# Patient Record
Sex: Male | Born: 1985
Health system: Southern US, Community
[De-identification: ages and names within clinical notes are randomized; demographics above are authoritative.]

## PROBLEM LIST (undated history)

## (undated) DIAGNOSIS — T7840XA Allergy, unspecified, initial encounter: Secondary | ICD-10-CM

## (undated) DIAGNOSIS — R61 Generalized hyperhidrosis: Secondary | ICD-10-CM

## (undated) DIAGNOSIS — F988 Other specified behavioral and emotional disorders with onset usually occurring in childhood and adolescence: Secondary | ICD-10-CM

## (undated) HISTORY — DX: Other specified behavioral and emotional disorders with onset usually occurring in childhood and adolescence: F98.8

## (undated) HISTORY — DX: Generalized hyperhidrosis: R61

## (undated) HISTORY — PX: ADENOIDECTOMY: SUR15

## (undated) HISTORY — DX: Allergy, unspecified, initial encounter: T78.40XA

---
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
## 2008-05-18 ENCOUNTER — Ambulatory Visit: Payer: Self-pay | Admitting: Internal Medicine

## 2008-10-15 ENCOUNTER — Ambulatory Visit: Payer: Self-pay | Admitting: Internal Medicine

## 2008-10-30 ENCOUNTER — Ambulatory Visit: Payer: Self-pay | Admitting: Internal Medicine

## 2008-12-17 ENCOUNTER — Ambulatory Visit: Payer: Self-pay | Admitting: Internal Medicine

## 2009-01-08 ENCOUNTER — Ambulatory Visit: Payer: Self-pay | Admitting: Internal Medicine

## 2009-04-23 ENCOUNTER — Ambulatory Visit: Payer: Self-pay | Admitting: Internal Medicine

## 2009-04-25 ENCOUNTER — Ambulatory Visit: Payer: Self-pay | Admitting: Internal Medicine

## 2009-06-18 ENCOUNTER — Ambulatory Visit: Payer: Self-pay | Admitting: Internal Medicine

## 2009-09-17 ENCOUNTER — Ambulatory Visit: Payer: Self-pay | Admitting: Internal Medicine

## 2009-10-11 ENCOUNTER — Ambulatory Visit: Payer: Self-pay | Admitting: Internal Medicine

## 2010-05-08 ENCOUNTER — Ambulatory Visit: Payer: Self-pay | Admitting: Internal Medicine

## 2010-07-21 ENCOUNTER — Ambulatory Visit: Payer: Self-pay | Admitting: Internal Medicine

## 2011-07-23 ENCOUNTER — Encounter: Payer: Self-pay | Admitting: Internal Medicine

## 2011-07-23 ENCOUNTER — Ambulatory Visit (INDEPENDENT_AMBULATORY_CARE_PROVIDER_SITE_OTHER): Payer: 59 | Admitting: Internal Medicine

## 2011-07-23 VITALS — BP 106/66 | Temp 98.3°F | Ht 73.0 in | Wt 148.0 lb

## 2011-07-23 DIAGNOSIS — H6690 Otitis media, unspecified, unspecified ear: Secondary | ICD-10-CM

## 2011-07-23 DIAGNOSIS — Z23 Encounter for immunization: Secondary | ICD-10-CM

## 2011-07-23 DIAGNOSIS — R61 Generalized hyperhidrosis: Secondary | ICD-10-CM

## 2011-07-23 DIAGNOSIS — L74519 Primary focal hyperhidrosis, unspecified: Secondary | ICD-10-CM

## 2011-07-23 DIAGNOSIS — H6691 Otitis media, unspecified, right ear: Secondary | ICD-10-CM

## 2011-07-23 DIAGNOSIS — J069 Acute upper respiratory infection, unspecified: Secondary | ICD-10-CM

## 2011-08-01 ENCOUNTER — Encounter: Payer: Self-pay | Admitting: Internal Medicine

## 2011-08-01 DIAGNOSIS — R61 Generalized hyperhidrosis: Secondary | ICD-10-CM | POA: Insufficient documentation

## 2011-08-01 NOTE — Progress Notes (Signed)
  Subjective:    Patient ID: Derek Jennings, male    DOB: Feb 03, 1986, 25 y.o.   MRN: [id number]  HPI 25 year old black male in today complaining of URI symptoms and right ear pain. Has been ill for 2 or 3 days. Slight sore throat. No fever or shaking chills. Has not had influenza immunization.    Review of Systems     Objective:   Physical Exam HEENT exam: Right TM is red and full left TM slightly full but not red; pharynx slightly injected; neck supple ; chest clear        Assessment & Plan:  Right otitis media  URI  Plan: Zithromax Z-Pak take 2 by mouth day one followed by 1 by mouth days 2 through 5. Patient also says he's had recent STD check and an urgent care and we will request those results. This was done at [facility] Addendum: Received these results from [facility]. Had negative HIV, Chlamydia and gonorrhea tests

## 2011-08-01 NOTE — Patient Instructions (Signed)
Take Zithromax Z-PAK  2 tablets by mouth day one followed by 1 tablet by mouth days 2 through 5. Call if not better next week or sooner if worse. Influenza immunization given today.

## 2011-08-10 ENCOUNTER — Encounter: Payer: Self-pay | Admitting: Internal Medicine

## 2011-08-13 ENCOUNTER — Ambulatory Visit (INDEPENDENT_AMBULATORY_CARE_PROVIDER_SITE_OTHER): Payer: 59 | Admitting: Internal Medicine

## 2011-08-13 ENCOUNTER — Encounter: Payer: Self-pay | Admitting: Internal Medicine

## 2011-08-13 VITALS — BP 110/74 | HR 76 | Temp 97.8°F | Ht 73.0 in | Wt 150.0 lb

## 2011-08-13 DIAGNOSIS — J069 Acute upper respiratory infection, unspecified: Secondary | ICD-10-CM

## 2011-08-13 DIAGNOSIS — H6690 Otitis media, unspecified, unspecified ear: Secondary | ICD-10-CM

## 2011-08-13 NOTE — Patient Instructions (Signed)
Take Levaquin daily with a meal for 10 days. Call if not better in one week.

## 2011-08-13 NOTE — Progress Notes (Signed)
  Subjective:    Patient ID: Derek Jennings, male    DOB: 1985-10-24, 26 y.o.   MRN: [id number]  HPI 26 year old Black male who was here July 23, 2011 with URI and ear infection. He  was treated with a Zithromax Z-Pak at that time. Says he is not well. Still has nasal congestion and right ear pain. No fever or chills. No sputum production.    Review of Systems noncontributory     Objective:   Physical Exam    HEENT exam: TMs are chronically scarred but not red.    Pharynx is slightly injected without exudate;   Neck is supple without adenopathy;  Chest is clear to auscultation.      Assessment & Plan:  URI-protracted   Bilateral otitis media  Plan: Levaquin 500 milligrams daily for 10 days. He is inquiring about his results from [facility] which were sent to this office. He had negative STD check and negative HIV test done at [facility].

## 2011-08-20 ENCOUNTER — Other Ambulatory Visit: Payer: Self-pay | Admitting: Internal Medicine

## 2011-08-21 ENCOUNTER — Encounter: Payer: Self-pay | Admitting: Internal Medicine

## 2011-09-28 ENCOUNTER — Encounter: Payer: Self-pay | Admitting: Internal Medicine

## 2011-09-28 ENCOUNTER — Ambulatory Visit (INDEPENDENT_AMBULATORY_CARE_PROVIDER_SITE_OTHER): Payer: 59 | Admitting: Internal Medicine

## 2011-09-28 VITALS — BP 116/64 | Temp 98.6°F | Wt 143.5 lb

## 2011-09-28 DIAGNOSIS — H6691 Otitis media, unspecified, right ear: Secondary | ICD-10-CM

## 2011-09-28 DIAGNOSIS — H6690 Otitis media, unspecified, unspecified ear: Secondary | ICD-10-CM

## 2011-09-28 NOTE — Patient Instructions (Addendum)
Take Levaquin 500 milligrams daily for 10 days. Call if not better in 7-10 days or sooner if worse. 

## 2011-09-28 NOTE — Progress Notes (Signed)
  Subjective:    Patient ID: Derek Jennings, male    DOB: Nov 16, 1985, 26 y.o.   MRN: [id number]  HPI  Patient was here in December with ear infection and was treated with Zithromax Z-Pak. He returned on January 10 with persistent symptoms. He was treated with Levaquin 500 mg daily for 10 days. He did get better. Apparently came down with recurrent symptoms on February 23. Has had pain in left ear. No sore throat. Has had some postnasal drip. History of allergic rhinitis and recurrent ear infections since childhood. No fever or shaking chills. Not much cough. Has been taking Mucinex over-the-counter.    Review of Systems     Objective:   Physical Exam Right TM is pink, full and dull with splayed light reflex. Left TM is full but not red. Pharynx is clear. Neck is supple without adenopathy. Chest clear to auscultation.        Assessment & Plan:  Right otitis media  URI  Plan: Levaquin 500 milligrams daily for 10 days.

## 2011-12-03 ENCOUNTER — Ambulatory Visit: Payer: 59 | Admitting: Internal Medicine

## 2011-12-10 ENCOUNTER — Ambulatory Visit: Payer: 59 | Admitting: Internal Medicine

## 2011-12-11 ENCOUNTER — Encounter: Payer: Self-pay | Admitting: Internal Medicine

## 2011-12-11 ENCOUNTER — Ambulatory Visit (INDEPENDENT_AMBULATORY_CARE_PROVIDER_SITE_OTHER): Payer: 59 | Admitting: Internal Medicine

## 2011-12-11 DIAGNOSIS — K645 Perianal venous thrombosis: Secondary | ICD-10-CM

## 2011-12-11 DIAGNOSIS — H1045 Other chronic allergic conjunctivitis: Secondary | ICD-10-CM

## 2011-12-11 DIAGNOSIS — J309 Allergic rhinitis, unspecified: Secondary | ICD-10-CM | POA: Insufficient documentation

## 2011-12-11 DIAGNOSIS — H1010 Acute atopic conjunctivitis, unspecified eye: Secondary | ICD-10-CM

## 2011-12-11 NOTE — Patient Instructions (Addendum)
Use ProctoCream and rectal area 4 times a day. Take sitz baths daily for 20 minutes. You have been prescribed a six-day Sterapred DS dosepak for allergy symptoms.

## 2011-12-11 NOTE — Progress Notes (Signed)
  Subjective:    Patient ID: Derek Jennings, male    DOB: [birth year], 24 y.o.   MRN: [id number]  HPI 26 year old black male complaining of allergic rhinitis symptoms. No ear pain. Just stuffy nose. Has itchy eyes as well. Also has another problem today. Has felt a lump in his rectal area. Thinks it may be an external hemorrhoid. Was a bit constipated and strained that the stool a few days ago and this developed. No bleeding from rectal area.    Review of Systems     Objective:   Physical Exam he has very boggy nasal mucosa bilaterally. TMs are clear. Neck is supple without adenopathy. Chest clear. Rectal exam: Has a prominent soft external hemorrhoid that is not painful to touch        Assessment & Plan:  Allergic rhinitis  External hemorrhoid  Plan: ProctoCream-HC to use in the rectal area 4 times a day along with sitz baths daily for 20 minutes  Sterapred DS 10 mg 6 day dosepak for allergic rhinitis symptoms.

## 2012-03-22 ENCOUNTER — Encounter: Payer: Self-pay | Admitting: Internal Medicine

## 2012-03-22 ENCOUNTER — Ambulatory Visit (INDEPENDENT_AMBULATORY_CARE_PROVIDER_SITE_OTHER): Payer: 59 | Admitting: Internal Medicine

## 2012-03-22 ENCOUNTER — Encounter (INDEPENDENT_AMBULATORY_CARE_PROVIDER_SITE_OTHER): Payer: Self-pay | Admitting: Surgery

## 2012-03-22 ENCOUNTER — Ambulatory Visit (INDEPENDENT_AMBULATORY_CARE_PROVIDER_SITE_OTHER): Payer: 59 | Admitting: Surgery

## 2012-03-22 VITALS — BP 102/60 | HR 64 | Temp 97.6°F | Resp 16 | Ht 74.0 in | Wt 150.6 lb

## 2012-03-22 VITALS — BP 118/78 | Temp 98.0°F | Ht 73.0 in | Wt 152.0 lb

## 2012-03-22 DIAGNOSIS — K645 Perianal venous thrombosis: Secondary | ICD-10-CM

## 2012-03-22 MED ORDER — HYDROCODONE-ACETAMINOPHEN 5-325 MG PO TABS: 1.0000 | ORAL_TABLET | Freq: Four times a day (QID) | ORAL | Status: AC | PRN

## 2012-03-22 NOTE — Progress Notes (Signed)
  Subjective:    Patient ID: Derek Jennings, male    DOB: 07-09-1986, 26 y.o.   MRN: [id number]  HPI 26 year old black male who presented here in May with external hemorrhoid that was nonthrombosed. He was treated with ProctoCream-HC. 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 On Sunday he was on his feet for over 8 hours. When he returned to his car he noticed some tenderness in his rectal area. He has not been constipated. Yesterday he noticed a small protrusion in the rectal area and started applying ProctoCream-HC. Says it has improved just a bit. Has not done sitz baths yet.    Review of Systems     Objective:   Physical Exam he is a large thrombosed hemorrhoid in the rectal area that is tender.        Assessment & Plan:  Thrombosed hemorrhoid  Plan: Refer to surgeon for incision and drainage. Appointment with [facility] this afternoon

## 2012-03-22 NOTE — Progress Notes (Signed)
Patient ID: Derek Jennings, male   DOB: 05-10-1986, 26 y.o.   MRN: [id number]  Chief Complaint  Patient presents with  . Rectal Problems    thrombosed hems    HPI Derek Jennings is a 26 y.o. male.  Pt sent at the request of Dr Lenord Fellers for thrombosed external hemorrhoids.  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 HPI  Past Medical History  Diagnosis Date  . Allergy   . ADD (attention deficit disorder)   . Hyperhidrosis     Past Surgical History  Procedure Date  . Adenoidectomy     Family History  Problem Relation Age of Onset  . Heart disease Father     Social History History  Substance Use Topics  . Smoking status: Current Everyday Smoker -- 0.2 packs/day  . Smokeless tobacco: Former Neurosurgeon  . Alcohol Use: Yes     socially    Allergies  Allergen Reactions  . Amoxicillin Hives    Current Outpatient Prescriptions  Medication Sig Dispense Refill  . cetirizine (ZYRTEC) 10 MG tablet Take 10 mg by mouth daily. Prn only      . glycopyrrolate (ROBINUL) 2 MG tablet Take 2 mg by mouth daily.        Marland Kitchen guaiFENesin (MUCINEX) 600 MG 12 hr tablet Take 1,200 mg by mouth 2 (two) times daily.      Marland Kitchen loratadine (CLARITIN) 10 MG tablet Take 10 mg by mouth daily.      . pramoxine-hydrocortisone (PROCTOCREAM-HC) 1-1 % rectal cream Place rectally 2 (two) times daily.      Marland Kitchen HYDROcodone-acetaminophen (NORCO) 5-325 MG per tablet Take 1 tablet by mouth every 6 (six) hours as needed for pain.  30 tablet  0    Review of Systems Review of Systems  Constitutional: Negative.   HENT: Negative.   Gastrointestinal: Positive for vomiting and anal bleeding. Negative for constipation.  Musculoskeletal: Negative.   Skin: Negative.     Blood pressure 102/60, pulse 64, temperature 97.6 F (36.4 C), temperature source Temporal, resp. rate 16, height 6\' 2"  (1.88 m), weight 150 lb 9.6 oz (68.312 kg).  Physical Exam Physical Exam    Constitutional: He appears well-developed and well-nourished.  HENT:  Head: Normocephalic and atraumatic.  Neck: Normal range of motion. Neck supple.  Genitourinary:         Assessment    Thrombosed external hemorrhoids    Plan    Discussed medical vs surgical management.  Risks,  Pros and cons of each discussed.  He agrees t proceed with excision in the office.  Risks include bleeding,   infection,  More surgery,  Organ and neighboring structure injury.The patient agrees to proceed.  Using betadine as prep,  The anus was prepped in a sterile fashion.  1% LIDOCAINE WITH EPINEPHRINE WAS USED.  This was injected around the complex.  The complex was located in the right lateral position.  The entire complex was excised.  Hemostasis was achieved.  Dry dressing applied.  Pt tolerated procedure well.  Post procedure instructions given and script for norco # 30 given.  Questions answered.  Return as needed.       Abimael Zeiter A. 03/22/2012, 3:45 PM

## 2012-03-22 NOTE — Patient Instructions (Signed)
Hemorrhoidectomy Care After Hemorrhoidectomy is the removal of enlarged (dilated) veins around the rectum. Until the surgical areas are healed, control of pain and avoiding constipation are the greatest challenges for patients.  For as long as 24 hours after receiving an anesthetic (the medication that made you sleep), and while taking narcotic pain relievers, you may feel dizzy, weak and drowsy. For that reason, the following information applies to the first 24-hour period following surgery, and continues for as long as you are taking narcotic pain medications.  Do not drive a car, ride a bicycle, participate in activities in which you could be hurt. Do not take public transportation until you are off narcotic pain medications and until your caregiver says it is okay.   Do not drink alcohol, take tranquilizers, or medications not prescribed or allowed by your surgical caregiver.   Do not sign important papers or contracts for at least 24 hours or while taking narcotic medications.   Have a responsible person with you for 24 hours.  RISKS AND COMPLICATIONS Some problems that may occur following this procedure include:  Infection. A germ starts growing in the tissue surrounding the site operated on. This can usually be treated with antibiotics.   Damage to the rectal sphincter could occur. This is the muscle that opens in your anus to allow a bowel movement. This could cause incontinence. This is uncommon.   Bleeding following surgery can be a complication of almost any surgery. Your surgeon takes every precaution to keep this from happening.   Complications of anesthesia.  HOME CARE INSTRUCTIONS  Avoid straining when having bowel movements.   Avoid heavy lifting (more than 10 pounds (4.5 kilograms)).   Only take over-the-counter or prescription medicines for pain, discomfort, or fever as directed by your caregiver.   Take hot sitz baths for 20 to 30 minutes, 3 to 4 times per day.   To  keep swelling down, apply an ice pack for twenty minutes three to four times per day between sitz baths. Use a towel between your skin and the ice pack. Do not do this if it causes too much discomfort.   Keep anal area clean and dry. Following a bowel movement, you can gently wash the area with tucks (available for purchase at a drugstore) or cotton swabs. Gently pat the area dry. Do not rub the area.   Eat a well balanced diet and drink 6 to 8 glasses of water every day to avoid constipation. A bulk laxative may be also be helpful.  SEEK MEDICAL CARE IF:   You have increasing pain or tenderness near or in the surgical site.   You are unable to eat or drink.   You develop nausea or vomiting.   You develop uncontrolled bleeding such as soaking two to three pads in one hour.   You have constipation, not helped by changing your diet or increasing your fluid intake. Pain medications are a common cause of constipation.   You have pain and redness (inflammation) extending outside the area of your surgery.   You develop an unexplained oral temperature above 102 F (38.9 C), or any other signs of infection.   You have any other questions or concerns following surgery.  Document Released: 10/10/2003 Document Revised: 07/09/2011 Document Reviewed: 01/07/2009 ExitCare Patient Information 2012 [employer]. 

## 2012-03-22 NOTE — Patient Instructions (Addendum)
Go to [facility] this afternoon to have hemorrhoid incised.

## 2012-04-12 ENCOUNTER — Ambulatory Visit (INDEPENDENT_AMBULATORY_CARE_PROVIDER_SITE_OTHER): Payer: 59 | Admitting: Internal Medicine

## 2012-04-12 ENCOUNTER — Encounter: Payer: Self-pay | Admitting: Internal Medicine

## 2012-04-12 VITALS — BP 100/72 | Temp 98.9°F | Ht 74.0 in | Wt 152.0 lb

## 2012-04-12 DIAGNOSIS — H6690 Otitis media, unspecified, unspecified ear: Secondary | ICD-10-CM

## 2012-04-12 DIAGNOSIS — J069 Acute upper respiratory infection, unspecified: Secondary | ICD-10-CM

## 2012-04-12 DIAGNOSIS — H6691 Otitis media, unspecified, right ear: Secondary | ICD-10-CM

## 2012-04-29 NOTE — Progress Notes (Signed)
  Subjective:    Patient ID: [name], male    DOB: 1985/12/06, 26 y.o.   MRN: [id number]  HPI 26 year old black male with history of allergic rhinitis in today complaining of right ear pain and sinus congestion. No fever or chills. No sore throat. History of frequent ear infections since childhood.    Review of Systems     Objective:   Physical Exam HEENT exam reveals a right otitis media. Left TM is full but not red. Right TM full thickened and dull. Pharynx is clear. Neck is supple. Chest clear.           Assessment & Plan:  Right otitis media  URI  Plan: Levaquin 500 milligrams daily for 7 days.

## 2012-04-29 NOTE — Patient Instructions (Addendum)
Take Levaquin 500 milligrams daily for 7 days. Call if not better in 7 days 

## 2012-06-09 ENCOUNTER — Ambulatory Visit: Payer: 59 | Admitting: Internal Medicine

## 2012-06-16 ENCOUNTER — Ambulatory Visit (INDEPENDENT_AMBULATORY_CARE_PROVIDER_SITE_OTHER): Payer: 59 | Admitting: Internal Medicine

## 2012-06-16 ENCOUNTER — Encounter: Payer: Self-pay | Admitting: Internal Medicine

## 2012-06-16 VITALS — BP 98/62 | HR 80 | Temp 97.1°F | Wt 159.0 lb

## 2012-06-16 DIAGNOSIS — H6690 Otitis media, unspecified, unspecified ear: Secondary | ICD-10-CM

## 2012-06-16 DIAGNOSIS — H6691 Otitis media, unspecified, right ear: Secondary | ICD-10-CM

## 2012-06-16 DIAGNOSIS — J069 Acute upper respiratory infection, unspecified: Secondary | ICD-10-CM

## 2012-06-16 NOTE — Patient Instructions (Addendum)
Take Levaquin 500 milligrams daily for 7 days. Call if not better in one week or sooner if worse

## 2012-06-16 NOTE — Progress Notes (Signed)
  Subjective:    Patient ID: Derek Jennings, male    DOB: April 02, 1986, 26 y.o.   MRN: [id number]  HPI One-week history of URI symptoms with sore throat and ear pain. No fever or shaking chills. Says right ear hurts.    Review of Systems     Objective:   Physical Exam HEENT exam: Pharynx is injected without exudate. Right TM is full and pain. Left TM is clear. Neck is supple without adenopathy. Chest clear. Skin is warm and dry.        Assessment & Plan:  Acute URI  Otitis media (right)  Plan: Levaquin 500 milligrams daily for 7 days. He was treated with same medication 2 months ago for similar illness. History of recurrent ear infections.

## 2014-02-08 ENCOUNTER — Ambulatory Visit (INDEPENDENT_AMBULATORY_CARE_PROVIDER_SITE_OTHER): Payer: 59 | Admitting: Internal Medicine

## 2014-02-08 ENCOUNTER — Encounter: Payer: Self-pay | Admitting: Internal Medicine

## 2014-02-08 DIAGNOSIS — S8002XA Contusion of left knee, initial encounter: Secondary | ICD-10-CM

## 2014-02-08 DIAGNOSIS — R079 Chest pain, unspecified: Secondary | ICD-10-CM

## 2014-02-08 DIAGNOSIS — R0789 Other chest pain: Secondary | ICD-10-CM

## 2014-02-08 DIAGNOSIS — S8001XA Contusion of right knee, initial encounter: Secondary | ICD-10-CM

## 2014-02-08 DIAGNOSIS — S8000XA Contusion of unspecified knee, initial encounter: Secondary | ICD-10-CM

## 2014-02-08 DIAGNOSIS — M25569 Pain in unspecified knee: Secondary | ICD-10-CM

## 2014-02-08 DIAGNOSIS — M25562 Pain in left knee: Secondary | ICD-10-CM

## 2014-02-08 DIAGNOSIS — M25561 Pain in right knee: Secondary | ICD-10-CM

## 2014-02-08 MED ORDER — IBUPROFEN 600 MG PO TABS: 600.0000 mg | ORAL_TABLET | Freq: Three times a day (TID) | ORAL | Status: DC | PRN
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
## 2014-02-08 NOTE — Patient Instructions (Signed)
Have x-rays of left knee and sternum. Take ibuprofen 600 mg 3 times a day for 2-3 weeks. Apply ice to knees 20 minutes twice daily.

## 2014-02-09 ENCOUNTER — Ambulatory Visit
Admission: RE | Admit: 2014-02-09 | Discharge: 2014-02-09 | Disposition: A | Discharge status: Home / Self Care | Payer: 59 | Source: Ambulatory Visit | Attending: Internal Medicine | Admitting: Internal Medicine

## 2014-02-09 DIAGNOSIS — R0789 Other chest pain: Secondary | ICD-10-CM

## 2014-02-09 DIAGNOSIS — S8002XA Contusion of left knee, initial encounter: Secondary | ICD-10-CM

## 2014-05-30 ENCOUNTER — Encounter: Payer: Self-pay | Admitting: Internal Medicine

## 2014-05-30 ENCOUNTER — Ambulatory Visit (INDEPENDENT_AMBULATORY_CARE_PROVIDER_SITE_OTHER): Payer: 59 | Admitting: Internal Medicine

## 2014-05-30 VITALS — BP 98/60 | HR 100 | Temp 98.4°F | Wt 165.0 lb

## 2014-05-30 DIAGNOSIS — Z23 Encounter for immunization: Secondary | ICD-10-CM

## 2014-05-30 DIAGNOSIS — M546 Pain in thoracic spine: Secondary | ICD-10-CM

## 2014-05-30 DIAGNOSIS — L02232 Carbuncle of back [any part, except buttock]: Secondary | ICD-10-CM

## 2014-05-30 MED ORDER — DOXYCYCLINE HYCLATE 100 MG PO TABS: 100.0000 mg | ORAL_TABLET | Freq: Two times a day (BID) | ORAL | Status: DC

## 2014-05-30 MED ORDER — MELOXICAM 15 MG PO TABS: 15.0000 mg | ORAL_TABLET | Freq: Every day | ORAL | Status: DC

## 2014-05-30 NOTE — Progress Notes (Signed)
   Subjective:    Patient ID: Derek Jennings, male    DOB: 1985-11-07, 28 y.o.   MRN: [id number]  HPI  28 year old Black male  with left back pain lower rib cage. Also has carbuncle in same area.    Review of Systems     Objective:   Physical Exam  Tender below left scapula. Small carbuncle in same area      Assessment & Plan:  Parathoracic back strain-left  Carbuncle  Plan: Doxycycline 100 mg twice daily for 10 days for carbuncle. Mobitz 15 mg daily for 10 days 2 weeks for back pain. Flu vaccine given.  15 minutes spent with patient

## 2014-05-30 NOTE — Patient Instructions (Addendum)
Take Doxycycline 100 mg twice a day for 10 days. Take Mobic  Daily for 2 weeks. Flu vaccine given

## 2015-01-01 ENCOUNTER — Other Ambulatory Visit: Payer: Self-pay | Admitting: Internal Medicine

## 2015-01-03 ENCOUNTER — Encounter: Payer: Self-pay | Admitting: Internal Medicine

## 2015-01-04 ENCOUNTER — Other Ambulatory Visit: Payer: Self-pay | Admitting: Internal Medicine

## 2015-01-04 ENCOUNTER — Other Ambulatory Visit: Payer: 59 | Admitting: Internal Medicine

## 2015-01-04 DIAGNOSIS — Z130 Encounter for screening for diseases of the blood and blood-forming organs and certain disorders involving the immune mechanism: Secondary | ICD-10-CM

## 2015-01-04 DIAGNOSIS — Z0000 Encounter for general adult medical examination without abnormal findings: Secondary | ICD-10-CM

## 2015-01-04 DIAGNOSIS — Z13220 Encounter for screening for lipoid disorders: Secondary | ICD-10-CM

## 2015-01-04 LAB — COMPLETE METABOLIC PANEL WITH GFR
ALT: 8 U/L (ref 0–53)
AST: 11 U/L (ref 0–37)
Albumin: 4 g/dL (ref 3.5–5.2)
Alkaline Phosphatase: 43 U/L (ref 39–117)
BUN: 9 mg/dL (ref 6–23)
CO2: 26 mEq/L (ref 19–32)
Calcium: 8.9 mg/dL (ref 8.4–10.5)
Chloride: 108 mEq/L (ref 96–112)
Creat: 0.78 mg/dL (ref 0.50–1.35)
GFR, Est African American: >89 mL/min
GFR, Est Non African American: >89 mL/min
Glucose, Bld: 84 mg/dL (ref 70–99)
Potassium: 4.3 mEq/L (ref 3.5–5.3)
Sodium: 141 mEq/L (ref 135–145)
Total Bilirubin: 0.4 mg/dL (ref 0.2–1.2)
Total Protein: 6.3 g/dL (ref 6.0–8.3)

## 2015-01-04 LAB — LIPID PANEL
Cholesterol: 155 mg/dL (ref 0–200)
HDL: 53 mg/dL (ref >=40)
LDL Cholesterol: 93 mg/dL (ref 0–99)
Total CHOL/HDL Ratio: 2.9 Ratio
Triglycerides: 44 mg/dL (ref <150)
VLDL: 9 mg/dL (ref 0–40)

## 2015-01-04 LAB — CBC WITH DIFFERENTIAL/PLATELET
Basophils Absolute: 0 10*3/uL (ref 0.0–0.1)
Basophils Relative: 1 % (ref 0–1)
Eosinophils Absolute: 0 10*3/uL (ref 0.0–0.7)
Eosinophils Relative: 1 % (ref 0–5)
HCT: 41 % (ref 39.0–52.0)
Hemoglobin: 13.8 g/dL (ref 13.0–17.0)
Lymphocytes Relative: 31 % (ref 12–46)
Lymphs Abs: 1.3 10*3/uL (ref 0.7–4.0)
MCH: 30.9 pg (ref 26.0–34.0)
MCHC: 33.7 g/dL (ref 30.0–36.0)
MCV: 91.9 fL (ref 78.0–100.0)
MPV: 9 fL (ref 8.6–12.4)
Monocytes Absolute: 0.3 10*3/uL (ref 0.1–1.0)
Monocytes Relative: 8 % (ref 3–12)
Neutro Abs: 2.5 10*3/uL (ref 1.7–7.7)
Neutrophils Relative %: 59 % (ref 43–77)
Platelets: 254 10*3/uL (ref 150–400)
RBC: 4.46 MIL/uL (ref 4.22–5.81)
RDW: 13.8 % (ref 11.5–15.5)
WBC: 4.3 10*3/uL (ref 4.0–10.5)

## 2015-01-07 ENCOUNTER — Ambulatory Visit (INDEPENDENT_AMBULATORY_CARE_PROVIDER_SITE_OTHER): Payer: 59 | Admitting: Internal Medicine

## 2015-01-07 VITALS — BP 118/64 | HR 60 | Temp 98.1°F | Ht 74.0 in | Wt 158.5 lb

## 2015-01-07 DIAGNOSIS — J309 Allergic rhinitis, unspecified: Secondary | ICD-10-CM

## 2015-01-07 DIAGNOSIS — Z8669 Personal history of other diseases of the nervous system and sense organs: Secondary | ICD-10-CM

## 2015-01-07 DIAGNOSIS — Z0000 Encounter for general adult medical examination without abnormal findings: Secondary | ICD-10-CM

## 2015-01-07 DIAGNOSIS — Z113 Encounter for screening for infections with a predominantly sexual mode of transmission: Secondary | ICD-10-CM | POA: Diagnosis not present

## 2015-01-07 LAB — POCT URINALYSIS DIPSTICK
Bilirubin, UA: NEGATIVE
Blood, UA: NEGATIVE
Glucose, UA: NEGATIVE
Leukocytes, UA: NEGATIVE
Nitrite, UA: NEGATIVE
Protein, UA: NEGATIVE
Spec Grav, UA: >=1.03
Urobilinogen, UA: NEGATIVE
pH, UA: 6

## 2015-01-08 LAB — HEPATITIS C ANTIBODY: HCV Ab: NEGATIVE

## 2015-01-08 LAB — GC/CHLAMYDIA PROBE AMP, URINE
Chlamydia, Swab/Urine, PCR: NEGATIVE
GC Probe Amp, Urine: NEGATIVE

## 2015-01-08 LAB — HIV ANTIBODY (ROUTINE TESTING W REFLEX): HIV 1&2 Ab, 4th Generation: NONREACTIVE

## 2015-01-08 NOTE — Progress Notes (Signed)
   Subjective:    Patient ID: Derek Jennings, male    DOB: [birth year], 29 y.o.   MRN: [id number]  HPI 29 year old Black Male in today for health maintenance exam and evaluation of medical issues. History of allergic rhinitis. History of recurrent ear infections.  Tells me today that he had sexual intercourse with a male several months ago and he recently heard she had syphilis. He wants to be checked for all STDs.  Social history: He is currently enrolled at [employer] in business. 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 Hopes to get a Masters degree in management. He is also working at [employer] where his mother works. 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 Nonsmoker. Social alcohol consumption  Family history: Mother with history of migraine headaches. Father with history of stroke and atrial fibrillation.      Review of Systems  Constitutional: Negative.   Eyes: Negative.   Respiratory: Negative.   Cardiovascular: Negative.   Gastrointestinal: Negative.   Genitourinary:       No discharge from penis. No dysuria  Allergic/Immunologic: Positive for environmental allergies.  Neurological: Negative.   Hematological: Negative.   Psychiatric/Behavioral: Negative.        Objective:   Physical Exam  Constitutional: He is oriented to person, place, and time. He appears well-developed and well-nourished. No distress.  HENT:  Head: Normocephalic and atraumatic.  Right Ear: External ear normal.  Left Ear: External ear normal.  Mouth/Throat: Oropharynx is clear and moist. No oropharyngeal exudate.  Eyes: Conjunctivae and EOM are normal. Pupils are equal, round, and reactive to light. Right eye exhibits no discharge. Left eye exhibits no discharge. No scleral icterus.  Neck: Neck supple. No JVD present. No thyromegaly present.  Cardiovascular: Normal rate, regular rhythm and normal heart sounds.   No murmur heard. Pulmonary/Chest:  Effort normal and breath sounds normal. No respiratory distress. He has no wheezes. He has no rales. He exhibits no tenderness.  Abdominal: Soft. Bowel sounds are normal. He exhibits no distension and no mass. There is no tenderness. There is no rebound and no guarding.  Genitourinary: Penis normal.  No hernias  Musculoskeletal: He exhibits no edema.  Lymphadenopathy:    He has no cervical adenopathy.  Neurological: He is alert and oriented to person, place, and time. He has normal reflexes. No cranial nerve deficit.  Skin: Skin is warm and dry. No rash noted. He is not diaphoretic.  Psychiatric: He has a normal mood and affect. His behavior is normal. Judgment and thought content normal.  Vitals reviewed.         Assessment & Plan:  Normal health maintenance exam  Encounter to rule out STDs  History of allergic rhinitis  History of recurrent ear infections  Plan: Check for GC, syphilis, chlamydia, HIV. Fasting labs drawn and are pending. Counsel regarding condom use.  Addendum: STD check is negative. Patient informed.

## 2015-01-09 ENCOUNTER — Telehealth: Payer: Self-pay | Admitting: *Deleted

## 2015-01-09 DIAGNOSIS — L80 Vitiligo: Secondary | ICD-10-CM

## 2015-01-09 LAB — RPR

## 2015-01-09 NOTE — Telephone Encounter (Signed)
Scheduled patient to see Dr [facility]ean [facility] August 29th at 10:00am .

## 2015-01-09 NOTE — Telephone Encounter (Signed)
Reviewed lab results with patient.

## 2015-01-09 NOTE — Telephone Encounter (Signed)
Left message for patient to call back  

## 2015-01-10 ENCOUNTER — Telehealth: Payer: Self-pay | Admitting: *Deleted

## 2015-01-10 NOTE — Telephone Encounter (Signed)
Left message for patient to call back regarding lab results and Dermatology appt

## 2015-01-27 ENCOUNTER — Encounter: Payer: Self-pay | Admitting: Internal Medicine

## 2015-01-27 NOTE — Patient Instructions (Signed)
Return in 1-2 years or as needed. Use condoms with sexual intercourse.

## 2015-06-20 ENCOUNTER — Ambulatory Visit (INDEPENDENT_AMBULATORY_CARE_PROVIDER_SITE_OTHER): Payer: 59 | Admitting: Internal Medicine

## 2015-06-20 ENCOUNTER — Other Ambulatory Visit: Payer: Self-pay | Admitting: Internal Medicine

## 2015-06-20 ENCOUNTER — Encounter: Payer: Self-pay | Admitting: Internal Medicine

## 2015-06-20 VITALS — BP 108/80 | HR 120 | Temp 100.4°F | Resp 18 | Ht 74.0 in | Wt 145.0 lb

## 2015-06-20 DIAGNOSIS — R112 Nausea with vomiting, unspecified: Secondary | ICD-10-CM | POA: Diagnosis not present

## 2015-06-20 DIAGNOSIS — J029 Acute pharyngitis, unspecified: Secondary | ICD-10-CM

## 2015-06-20 DIAGNOSIS — K529 Noninfective gastroenteritis and colitis, unspecified: Secondary | ICD-10-CM

## 2015-06-20 DIAGNOSIS — R8290 Unspecified abnormal findings in urine: Secondary | ICD-10-CM

## 2015-06-20 DIAGNOSIS — M545 Low back pain: Secondary | ICD-10-CM

## 2015-06-20 LAB — CBC WITH DIFFERENTIAL/PLATELET
Basophils Absolute: 0 10*3/uL (ref 0.0–0.1)
Basophils Relative: 0 % (ref 0–1)
Eosinophils Absolute: 0 10*3/uL (ref 0.0–0.7)
Eosinophils Relative: 0 % (ref 0–5)
HCT: 39.5 % (ref 39.0–52.0)
Hemoglobin: 13.6 g/dL (ref 13.0–17.0)
Lymphocytes Relative: 12 % (ref 12–46)
Lymphs Abs: 0.9 10*3/uL (ref 0.7–4.0)
MCH: 31.8 pg (ref 26.0–34.0)
MCHC: 34.4 g/dL (ref 30.0–36.0)
MCV: 92.3 fL (ref 78.0–100.0)
MPV: 9.9 fL (ref 8.6–12.4)
Monocytes Absolute: 1.1 10*3/uL — ABNORMAL HIGH (ref 0.1–1.0)
Monocytes Relative: 15 % — ABNORMAL HIGH (ref 3–12)
Neutro Abs: 5.3 10*3/uL (ref 1.7–7.7)
Neutrophils Relative %: 73 % (ref 43–77)
Platelets: 246 10*3/uL (ref 150–400)
RBC: 4.28 MIL/uL (ref 4.22–5.81)
RDW: 12.4 % (ref 11.5–15.5)
WBC: 7.2 10*3/uL (ref 4.0–10.5)

## 2015-06-20 LAB — POCT URINALYSIS DIPSTICK
Glucose, UA: NEGATIVE
Leukocytes, UA: NEGATIVE
Nitrite, UA: NEGATIVE
Spec Grav, UA: >=1.03
Urobilinogen, UA: 4
pH, UA: 7

## 2015-06-20 MED ORDER — AZITHROMYCIN 250 MG PO TABS: ORAL_TABLET | ORAL | Status: DC

## 2015-06-20 MED ORDER — ONDANSETRON HCL 4 MG/2ML IJ SOLN
4.0000 mg | Freq: Once | INTRAMUSCULAR | Status: AC
  Administered 2015-06-20: 4 mg via INTRAMUSCULAR

## 2015-06-20 MED ORDER — PROMETHAZINE HCL 25 MG PO TABS
25.0000 mg | ORAL_TABLET | Freq: Four times a day (QID) | ORAL | Status: DC | PRN
Start: 2015-06-20 — End: 2018-12-15

## 2015-06-20 NOTE — Patient Instructions (Signed)
You need to force fluids over the next 24 hours. Stay away from milk water and arched use. You may drink soda tea or Gatorade. Lab work is pending. Take Zithromax Z-PAK as directed. Take Phenergan as needed for nausea. Note to be out of work Sunday and Monday.

## 2015-06-21 ENCOUNTER — Telehealth: Payer: Self-pay | Admitting: Internal Medicine

## 2015-06-21 LAB — COMPLETE METABOLIC PANEL WITH GFR
ALT: 9 U/L (ref 9–46)
AST: 13 U/L (ref 10–40)
Albumin: 3.8 g/dL (ref 3.6–5.1)
Alkaline Phosphatase: 54 U/L (ref 40–115)
BUN: 8 mg/dL (ref 7–25)
CO2: 25 mmol/L (ref 20–31)
Calcium: 9 mg/dL (ref 8.6–10.3)
Chloride: 100 mmol/L (ref 98–110)
Creat: 0.76 mg/dL (ref 0.60–1.35)
GFR, Est African American: >89 mL/min (ref >=60)
GFR, Est Non African American: >89 mL/min (ref >=60)
Glucose, Bld: 89 mg/dL (ref 65–99)
Potassium: 3.6 mmol/L (ref 3.5–5.3)
Sodium: 138 mmol/L (ref 135–146)
Total Bilirubin: 0.4 mg/dL (ref 0.2–1.2)
Total Protein: 6.4 g/dL (ref 6.1–8.1)

## 2015-06-21 LAB — HEPATITIS A ANTIBODY, IGM: Hep A IgM: NONREACTIVE

## 2015-06-21 LAB — MONONUCLEOSIS SCREEN: Heterophile, Mono Screen: NEGATIVE

## 2015-06-21 NOTE — Telephone Encounter (Signed)
Called patient to check up on his status from 11/17 visit.  States he's feeling about the same.  Still has "some vomiting".  Had a "slight fever" this a.m.  Advised patient that we are still awaiting some lab results.  Advised patient to remain on clear liquids and crackers.  No solid foods as long as he is vomiting.  If the vomiting subsides, patient advised he may advance diet slowly to solid foods, but ONLY if the vomiting stops.  Patient instructed to call the office on Monday to update us on his condition.    Patient does advise that his employer did NOT accept our work note to keep him out of work on Sunday and Monday.  They have a "no fault policy".  Once they reach 6 points, they are terminated.  They suggested that he seek FMLA.  I advised patient that if this is a "virus", he will not qualify for FMLA by definition.  Advised patient that he should speak to his HR department at work.  Also advised I would share the information with Dr. Lenord FellersBaxley.  However, the bigger issue is that he will not qualify for FMLA for this condition.   He may need to return to work.

## 2015-06-25 ENCOUNTER — Encounter: Payer: Self-pay | Admitting: Internal Medicine

## 2015-06-25 ENCOUNTER — Ambulatory Visit (INDEPENDENT_AMBULATORY_CARE_PROVIDER_SITE_OTHER): Payer: 59 | Admitting: Internal Medicine

## 2015-06-25 VITALS — BP 108/70 | HR 94 | Temp 98.0°F | Resp 20 | Wt 142.0 lb

## 2015-06-25 DIAGNOSIS — E869 Volume depletion, unspecified: Secondary | ICD-10-CM | POA: Diagnosis not present

## 2015-06-25 DIAGNOSIS — R8290 Unspecified abnormal findings in urine: Secondary | ICD-10-CM

## 2015-06-25 DIAGNOSIS — R112 Nausea with vomiting, unspecified: Secondary | ICD-10-CM

## 2015-06-25 DIAGNOSIS — J029 Acute pharyngitis, unspecified: Secondary | ICD-10-CM

## 2015-06-25 LAB — POCT URINALYSIS DIPSTICK
Bilirubin, UA: NEGATIVE
Glucose, UA: NEGATIVE
Ketones, UA: NEGATIVE
Leukocytes, UA: NEGATIVE
Nitrite, UA: NEGATIVE
Spec Grav, UA: 1.02
Urobilinogen, UA: 0.2
pH, UA: 7

## 2015-06-25 MED ORDER — CEFTRIAXONE SODIUM 1 G IJ SOLR
1.0000 g | INTRAMUSCULAR | Status: AC
  Administered 2015-06-25: 1 g via INTRAMUSCULAR

## 2015-06-25 MED ORDER — CEFTRIAXONE SODIUM 1 G IJ SOLR: 1.0000 g | INTRAMUSCULAR | Status: DC

## 2015-06-25 NOTE — Progress Notes (Signed)
   Subjective:    Patient ID: Derek Jennings, male    DOB: June 24, 1986, 29 y.o.   MRN: [id number]  HPI Is here today to follow-up on right ear infection and pharyngitis. He also had a dark urine that had an orange tint to it at last visit. I think at the time he was volume depleted. Urine dipstick was abnormal. Please see results. He's finished Z-Pak. He still has sore throat. Ears are improving. He had a right otitis media. We did lab work including hepatitis A antibody which proved to be negative. Monospot test was negative. He had eaten another person's plate of food when he became ill. No diarrhea just complained of vague abdominal pain and nausea. Main complaint today is sore throat. He's worried about his ears as well. He is now able to eat soup and crackers. Probably not drinking enough fluid yet. Specific gravity on urine dipstick is 1010    Review of Systems as above     Objective:   Physical Exam Right TM improving. Pharynx slightly injected. Neck is supple. Chest clear. He is looking less lethargic and more well hydrated.       Assessment & Plan:  Right otitis media  Pharyngitis  Gastroenteritis  Plan: Continue forcing fluids. Continue with clear liquids and advance diet slowly.

## 2015-06-26 LAB — URINALYSIS, MICROSCOPIC ONLY
Bacteria, UA: NONE SEEN [HPF]
Casts: NONE SEEN [LPF]
Crystals: NONE SEEN [HPF]
Yeast: NONE SEEN [HPF]

## 2015-06-26 LAB — URINALYSIS, ROUTINE W REFLEX MICROSCOPIC
Bilirubin Urine: NEGATIVE
Glucose, UA: NEGATIVE
Hgb urine dipstick: NEGATIVE
Nitrite: NEGATIVE
Specific Gravity, Urine: 1.039 — ABNORMAL HIGH (ref 1.001–1.035)
pH: 7.5 (ref 5.0–8.0)

## 2015-07-02 ENCOUNTER — Ambulatory Visit (INDEPENDENT_AMBULATORY_CARE_PROVIDER_SITE_OTHER): Payer: 59 | Admitting: Internal Medicine

## 2015-07-02 ENCOUNTER — Encounter: Payer: Self-pay | Admitting: Internal Medicine

## 2015-07-02 VITALS — BP 112/70 | HR 97 | Temp 98.0°F | Resp 20 | Wt 148.0 lb

## 2015-07-02 DIAGNOSIS — A084 Viral intestinal infection, unspecified: Secondary | ICD-10-CM

## 2015-07-02 DIAGNOSIS — H9201 Otalgia, right ear: Secondary | ICD-10-CM

## 2015-07-02 DIAGNOSIS — R8290 Unspecified abnormal findings in urine: Secondary | ICD-10-CM | POA: Diagnosis not present

## 2015-07-02 LAB — POCT URINALYSIS DIPSTICK
Glucose, UA: NEGATIVE
Ketones, UA: NEGATIVE
Leukocytes, UA: NEGATIVE
Nitrite, UA: NEGATIVE
Spec Grav, UA: >=1.03
Urobilinogen, UA: 0.2
pH, UA: 7

## 2015-07-03 MED ORDER — DOXYCYCLINE HYCLATE 100 MG PO TABS: 100.0000 mg | ORAL_TABLET | Freq: Two times a day (BID) | ORAL | Status: DC

## 2015-07-03 NOTE — Patient Instructions (Signed)
Continue to force fluids to stay well hydrated and advance diet slowly.

## 2015-07-03 NOTE — Progress Notes (Signed)
   Subjective:    Patient ID: Jodell Ciproevin Packard, male    DOB: [birth year], 29 y.o.   MRN: [id number]  HPI Follow-up on recent vomiting diarrhea, volume depletion. Symptoms have resolved and he's able to a regular food once again. Trying to stay well hydrated. Urine specific gravity today is 1.039 but he is taking medication for hyperhidrosis.still complaining of some right ear discomfort in right jaw discomfort.  He wonders if he needs to see a dentist. He was treated with Z-Pak for probable right otitis media with recent illness    Review of Systems     Objective:   Physical Exam  Right TM is dull but not red and chronically scarred. No TMJ popping or tenderness. Right mandibular teeth do not appear to be tender to palpation with tongue blade. No adenopathy.      Assessment & Plan:  Viral gastroenteritis-improved  Right otalgia-etiology unclear  Plan:doxycycline 100 mg twice daily for 10 days. If symptoms persist after course of antibiotics see dentist

## 2015-07-03 NOTE — Patient Instructions (Addendum)
Continue to stay well hydrated with forcing by mouth fluids. Return as needed.take doxycycline 100 mg twice daily for 10 days. [facility]. If symptoms persist, see dentist

## 2015-09-01 ENCOUNTER — Encounter: Payer: Self-pay | Admitting: Internal Medicine

## 2015-09-01 NOTE — Progress Notes (Signed)
   Subjective:    Patient ID: Derek Jennings, male    DOB: 08-13-1985, 30 y.o.   MRN: [id number]  HPI Patient says he recently ate some leftover food that a friend of his gave him and subsequently developed fairly suddenly nausea vomiting and diarrhea. Has tried staying on clear liquids but can't seem to improve. Has had low-grade fever. Has had myalgias. Complaining of some right ear discomfort. No shaking chills. The urinary tract infection symptoms.    Review of Systems see above     Objective:   Physical Exam  Skin warm and dry. Nodes none. TMs are slightly full bilaterally. Neck is supple without adenopathy. Chest clear to auscultation. Cardiac exam regular rate and rhythm. Abdomen no hepatosplenomegaly masses or significant tenderness. Pharynx injected.      Assessment & Plan:  Probable viral gastroenteritis  Volume depletion  Pharyngitis  Otalgia  Plan: Appears to be volume depleted as  his urine specific gravity is greater than 1.030. We will screen him for acute hepatitis A. Testing for mononucleosis. Recommended clear liquids until symptoms resolve. CBC and C-met are within normal limits. Will start on Zithromax Z-PAK and take Phenergan tablets as needed for nausea and vomiting with follow-up visit

## 2016-10-10 ENCOUNTER — Emergency Department (HOSPITAL_COMMUNITY)
Admission: EM | Admit: 2016-10-10 | Discharge: 2016-10-11 | Disposition: A | Discharge status: Home / Self Care | Payer: Self-pay | Attending: Emergency Medicine | Admitting: Emergency Medicine

## 2016-10-10 DIAGNOSIS — Z79899 Other long term (current) drug therapy: Secondary | ICD-10-CM | POA: Insufficient documentation

## 2016-10-10 DIAGNOSIS — F1910 Other psychoactive substance abuse, uncomplicated: Secondary | ICD-10-CM | POA: Insufficient documentation

## 2016-10-10 DIAGNOSIS — F17200 Nicotine dependence, unspecified, uncomplicated: Secondary | ICD-10-CM | POA: Insufficient documentation

## 2016-10-10 DIAGNOSIS — T50904A Poisoning by unspecified drugs, medicaments and biological substances, undetermined, initial encounter: Secondary | ICD-10-CM

## 2016-10-10 DIAGNOSIS — T50901A Poisoning by unspecified drugs, medicaments and biological substances, accidental (unintentional), initial encounter: Secondary | ICD-10-CM | POA: Insufficient documentation

## 2016-10-10 LAB — RAPID URINE DRUG SCREEN, HOSP PERFORMED
Amphetamines: POSITIVE — AB
Barbiturates: NOT DETECTED
Benzodiazepines: NOT DETECTED
Cocaine: NOT DETECTED
Opiates: NOT DETECTED
Tetrahydrocannabinol: POSITIVE — AB

## 2016-10-10 LAB — ETHANOL: Alcohol, Ethyl (B): <5 mg/dL (ref <5)

## 2016-10-10 LAB — CBC WITH DIFFERENTIAL/PLATELET
Basophils Absolute: 0 10*3/uL (ref 0.0–0.1)
Basophils Relative: 0 %
Eosinophils Absolute: 0 10*3/uL (ref 0.0–0.7)
Eosinophils Relative: 0 %
HCT: 41.9 % (ref 39.0–52.0)
Hemoglobin: 14.3 g/dL (ref 13.0–17.0)
Lymphocytes Relative: 15 %
Lymphs Abs: 1.3 10*3/uL (ref 0.7–4.0)
MCH: 30.4 pg (ref 26.0–34.0)
MCHC: 34.1 g/dL (ref 30.0–36.0)
MCV: 89 fL (ref 78.0–100.0)
Monocytes Absolute: 0.6 10*3/uL (ref 0.1–1.0)
Monocytes Relative: 6 %
Neutro Abs: 6.9 10*3/uL (ref 1.7–7.7)
Neutrophils Relative %: 79 %
Platelets: 299 10*3/uL (ref 150–400)
RBC: 4.71 MIL/uL (ref 4.22–5.81)
RDW: 12.6 % (ref 11.5–15.5)
WBC: 8.8 10*3/uL (ref 4.0–10.5)

## 2016-10-10 LAB — COMPREHENSIVE METABOLIC PANEL
ALT: 14 U/L — ABNORMAL LOW (ref 17–63)
AST: 18 U/L (ref 15–41)
Albumin: 4.6 g/dL (ref 3.5–5.0)
Alkaline Phosphatase: 61 U/L (ref 38–126)
Anion gap: 8 (ref 5–15)
BUN: 14 mg/dL (ref 6–20)
CO2: 25 mmol/L (ref 22–32)
Calcium: 9.5 mg/dL (ref 8.9–10.3)
Chloride: 108 mmol/L (ref 101–111)
Creatinine, Ser: 0.93 mg/dL (ref 0.61–1.24)
GFR calc Af Amer: >60 mL/min (ref >60)
GFR calc non Af Amer: >60 mL/min (ref >60)
Glucose, Bld: 104 mg/dL — ABNORMAL HIGH (ref 65–99)
Potassium: 3.7 mmol/L (ref 3.5–5.1)
Sodium: 141 mmol/L (ref 135–145)
Total Bilirubin: 0.6 mg/dL (ref 0.3–1.2)
Total Protein: 8 g/dL (ref 6.5–8.1)

## 2016-10-10 LAB — ACETAMINOPHEN LEVEL: Acetaminophen (Tylenol), Serum: <10 ug/mL — ABNORMAL LOW (ref 10–30)

## 2016-10-10 LAB — CBG MONITORING, ED: Glucose-Capillary: 90 mg/dL (ref 65–99)

## 2016-10-10 LAB — LACTIC ACID, PLASMA: Lactic Acid, Venous: 1.4 mmol/L (ref 0.5–1.9)

## 2016-10-10 LAB — SALICYLATE LEVEL: Salicylate Lvl: <7 mg/dL (ref 2.8–30.0)

## 2016-10-10 MED ORDER — NALOXONE HCL 2 MG/2ML IJ SOSY
2.0000 mg | PREFILLED_SYRINGE | Freq: Once | INTRAMUSCULAR | Status: AC
  Administered 2016-10-10: 2 mg via INTRAVENOUS
  Filled 2016-10-10: qty 2

## 2016-10-10 MED ORDER — SODIUM CHLORIDE 0.9 % IV BOLUS (SEPSIS)
1000.0000 mL | Freq: Once | INTRAVENOUS | Status: AC
  Administered 2016-10-10: 1000 mL via INTRAVENOUS

## 2016-10-10 NOTE — ED Triage Notes (Signed)
Pt from hotel parking lot via EMS after suspected drug OD. [employer] reports that there was a brown substance in a syringe found on floor as well as white powdery substance. EMS reports that pt was lethargic en route but arrives unresponsive. EMS gave 2 mg IN w/o response. Dr Juleen ChinaKohut at bedside.

## 2016-10-10 NOTE — ED Provider Notes (Signed)
[facility]-EMERGENCY DEPT Provider Note   CSN: [id number] Arrival date & time: 10/10/16  1900  By signing my name below, I, Linna Darner, attest that this documentation has been prepared under the direction and in the presence of physician practitioner, Raeford Razor, MD. Electronically Signed: Linna Darner, Scribe. 10/10/2016. 7:04 PM.  History   Chief Complaint Chief Complaint  Patient presents with  . Drug Overdose    The history is provided by the EMS personnel. No language interpreter was used.    HPI Comments: LEVEL 5 CAVEAT DUE TO UNRESPONSIVENESS Derek Jennings is a 31 y.o. male who presents to the Emergency Department via EMS for evaluation of a drug overdose. Per EMS, patient was found outside of a hotel and appeared to be under the influence of unspecified drugs. EMS reports patient had a white, powdery substance on his person and there was a syringe filled with a brown substance nearby upon their arrival. EMS notes that patient has occasionally been conscious since their arrival but has mostly been unconscious and unresponsive. He received 2 mg narcan IN en route. No reported trauma. No additional information noted at this time.  Past Medical History:  Diagnosis Date  . ADD (attention deficit disorder)   . Allergy   . Hyperhidrosis     Patient Active Problem List   Diagnosis Date Noted  . Allergic rhinitis 12/11/2011  . Hyperhidrosis 08/01/2011    Past Surgical History:  Procedure Laterality Date  . ADENOIDECTOMY         Home Medications    Prior to Admission medications   Medication Sig Start Date End Date Taking? Authorizing Provider  doxycycline (VIBRA-TABS) 100 MG tablet Take 1 tablet (100 mg total) by mouth 2 (two) times daily. 07/03/15   Margaree Mackintosh, MD  glycopyrrolate (ROBINUL) 2 MG tablet Take 2 mg by mouth daily.      Historical Provider, MD  loratadine (CLARITIN) 10 MG tablet Take 10 mg by mouth daily.    Historical Provider, MD  promethazine  (PHENERGAN) 25 MG tablet Take 1 tablet (25 mg total) by mouth every 6 (six) hours as needed for nausea or vomiting. 06/20/15   Margaree Mackintosh, MD    Family History Family History  Problem Relation Age of Onset  . Heart disease Father     Social History Social History  Substance Use Topics  . Smoking status: Current Every Day Smoker    Packs/day: 0.20  . Smokeless tobacco: Former Neurosurgeon  . Alcohol use Yes     Comment: socially     Allergies   Amoxicillin   Review of Systems Review of Systems  Unable to perform ROS: Patient unresponsive    Physical Exam Updated Vital Signs There were no vitals taken for this visit.  Physical Exam  Constitutional: He appears well-developed and well-nourished.  Patient verbally unresponsive.  HENT:  Head: Normocephalic.  Right Ear: External ear normal.  Left Ear: External ear normal.  Nose: Nose normal.  Eyes: Conjunctivae are normal. Right eye exhibits no discharge. Left eye exhibits no discharge.  Pupils constricted but equal.  Neck: Normal range of motion.  Cardiovascular: Regular rhythm and normal heart sounds.  Bradycardia present.   No murmur heard. Mild bradycardia.  Pulmonary/Chest: Effort normal and breath sounds normal. No respiratory distress. He has no wheezes. He has no rales.  Abdominal: Soft. There is no tenderness. There is no rebound and no guarding.  Musculoskeletal: Normal range of motion. He exhibits no edema or tenderness.  No  external signs of trauma. Track marks to right upper extremity.  Neurological:  Withdraws to pain but doesn't localize.  Skin: Skin is warm and dry. No rash noted. No erythema. No pallor.  Psychiatric: He has a normal mood and affect. His behavior is normal.  Nursing note and vitals reviewed.   ED Treatments / Results  Labs (all labs ordered are listed, but only abnormal results are displayed) Labs Reviewed  COMPREHENSIVE METABOLIC PANEL - Abnormal; Notable for the following:        Result Value   Glucose, Bld 104 (*)    ALT 14 (*)    All other components within normal limits  ACETAMINOPHEN LEVEL - Abnormal; Notable for the following:    Acetaminophen (Tylenol), Serum <10 (*)    All other components within normal limits  RAPID URINE DRUG SCREEN, HOSP PERFORMED - Abnormal; Notable for the following:    Amphetamines POSITIVE (*)    Tetrahydrocannabinol POSITIVE (*)    All other components within normal limits  CBC WITH DIFFERENTIAL/PLATELET  SALICYLATE LEVEL  LACTIC ACID, PLASMA  ETHANOL  CBG MONITORING, ED    EKG  EKG Interpretation  Date/Time:  Saturday October 10 2016 19:11:06 EST Ventricular Rate:  56 PR Interval:    QRS Duration: 103 QT Interval:  406 QTC Calculation: 392 R Axis:   77 Text Interpretation:  Sinus rhythm early repolarization Confirmed by Bell Carbo  MD, Thelma Viana ([id number]) on 10/10/2016 7:28:11 PM       Radiology No results found.  Procedures Procedures (including critical care time)  CRITICAL CARE Performed by: Raeford RazorKOHUT, Poetry Cerro Total critical care time: 35 minutes Critical care time was exclusive of separately billable procedures and treating other patients. Critical care was necessary to treat or prevent imminent or life-threatening deterioration. Critical care was time spent personally by me on the following activities: development of treatment plan with patient and/or surrogate as well as nursing, discussions with consultants, evaluation of patient's response to treatment, examination of patient, obtaining history from patient or surrogate, ordering and performing treatments and interventions, ordering and review of laboratory studies, ordering and review of radiographic studies, pulse oximetry and re-evaluation of patient's condition.   DIAGNOSTIC STUDIES: Oxygen Saturation is 98% on RA, normal by my interpretation.    Medications Ordered in ED Medications - No data to display   Initial Impression / Assessment and Plan / ED Course    I have reviewed the triage vital signs and the nursing notes.  Pertinent labs & imaging results that were available during my care of the patient were reviewed by me and considered in my medical decision making (see chart for details).     30yM with likely drug overdose. Arrived obtunded and debated intubating him. He was ultimately observed and had progressive improvement to being alert and carrying on a conversation. He denies drug use. He would like to go home though. It has been determined that no acute conditions requiring further emergency intervention are present at this time. The patient has been advised of the diagnosis and plan. I reviewed any labs and imaging including any potential incidental findings. We have discussed signs and symptoms that warrant return to the ED and they are listed in the discharge instructions.    Final Clinical Impressions(s) / ED Diagnoses   Final diagnoses:  Polysubstance abuse  Drug overdose, undetermined intent, initial encounter    New Prescriptions New Prescriptions   No medications on file   I personally preformed the services scribed in my presence. The  recorded information has been reviewed is accurate. Raeford Razor, MD.    Raeford Razor, MD 10/20/16 [phone]

## 2016-10-14 ENCOUNTER — Telehealth: Payer: Self-pay | Admitting: Internal Medicine

## 2016-10-14 NOTE — Telephone Encounter (Signed)
Called to follow-up on recent ED visit. No answer and unable to leave message. Apparently phone does not accept messages.

## 2018-11-22 ENCOUNTER — Other Ambulatory Visit: Payer: Self-pay

## 2018-11-22 ENCOUNTER — Encounter (HOSPITAL_COMMUNITY): Payer: Self-pay

## 2018-11-22 ENCOUNTER — Emergency Department (HOSPITAL_COMMUNITY)
Admission: EM | Admit: 2018-11-22 | Discharge: 2018-11-22 | Disposition: A | Discharge status: Home / Self Care | Payer: Self-pay | Attending: Emergency Medicine | Admitting: Emergency Medicine

## 2018-11-22 DIAGNOSIS — Z79899 Other long term (current) drug therapy: Secondary | ICD-10-CM | POA: Insufficient documentation

## 2018-11-22 DIAGNOSIS — F17200 Nicotine dependence, unspecified, uncomplicated: Secondary | ICD-10-CM | POA: Insufficient documentation

## 2018-11-22 DIAGNOSIS — F1910 Other psychoactive substance abuse, uncomplicated: Secondary | ICD-10-CM | POA: Insufficient documentation

## 2018-11-22 NOTE — ED Provider Notes (Signed)
[facility]-EMERGENCY DEPT Provider Note   CSN: [id number] Arrival date & time: 11/22/18  [id number]    History   Chief Complaint Chief Complaint  Patient presents with  . detox    HPI Derek Jennings is a 33 y.o. male.    Derek Jennings is a 33 y.o. male  with a PMH as listed below who presents to the Emergency Department requesting detox.  Patient states that he primarily does crystal meth and would like to quit.  He also does smoke weed and occasionally will use heroin.  Patient states that he is interested in going to an inpatient facility to help him quit.  Last used yesterday morning.  Occasionally drinks alcohol, but not daily.  Denies any chest pain, shortness of breath, fevers, abdominal pain, nausea or vomiting.  He does report trying to shoot up in his right hand, but missing the vein.  Does have some tenderness to the dorsum of the right hand which is been bothering him.  He thinks it is fine, but would like it to be checked out as well while he is here. Denies SI / HI / AVH.  The history is provided by the patient and medical records. No language interpreter was used.    Past Medical History:  Diagnosis Date  . ADD (attention deficit disorder)   . Allergy   . Hyperhidrosis     Patient Active Problem List   Diagnosis Date Noted  . Allergic rhinitis 12/11/2011  . Hyperhidrosis 08/01/2011    Past Surgical History:  Procedure Laterality Date  . ADENOIDECTOMY          Home Medications    Prior to Admission medications   Medication Sig Start Date End Date Taking? Authorizing Provider  doxycycline (VIBRA-TABS) 100 MG tablet Take 1 tablet (100 mg total) by mouth 2 (two) times daily. Patient not taking: Reported on 11/22/2018 07/03/15   Margaree MackintoshBaxley, Mary J, MD  glycopyrrolate (ROBINUL) 2 MG tablet Take 2 mg by mouth daily.      [provider]  loratadine (CLARITIN) 10 MG tablet Take 10 mg by mouth daily.    [provider]  promethazine  (PHENERGAN) 25 MG tablet Take 1 tablet (25 mg total) by mouth every 6 (six) hours as needed for nausea or vomiting. Patient not taking: Reported on 11/22/2018 06/20/15   Margaree MackintoshBaxley, Mary J, MD    Family History Family History  Problem Relation Age of Onset  . Heart disease Father     Social History Social History   Tobacco Use  . Smoking status: Current Every Day Smoker    Packs/day: 0.20  . Smokeless tobacco: Former Engineer, waterUser  Substance Use Topics  . Alcohol use: Yes    Comment: socially  . Drug use: No     Allergies   Amoxicillin   Review of Systems Review of Systems  Musculoskeletal: Positive for myalgias.  All other systems reviewed and are negative.    Physical Exam Updated Vital Signs BP 136/78   Pulse 78   Temp 98.5 F (36.9 C) (Oral)   Resp 16   Ht 6' (1.829 m)   Wt 90.7 kg   SpO2 100%   BMI 27.12 kg/m   Physical Exam Vitals signs and nursing note reviewed.  Constitutional:      General: He is not in acute distress.    Appearance: He is well-developed.  HENT:     Head: Normocephalic and atraumatic.  Neck:     Musculoskeletal: Neck  supple.  Cardiovascular:     Rate and Rhythm: Normal rate and regular rhythm.     Heart sounds: Normal heart sounds. No murmur.  Pulmonary:     Effort: Pulmonary effort is normal. No respiratory distress.     Breath sounds: Normal breath sounds.  Abdominal:     General: There is no distension.     Palpations: Abdomen is soft.     Tenderness: There is no abdominal tenderness.  Musculoskeletal:     Comments: 1 cm firm knot to the dorsum of the right hand.  Not tender to palpation.  No surrounding erythema.  No drainage.  Full range of pain including good strong grip strength of the right hand.  Good cap refill.  Sensation intact.  2+ radial pulse.  Skin:    General: Skin is warm and dry.  Neurological:     Mental Status: He is alert and oriented to person, place, and time.      ED Treatments / Results  Labs (all  labs ordered are listed, but only abnormal results are displayed) Labs Reviewed - No data to display  EKG None  Radiology No results found.  Procedures Procedures (including critical care time)  Medications Ordered in ED Medications - No data to display   Initial Impression / Assessment and Plan / ED Course  I have reviewed the triage vital signs and the nursing notes.  Pertinent labs & imaging results that were available during my care of the patient were reviewed by me and considered in my medical decision making (see chart for details).       Derek Jennings is a 33 y.o. male who presents to ED requesting assistance finding inpatient detox facility for polysubstance abuse.  On exam, he is afebrile, hemodynamically stable with normal cardiopulmonary exam benign abdominal exam.  He does have a firm very small nodule to the dorsum of his hand where he missed his vein when trying to shoot up the other day.  No erythema or tenderness.  Does not appear consistent with abscess at this time.  Likely phlebitis.  Recommended rice therapy and NSAIDs as needed and patient agrees with treatment plan.  Will obtain labs for medical clearance for facility.  I have provided him with a list of inpatient resources while here in the emergency department so that he can use our phone to work on getting treatment as he does not have one of his own.  Informed by nursing staff that patient had IV supplies in his pockets and was going through drawers looking for other supplies. He also threw the resource guide I printed for him in the sharp container. Clearly he has no interest in getting clean, therefore will d/c med clearance labs and discharge him. I did include another copy of resource guide for substance abuse. Hopefully he will use it. He knows he can return to ER for help at any time.   Patient discussed with Dr. Patria Mane who agrees with treatment plan.    Final Clinical Impressions(s) / ED Diagnoses    Final diagnoses:  Polysubstance abuse [facility])    ED Discharge Orders    None       Jene Huq, Chase Picket, PA-C 11/22/18 1107    Azalia Bilis, MD 11/22/18 [phone]

## 2018-11-22 NOTE — ED Notes (Signed)
Pt placed bedside table behind door.  Pt sitting in stool by computer in room.  Pt jumped when this RN opened the door to ask pt to sit in the bed.

## 2018-11-22 NOTE — Discharge Instructions (Signed)
I encourage you to quit doing drugs. See the resource guide for assistance.   Apply ice to your hand a few times a day (see instructions below). Taking ibuprofen (400-600mg ) 2-3 times a day can help with pain / swelling as well.   Return to ER as needed.

## 2018-11-22 NOTE — ED Triage Notes (Signed)
Pt brought in by [employer].  Pt stated he used cocaine, heroine, ice and other opiates. Pt's mother called [employer].  Pt was being verbally aggressive towards her.  When [employer] arrived pt stated he wanted to detox.

## 2018-11-22 NOTE — ED Notes (Signed)
Upon entering this patient room I looked in the drawer to find some tape and found an open needle and iv extender. I attempted to collect patients labs and patient first told me that I did not need to use a tourniquet and that he wanted to stick hisself because he knew exactly how to get in the vein. Patient attempted to use needle and could not get blood. I took the needle and discarded it in the sharps container and also found the resources that the PA printed off in the sharps container. RN and PA notified

## 2018-11-22 NOTE — ED Notes (Signed)
After pt D/C, this RN inspected room.  RN found an open saline flush, a used 10 cc syringe with pink transfer device from the sharps box, open IV start kit, and blood culture bottles.

## 2018-12-08 ENCOUNTER — Encounter (HOSPITAL_COMMUNITY): Payer: Self-pay | Admitting: Emergency Medicine

## 2018-12-08 ENCOUNTER — Emergency Department (HOSPITAL_COMMUNITY)
Admission: EM | Admit: 2018-12-08 | Discharge: 2018-12-08 | Disposition: A | Discharge status: Home / Self Care | Payer: Self-pay | Attending: Emergency Medicine | Admitting: Emergency Medicine

## 2018-12-08 ENCOUNTER — Other Ambulatory Visit: Payer: Self-pay

## 2018-12-08 ENCOUNTER — Emergency Department (HOSPITAL_COMMUNITY): Payer: Self-pay

## 2018-12-08 DIAGNOSIS — L03116 Cellulitis of left lower limb: Secondary | ICD-10-CM | POA: Insufficient documentation

## 2018-12-08 DIAGNOSIS — F17200 Nicotine dependence, unspecified, uncomplicated: Secondary | ICD-10-CM | POA: Insufficient documentation

## 2018-12-08 DIAGNOSIS — Z79899 Other long term (current) drug therapy: Secondary | ICD-10-CM | POA: Insufficient documentation

## 2018-12-08 MED ORDER — CLINDAMYCIN HCL 300 MG PO CAPS: 300.0000 mg | ORAL_CAPSULE | Freq: Three times a day (TID) | ORAL | 0 refills | Status: DC

## 2018-12-08 MED ORDER — CLINDAMYCIN HCL 300 MG PO CAPS
300.0000 mg | ORAL_CAPSULE | Freq: Once | ORAL | Status: AC
  Administered 2018-12-08: 300 mg via ORAL
  Filled 2018-12-08: qty 1

## 2018-12-08 NOTE — Discharge Instructions (Addendum)
Evaluated  today for cellulitis.  I have marked this area with a marker.  Of also given antibiotics.  Please take as prescribed.  You will need to follow-up in 48 hours for evaluation, on Sunday evening or Monday morning.  If you notice red streaking going up her foot or you develop a fever please seek reevaluation sooner.

## 2018-12-08 NOTE — ED Notes (Signed)
Bed: WTR7 Expected date:  Expected time:  Means of arrival:  Comments: 

## 2018-12-08 NOTE — ED Provider Notes (Signed)
[facility]-EMERGENCY DEPT Provider Note   CSN: [id number] Arrival date & time: 12/08/18  1610  History   Chief Complaint Chief Complaint  Patient presents with  . Foot Pain   HPI Derek Jennings is a 33 y.o. male with history significant for polysubstance abuse who presents for evaluation of foot swelling.  Patient was seen by urgent care 2 days ago for foot redness swelling, pain.  At that time it was thought to have allergic reaction or possible insect bite.  Patient states he was given a shot of steroids, told to take Benadryl and Pepcid and it would"self resolve."  Patient states he had progressive redness and swelling to his left foot since onset on Monday.  Does have a history of polysubstance abuse with crystal meth and heroin in Epic chart, however patient's denies this in the room. Denies recent injection into foot. Denies fever, chills, nausea, vomiting, shortness of breath, cough, abdominal pain, diarrhea, dysuria, decreased range of motion, numbness or tingling in his extremities. Denies previous history of MRSA.  No recent injury, trauma or insect bites. Has been ambulatory in ED without difficulty. Rates his current pain a 6/10. Denies radiation of pain. Pain described as an aching sensation.  History obtained from patient.  No interpreter was used.     HPI  Past Medical History:  Diagnosis Date  . ADD (attention deficit disorder)   . Allergy   . Hyperhidrosis     Patient Active Problem List   Diagnosis Date Noted  . Allergic rhinitis 12/11/2011  . Hyperhidrosis 08/01/2011    Past Surgical History:  Procedure Laterality Date  . ADENOIDECTOMY          Home Medications    Prior to Admission medications   Medication Sig Start Date End Date Taking? Authorizing Provider  clindamycin (CLEOCIN) 300 MG capsule Take 1 capsule (300 mg total) by mouth 3 (three) times daily for 5 days. 12/08/18 12/13/18  Georgenia Salim A, PA-C  doxycycline (VIBRA-TABS)  100 MG tablet Take 1 tablet (100 mg total) by mouth 2 (two) times daily. Patient not taking: Reported on 11/22/2018 07/03/15   Margaree Mackintosh, MD  glycopyrrolate (ROBINUL) 2 MG tablet Take 2 mg by mouth daily.      [provider]  loratadine (CLARITIN) 10 MG tablet Take 10 mg by mouth daily.    [provider]  promethazine (PHENERGAN) 25 MG tablet Take 1 tablet (25 mg total) by mouth every 6 (six) hours as needed for nausea or vomiting. Patient not taking: Reported on 11/22/2018 06/20/15   Margaree Mackintosh, MD    Family History Family History  Problem Relation Age of Onset  . Heart disease Father     Social History Social History   Tobacco Use  . Smoking status: Current Every Day Smoker    Packs/day: 0.20  . Smokeless tobacco: Former Engineer, water Use Topics  . Alcohol use: Yes    Comment: socially  . Drug use: No     Allergies   Amoxicillin   Review of Systems Review of Systems  Constitutional: Negative.   HENT: Negative.   Respiratory: Negative.   Cardiovascular: Negative.   Gastrointestinal: Negative.   Genitourinary: Negative.   Musculoskeletal: Negative.   Skin: Positive for color change.  Neurological: Negative.   All other systems reviewed and are negative.    Physical Exam Updated Vital Signs BP 111/74 (BP Location: Right Arm)   Pulse 75   Temp 98.5 F (36.9  C) (Oral)   Resp 17   SpO2 100%   Physical Exam Vitals signs and nursing note reviewed.  Constitutional:      General: He is not in acute distress.    Appearance: He is well-developed. He is not ill-appearing, toxic-appearing or diaphoretic.  HENT:     Head: Normocephalic and atraumatic.     Nose: Nose normal.     Mouth/Throat:     Mouth: Mucous membranes are moist.  Eyes:     Pupils: Pupils are equal, round, and reactive to light.  Neck:     Musculoskeletal: Normal range of motion and neck supple.  Cardiovascular:     Rate and Rhythm: Normal rate and regular rhythm.      Pulses: Normal pulses.          Dorsalis pedis pulses are 2+ on the right side and 2+ on the left side.       Posterior tibial pulses are 2+ on the right side and 2+ on the left side.     Heart sounds: Normal heart sounds. No murmur. No friction rub. No gallop.   Pulmonary:     Effort: Pulmonary effort is normal. No respiratory distress.     Comments: Clear to ascultation bilaterally without wheeze, rhonchi or rales. Speaks in full sentences without difficulty. No tachypnea. Abdominal:     General: There is no distension.     Palpations: Abdomen is soft.     Comments: Soft, non tender without rebound or guarding.  Musculoskeletal: Normal range of motion.     Right ankle: Normal.     Left ankle: He exhibits swelling. He exhibits normal range of motion, no ecchymosis, no deformity, no laceration and normal pulse. Tenderness.     Right foot: Normal range of motion. No deformity.     Left foot: Normal range of motion. No deformity.       Feet:     Comments: Diffuse left tissue swelling to dorsal aspect of left foot up until left ankle.  2+ DP, PT pulses bilaterally.  Full range of motion with  plantarflexion, dorsiflexion to bilateral ankles without difficulty.  Wiggles toes without difficulty. He has no tenderness over his left ankle, tib/fibula. No tenderness to bilateral calves. No tenderness to RLE.  Feet:     Right foot:     Skin integrity: Skin integrity normal.     Left foot:     Skin integrity: Erythema and warmth present. No ulcer, blister, skin breakdown, callus, dry skin or fissure.  Skin:    General: Skin is warm and dry.     Comments: Brisk cap refill.  Diffused erythema to dorsal aspect with soft tissue swelling to left foot.  No fluctuance or induration.  No rashes or lesions.  No streaking.  Neurological:     Mental Status: He is alert.     Comments: Intact sensation in her upper and dull.  Ambulatory department without difficulty.        ED Treatments / Results   Labs (all labs ordered are listed, but only abnormal results are displayed) Labs Reviewed  CULTURE, BLOOD (ROUTINE X 2)  CULTURE, BLOOD (ROUTINE X 2)    EKG None  Radiology Dg Foot Complete Left  Result Date: 12/08/2018 CLINICAL DATA:  Pt states he has possible bug bite to left foot, on Monday. Pt c/o swollen and painful to touch on left foot. EXAM: LEFT FOOT - COMPLETE 3+ VIEW COMPARISON:  None. FINDINGS: There is no evidence of  fracture or dislocation. There is no evidence of arthropathy or other focal bone abnormality. There is relative pes planus. There is soft tissue swelling along the dorsal aspect of the foot. IMPRESSION: 1.  No acute osseous injury of the left foot. 2. Severe soft tissue swelling along the dorsal aspect of the foot consistent with cellulitis. Electronically Signed   By: Elige Ko   On: 12/08/2018 17:34    Procedures Procedures (including critical care time)  Medications Ordered in ED Medications  clindamycin (CLEOCIN) capsule 300 mg (300 mg Oral Given 12/08/18 1804)   Initial Impression / Assessment and Plan / ED Course  I have reviewed the triage vital signs and the nursing notes.  Pertinent labs & imaging results that were available during my care of the patient were reviewed by me and considered in my medical decision making (see chart for details).  33 year old male appears otherwise well presents for evaluation of left foot swelling and pain.  Afebrile, nonseptic, non-ill-appearing.  Patient denies history of IV drug use, however patient has been seen multiple times in this ED for "detox" from heroin and crystal meth use.  Seen by urgent care 2 days ago and was told pain from allergic reaction and to take Benadryl and Pepcid.  Patient is had increased redness and swelling to dorsal aspect of his left foot.  He denies any insect bites or injecting IV drugs into this area.  Ambulatory in department without difficulty.  On exam patient with diffuse erythema  with soft tissue swelling to dorsal aspect of his left foot.  Has mild tenderness over the same area.  He has a normal musculoskeletal exam.  He is neurovascularly intact.  Heart without murmurs, rubs or gallops.  Lungs clear.  Given patient has history of IV drug use will obtain x-ray to rule out retained foreign body, gas forming organism.  He does not have any fluctuance or induration to suggest abscess at this time.  Given history of IV drug use will obtain blood cultures to make sure this is not a possible seeded infection. Will start on clindamycin and have patient follow-up for wound check in 48 hours, Sunday evening/Monday morning.  Low suspicion for septic joint, gout, hemarthrosis, abscess, fracture or dislocation. Plain film without fracture, dislocation, Osteomyelitis, gas forming organism, joint effusion.  Area marked and pt encouraged to return if redness begins to streak, extends beyond the markings, and/or fever or nausea/vomiting develop.  Pt is alert, oriented, NAD, afebrile, non tachycardic, nonseptic and nontoxic appearing.  Pt given first dose of Abx in ED.  Patient is hemodynamically stable and in no acute distress.  Patient able to ambulate in department prior to ED.  Evaluation does not show acute pathology that would require ongoing or additional emergent interventions while in the emergency department or further inpatient treatment.  I have discussed the diagnosis with the patient and answered all questions.  Pain is been managed while in the emergency department and patient has no further complaints prior to discharge.  Patient is comfortable with plan discussed in room and is stable for discharge at this time.  I have discussed strict return precautions for returning to the emergency department.  Patient was encouraged to follow-up with PCP/specialist refer to at discharge.  Patient discussed with Dr. Patria Mane attending physician who agrees with above treatment, plan and disposition.        Final Clinical Impressions(s) / ED Diagnoses   Final diagnoses:  Cellulitis of left lower extremity  ED Discharge Orders         Ordered    clindamycin (CLEOCIN) 300 MG capsule  3 times daily     12/08/18 1803           Kaedin Hicklin A, PA-C 12/08/18 Junious Silk, MD 12/08/18 2308

## 2018-12-08 NOTE — ED Triage Notes (Signed)
Per pt, states he thinks he might have been bit by a spider-went to UC on Tuesday and was given a steroid, antihistamine and Pepcid increase left foot swelling, redness and pain-was not given any antibiotics

## 2018-12-10 ENCOUNTER — Encounter (HOSPITAL_COMMUNITY): Payer: Self-pay | Admitting: Emergency Medicine

## 2018-12-10 ENCOUNTER — Other Ambulatory Visit: Payer: Self-pay

## 2018-12-10 ENCOUNTER — Inpatient Hospital Stay (HOSPITAL_COMMUNITY)
Admission: EM | Admit: 2018-12-10 | Discharge: 2018-12-15 | DRG: 854 | Disposition: A | Discharge status: Home / Self Care | Payer: Self-pay | Attending: Internal Medicine | Admitting: Internal Medicine

## 2018-12-10 ENCOUNTER — Encounter (HOSPITAL_COMMUNITY)
Admission: EM | Disposition: A | Discharge status: Home / Self Care | Payer: Self-pay | Source: Home / Self Care | Attending: Internal Medicine

## 2018-12-10 ENCOUNTER — Emergency Department (HOSPITAL_COMMUNITY): Payer: Self-pay

## 2018-12-10 DIAGNOSIS — L03119 Cellulitis of unspecified part of limb: Secondary | ICD-10-CM | POA: Diagnosis present

## 2018-12-10 DIAGNOSIS — L97529 Non-pressure chronic ulcer of other part of left foot with unspecified severity: Secondary | ICD-10-CM | POA: Diagnosis present

## 2018-12-10 DIAGNOSIS — F17210 Nicotine dependence, cigarettes, uncomplicated: Secondary | ICD-10-CM | POA: Diagnosis present

## 2018-12-10 DIAGNOSIS — Z23 Encounter for immunization: Secondary | ICD-10-CM

## 2018-12-10 DIAGNOSIS — Z881 Allergy status to other antibiotic agents status: Secondary | ICD-10-CM

## 2018-12-10 DIAGNOSIS — Z79899 Other long term (current) drug therapy: Secondary | ICD-10-CM

## 2018-12-10 DIAGNOSIS — Z1159 Encounter for screening for other viral diseases: Secondary | ICD-10-CM

## 2018-12-10 DIAGNOSIS — A419 Sepsis, unspecified organism: Principal | ICD-10-CM | POA: Diagnosis present

## 2018-12-10 DIAGNOSIS — F988 Other specified behavioral and emotional disorders with onset usually occurring in childhood and adolescence: Secondary | ICD-10-CM | POA: Diagnosis present

## 2018-12-10 DIAGNOSIS — L089 Local infection of the skin and subcutaneous tissue, unspecified: Secondary | ICD-10-CM | POA: Diagnosis present

## 2018-12-10 DIAGNOSIS — Z91048 Other nonmedicinal substance allergy status: Secondary | ICD-10-CM

## 2018-12-10 DIAGNOSIS — Z7952 Long term (current) use of systemic steroids: Secondary | ICD-10-CM

## 2018-12-10 DIAGNOSIS — F1590 Other stimulant use, unspecified, uncomplicated: Secondary | ICD-10-CM | POA: Diagnosis present

## 2018-12-10 DIAGNOSIS — F1910 Other psychoactive substance abuse, uncomplicated: Secondary | ICD-10-CM | POA: Diagnosis present

## 2018-12-10 DIAGNOSIS — R739 Hyperglycemia, unspecified: Secondary | ICD-10-CM | POA: Diagnosis present

## 2018-12-10 DIAGNOSIS — E440 Moderate protein-calorie malnutrition: Secondary | ICD-10-CM | POA: Diagnosis present

## 2018-12-10 DIAGNOSIS — T148XXA Other injury of unspecified body region, initial encounter: Secondary | ICD-10-CM

## 2018-12-10 DIAGNOSIS — Z6826 Body mass index (BMI) 26.0-26.9, adult: Secondary | ICD-10-CM

## 2018-12-10 DIAGNOSIS — L03116 Cellulitis of left lower limb: Secondary | ICD-10-CM | POA: Diagnosis present

## 2018-12-10 DIAGNOSIS — D649 Anemia, unspecified: Secondary | ICD-10-CM | POA: Diagnosis present

## 2018-12-10 DIAGNOSIS — L02619 Cutaneous abscess of unspecified foot: Secondary | ICD-10-CM | POA: Diagnosis present

## 2018-12-10 HISTORY — PX: I & D EXTREMITY: SHX5045

## 2018-12-10 LAB — COMPREHENSIVE METABOLIC PANEL
ALT: 32 U/L (ref 0–44)
AST: 17 U/L (ref 15–41)
Albumin: 3 g/dL — ABNORMAL LOW (ref 3.5–5.0)
Alkaline Phosphatase: 81 U/L (ref 38–126)
Anion gap: 9 (ref 5–15)
BUN: 19 mg/dL (ref 6–20)
CO2: 24 mmol/L (ref 22–32)
Calcium: 8.3 mg/dL — ABNORMAL LOW (ref 8.9–10.3)
Chloride: 103 mmol/L (ref 98–111)
Creatinine, Ser: 0.81 mg/dL (ref 0.61–1.24)
GFR calc Af Amer: >60 mL/min (ref >60)
GFR calc non Af Amer: >60 mL/min (ref >60)
Glucose, Bld: 111 mg/dL — ABNORMAL HIGH (ref 70–99)
Potassium: 3.7 mmol/L (ref 3.5–5.1)
Sodium: 136 mmol/L (ref 135–145)
Total Bilirubin: 0.6 mg/dL (ref 0.3–1.2)
Total Protein: 6.8 g/dL (ref 6.5–8.1)

## 2018-12-10 LAB — CBC WITH DIFFERENTIAL/PLATELET
Abs Immature Granulocytes: 0.18 10*3/uL — ABNORMAL HIGH (ref 0.00–0.07)
Basophils Absolute: 0.1 10*3/uL (ref 0.0–0.1)
Basophils Relative: 0 %
Eosinophils Absolute: 0 10*3/uL (ref 0.0–0.5)
Eosinophils Relative: 0 %
HCT: 39.9 % (ref 39.0–52.0)
Hemoglobin: 13.4 g/dL (ref 13.0–17.0)
Immature Granulocytes: 1 %
Lymphocytes Relative: 10 %
Lymphs Abs: 1.6 10*3/uL (ref 0.7–4.0)
MCH: 31 pg (ref 26.0–34.0)
MCHC: 33.6 g/dL (ref 30.0–36.0)
MCV: 92.4 fL (ref 80.0–100.0)
Monocytes Absolute: 2.4 10*3/uL — ABNORMAL HIGH (ref 0.1–1.0)
Monocytes Relative: 15 %
Neutro Abs: 12 10*3/uL — ABNORMAL HIGH (ref 1.7–7.7)
Neutrophils Relative %: 74 %
Platelets: 380 10*3/uL (ref 150–400)
RBC: 4.32 MIL/uL (ref 4.22–5.81)
RDW: 13.1 % (ref 11.5–15.5)
WBC: 16.3 10*3/uL — ABNORMAL HIGH (ref 4.0–10.5)
nRBC: 0 % (ref 0.0–0.2)

## 2018-12-10 LAB — LACTIC ACID, PLASMA
Lactic Acid, Venous: 0.9 mmol/L (ref 0.5–1.9)
Lactic Acid, Venous: 0.6 mmol/L (ref 0.5–1.9)

## 2018-12-10 SURGERY — IRRIGATION AND DEBRIDEMENT EXTREMITY
Anesthesia: General | Site: Foot | Laterality: Left

## 2018-12-10 MED ORDER — TETANUS-DIPHTH-ACELL PERTUSSIS 5-2.5-18.5 LF-MCG/0.5 IM SUSP
0.5000 mL | Freq: Once | INTRAMUSCULAR | Status: AC
  Administered 2018-12-10: 21:00:00 0.5 mL via INTRAMUSCULAR
  Filled 2018-12-10: qty 0.5

## 2018-12-10 MED ORDER — SODIUM CHLORIDE 0.9 % IV BOLUS
1000.0000 mL | Freq: Once | INTRAVENOUS | Status: AC
  Administered 2018-12-10: 1000 mL via INTRAVENOUS

## 2018-12-10 MED ORDER — MIDAZOLAM HCL 2 MG/2ML IJ SOLN
INTRAMUSCULAR | Status: AC
  Filled 2018-12-10: qty 2

## 2018-12-10 MED ORDER — SUCCINYLCHOLINE CHLORIDE 200 MG/10ML IV SOSY
PREFILLED_SYRINGE | INTRAVENOUS | Status: AC
  Filled 2018-12-10: qty 10

## 2018-12-10 MED ORDER — VANCOMYCIN HCL 10 G IV SOLR
1500.0000 mg | Freq: Once | INTRAVENOUS | Status: AC
  Administered 2018-12-10: 23:00:00 1500 mg via INTRAVENOUS
  Filled 2018-12-10: qty 1500

## 2018-12-10 MED ORDER — HYDROMORPHONE HCL 1 MG/ML IJ SOLN
0.5000 mg | INTRAMUSCULAR | Status: DC | PRN
  Administered 2018-12-11: 09:00:00 0.5 mg via INTRAVENOUS
  Filled 2018-12-10: qty 0.5

## 2018-12-10 MED ORDER — FENTANYL CITRATE (PF) 250 MCG/5ML IJ SOLN
INTRAMUSCULAR | Status: AC
  Filled 2018-12-10: qty 5

## 2018-12-10 MED ORDER — HYDROMORPHONE HCL 1 MG/ML IJ SOLN
1.0000 mg | Freq: Once | INTRAMUSCULAR | Status: AC
  Administered 2018-12-10: 1 mg via INTRAVENOUS
  Filled 2018-12-10: qty 1

## 2018-12-10 MED ORDER — LIDOCAINE 2% (20 MG/ML) 5 ML SYRINGE
INTRAMUSCULAR | Status: AC
  Filled 2018-12-10: qty 5

## 2018-12-10 MED ORDER — PROPOFOL 10 MG/ML IV BOLUS
INTRAVENOUS | Status: AC
  Filled 2018-12-10: qty 20

## 2018-12-10 MED ORDER — ONDANSETRON HCL 4 MG/2ML IJ SOLN
INTRAMUSCULAR | Status: AC
  Filled 2018-12-10: qty 2

## 2018-12-10 SURGICAL SUPPLY — 33 items
BAG SPEC THK2 15X12 ZIP CLS (MISCELLANEOUS)
BAG ZIPLOCK 12X15 (MISCELLANEOUS) ×1 IMPLANT
BANDAGE ESMARK 6X9 LF (GAUZE/BANDAGES/DRESSINGS) ×1 IMPLANT
BNDG CMPR 9X6 STRL LF SNTH (GAUZE/BANDAGES/DRESSINGS)
BNDG ESMARK 6X9 LF (GAUZE/BANDAGES/DRESSINGS)
BNDG GAUZE ELAST 4 BULKY (GAUZE/BANDAGES/DRESSINGS) ×1 IMPLANT
COVER SURGICAL LIGHT HANDLE (MISCELLANEOUS) ×3 IMPLANT
COVER WAND RF STERILE (DRAPES) IMPLANT
CUFF TOURN SGL QUICK 18X4 (TOURNIQUET CUFF) IMPLANT
CUFF TOURN SGL QUICK 34 (TOURNIQUET CUFF)
CUFF TRNQT CYL 34X4.125X (TOURNIQUET CUFF) IMPLANT
DRAIN PENROSE 18X1/2 LTX STRL (DRAIN) ×1 IMPLANT
DRSG EMULSION OIL 3X3 NADH (GAUZE/BANDAGES/DRESSINGS) ×1 IMPLANT
DRSG PAD ABDOMINAL 8X10 ST (GAUZE/BANDAGES/DRESSINGS) ×1 IMPLANT
ELECT REM PT RETURN 15FT ADLT (MISCELLANEOUS) ×3 IMPLANT
EVACUATOR 1/8 PVC DRAIN (DRAIN) IMPLANT
GAUZE SPONGE 4X4 12PLY STRL (GAUZE/BANDAGES/DRESSINGS) ×1 IMPLANT
GLOVE ORTHO TXT STRL SZ7.5 (GLOVE) ×3 IMPLANT
GLOVE SURG ORTHO 8.5 STRL (GLOVE) ×3 IMPLANT
GOWN STRL REUS W/TWL LRG LVL3 (GOWN DISPOSABLE) ×6 IMPLANT
HANDPIECE INTERPULSE COAX TIP (DISPOSABLE) ×3
KIT BASIN OR (CUSTOM PROCEDURE TRAY) ×3 IMPLANT
KIT TURNOVER KIT A (KITS) IMPLANT
MANIFOLD NEPTUNE II (INSTRUMENTS) ×3 IMPLANT
PACK ORTHO EXTREMITY (CUSTOM PROCEDURE TRAY) ×3 IMPLANT
PAD CAST 4YDX4 CTTN HI CHSV (CAST SUPPLIES) ×1 IMPLANT
PADDING CAST COTTON 4X4 STRL (CAST SUPPLIES)
PROTECTOR NERVE ULNAR (MISCELLANEOUS) ×3 IMPLANT
SET HNDPC FAN SPRY TIP SCT (DISPOSABLE) ×1 IMPLANT
SWAB COLLECTION DEVICE MRSA (MISCELLANEOUS) ×3 IMPLANT
SWAB CULTURE ESWAB REG 1ML (MISCELLANEOUS) ×3 IMPLANT
SYR CONTROL 10ML LL (SYRINGE) ×1 IMPLANT
TOWEL OR 17X26 10 PK STRL BLUE (TOWEL DISPOSABLE) ×4 IMPLANT

## 2018-12-10 NOTE — ED Triage Notes (Signed)
Pt reports being seen on 12/08/2018 for spider bite and was given steroids and antibiotics now left foot is swelling more now has ruptured and bleeding from left foot. With a putrid smell from foot and open wound measuring 5x1.5cm

## 2018-12-10 NOTE — ED Notes (Signed)
Patient made aware that urine sample is needed.  

## 2018-12-10 NOTE — ED Notes (Signed)
Consent form completed and at bedside.

## 2018-12-10 NOTE — Progress Notes (Signed)
A consult was received from an ED physician for vancomycin per pharmacy dosing.  The patient's profile has been reviewed for ht/wt/allergies/indication/available labs.   A one time order has been placed for Vancomycin 1500mg  iv x1.  Further antibiotics/pharmacy consults should be ordered by admitting physician if indicated.                       Thank you, Aleene Davidson Crowford 12/10/2018  10:02 PM

## 2018-12-10 NOTE — H&P (Signed)
Derek Jennings:[id number] DOB: [birth year] DOA: 12/10/2018     PCP: Elby Showers, MD   Outpatient Specialists:  NONE    Patient arrived to ER on 12/10/18 at 2001  Patient coming from: home Lives  With family    Chief Complaint:  Chief Complaint  Patient presents with  . bleeding from foot    HPI: Derek Jennings is a 33 y.o. male with medical history significant of polysubstance abuse, IVDU   Presented with   Foot pain on  6 days ago Monday started to turned pink He urgent care next day because  it was turning redHe was started on prednisone has been on prednisone for the past 5 days.  3 days ago   he came to [place] time he felt to have cellulitis and was started on clindamycin unfortunately he continued to take prednisone   It has increased in the foot turned black and now burst open  no sick contacts Mom works from home have been socially distancing  Last drug use was weeks ago Has been clean for the past few weeks    Infectious risk factors:  Reports none In ER RAPID COVID TEST PENDING    Regarding pertinent Chronic problems:  IVDU in the past  While in ER: Plain imaging showed no osteo  The following Work up has been ordered so far:  Orders Placed This Encounter  Procedures  . Blood culture (routine x 2)  . SARS Coronavirus 2 [facility] order, Performed in [facility] hospital lab)  . MRSA PCR Screening  . DG Foot Complete Left  . CBC with Differential  . Comprehensive metabolic panel  . Lactic acid, plasma  . Rapid urine drug screen (hospital performed)  . Diet NPO time specified  . Vital signs  . Notify physician  . Novel Coronavirus PPE supplies (droplet and contact precautions) yellow stethoscopes, surgical mask, gowns, surgical caps, face shield, goggles, CAPR - on the floor/unit, cleaning Sani-Cloth (orange and purple top)  . Place COVID-19 isolation sign and PPE checklist outside the DOOR  . Do not give nonsteroidal anti-inflammatory drugs  (NSAIDs)  . Patient to wear surgical mask during transportation  . Place working phone next to the patient  . Assess patient for ability to self-prone. If able (can move self in bed, ambulate) and stable (SpO2 and oxygen requirement):  Marland Kitchen Initiate Oral Care Protocol  . Initiate Carrier Fluid Protocol  . Consult to hospitalist  . Consult to orthopedic surgery  . Droplet and Contact precautions  . Admit to Inpatient (patient's expected length of stay will be greater than 2 midnights or inpatient only procedure)     Following Medications were ordered in ER: Medications  vancomycin (VANCOCIN) 1,500 mg in sodium chloride 0.9 % 500 mL IVPB (1,500 mg Intravenous New Bag/Given 12/10/18 2259)  HYDROmorphone (DILAUDID) injection 0.5 mg (has no administration in time range)  HYDROmorphone (DILAUDID) injection 1 mg (1 mg Intravenous Given 12/10/18 2126)  sodium chloride 0.9 % bolus 1,000 mL (0 mLs Intravenous Stopped 12/10/18 2248)  Tdap (BOOSTRIX) injection 0.5 mL (0.5 mLs Intramuscular Given 12/10/18 2129)        Consult Orders  (From admission, onward)         Start     Ordered   12/10/18 2158  Consult to hospitalist  Once    Provider:  Toy Baker, MD  Question Answer Comment  Place call to: [facility]   Reason for Consult Admit  12/10/18 2157          ER Provider Called:  Orthopedics   Dr. Veverly Fells  They Recommend admit to medicine   Will see  in ER and to take to the OR tonight   Significant initial  Findings: Abnormal Labs Reviewed  CBC WITH DIFFERENTIAL/PLATELET - Abnormal; Notable for the following components:      Result Value   WBC 16.3 (*)    Neutro Abs 12.0 (*)    Monocytes Absolute 2.4 (*)    Abs Immature Granulocytes 0.18 (*)    All other components within normal limits  COMPREHENSIVE METABOLIC PANEL - Abnormal; Notable for the following components:   Glucose, Bld 111 (*)    Calcium 8.3 (*)    Albumin 3.0 (*)    All other components within normal  limits     Otherwise labs showing:    Recent Labs  Lab 12/10/18 2046  NA 136  K 3.7  CO2 24  GLUCOSE 111*  BUN 19  CREATININE 0.81  CALCIUM 8.3*    Cr   Stable,  Lab Results  Component Value Date   CREATININE 0.81 12/10/2018   CREATININE 0.93 10/10/2016   CREATININE 0.76 06/20/2015    Recent Labs  Lab 12/10/18 2046  AST 17  ALT 32  ALKPHOS 81  BILITOT 0.6  PROT 6.8  ALBUMIN 3.0*   Lab Results  Component Value Date   CALCIUM 8.3 (L) 12/10/2018     WBC      Component Value Date/Time   WBC 16.3 (H) 12/10/2018 2046   ANC    Component Value Date/Time   NEUTROABS 12.0 (H) 12/10/2018 2046   ALC 1.2  Plt: Lab Results  Component Value Date   PLT 380 12/10/2018     Lactic Acid, Venous    Component Value Date/Time   LATICACIDVEN 0.6 12/10/2018 2246      HG/HCT  stable,       Component Value Date/Time   HGB 13.4 12/10/2018 2046   HCT 39.9 12/10/2018 2046          UA  not ordered   Plain imaging - no osteo, no gas  ECG: not obtained   ED Triage Vitals  Enc Vitals Group     BP 12/10/18 2012 (!) 146/84     Pulse Rate 12/10/18 2012 88     Resp 12/10/18 2012 17     Temp 12/10/18 2012 98.7 F (37.1 C)     Temp Source 12/10/18 2012 Oral     SpO2 12/10/18 2012 97 %     Weight 12/10/18 2013 185 lb (83.9 kg)     Height 12/10/18 2013 6' (1.829 m)     Head Circumference --      Peak Flow --      Pain Score 12/10/18 2013 10     Pain Loc --      Pain Edu? --      Excl. in [place]? --   TMAX(24)@       Latest  Blood pressure 136/81, pulse 85, temperature 98.7 F (37.1 C), temperature source Oral, resp. rate 18, height 6' (1.829 m), weight 83.9 kg, SpO2 97 %.    Hospitalist was called for admission for left foot cellulitis with wound formation    Review of Systems:    Pertinent positives include: leg pain  Constitutional:  No weight loss, night sweats, Fevers, chills, fatigue, weight loss  HEENT:  No headaches, Difficulty  swallowing,Tooth/dental problems,Sore throat,  No sneezing, itching, ear ache, nasal congestion, post nasal drip,  Cardio-vascular:  No chest pain, Orthopnea, PND, anasarca, dizziness, palpitations.no Bilateral lower extremity swelling  GI:  No heartburn, indigestion, abdominal pain, nausea, vomiting, diarrhea, change in bowel habits, loss of appetite, melena, blood in stool, hematemesis Resp:  no shortness of breath at rest. No dyspnea on exertion, No excess mucus, no productive cough, No non-productive cough, No coughing up of blood.No change in color of mucus.No wheezing. Skin:  no rash or lesions. No jaundice GU:  no dysuria, change in color of urine, no urgency or frequency. No straining to urinate.  No flank pain.  Musculoskeletal:  No joint pain or no joint swelling. No decreased range of motion. No back pain.  Psych:  No change in mood or affect. No depression or anxiety. No memory loss.  Neuro: no localizing neurological complaints, no tingling, no weakness, no double vision, no gait abnormality, no slurred speech, no confusion  All systems reviewed and apart from Spry all are negative  Past Medical History:   Past Medical History:  Diagnosis Date  . ADD (attention deficit disorder)   . Allergy   . Hyperhidrosis       Past Surgical History:  Procedure Laterality Date  . ADENOIDECTOMY      Social History:  Ambulatory independently       reports that he has been smoking. He has been smoking about 0.20 packs per day. He has quit using smokeless tobacco. He reports current alcohol use. He reports that he does not use drugs.     Family History:   Family History  Problem Relation Age of Onset  . Hypertension Mother   . Heart disease Father   . Stroke Father   . Diabetes Other     Allergies: Allergies  Allergen Reactions  . Amoxicillin Hives  . Penicillins Hives and Rash    Did it involve swelling of the face/tongue/throat, SOB, or low BP? Unknown Did it  involve sudden or severe rash/hives, skin peeling, or any reaction on the inside of your mouth or nose? Unknown Did you need to seek medical attention at a hospital or doctor's office? Unknown When did it last happen? If all above answers are "NO", may proceed with cephalosporin use.      Prior to Admission medications   Medication Sig Start Date End Date Taking? Authorizing Provider  clindamycin (CLEOCIN) 300 MG capsule Take 1 capsule (300 mg total) by mouth 3 (three) times daily for 5 days. 12/08/18 12/13/18  Henderly, Britni A, PA-C  doxycycline (VIBRA-TABS) 100 MG tablet Take 1 tablet (100 mg total) by mouth 2 (two) times daily. Patient not taking: Reported on 11/22/2018 07/03/15   Elby Showers, MD  glycopyrrolate (ROBINUL) 2 MG tablet Take 2 mg by mouth daily.      [provider]  loratadine (CLARITIN) 10 MG tablet Take 10 mg by mouth daily.    [provider]  predniSONE (STERAPRED UNI-PAK 21 TAB) 10 MG (21) TBPK tablet Take 10 mg by mouth See admin instructions. Listed on package 12/06/18   [provider]  promethazine (PHENERGAN) 25 MG tablet Take 1 tablet (25 mg total) by mouth every 6 (six) hours as needed for nausea or vomiting. Patient not taking: Reported on 11/22/2018 06/20/15   Elby Showers, MD   Physical Exam: Blood pressure 136/81, pulse 85, temperature 98.7 F (37.1 C), temperature source Oral, resp. rate 18, height 6' (1.829 m), weight 83.9 kg, SpO2 97 %.  1. General:  in No  Acute distress   well -appearing 2. Psychological: Alert and   Oriented 3. Head/ENT:     Dry Mucous Membranes                          Head Non traumatic, neck supple                          Poor Dentition 4. SKIN:   decreased Skin turgor,  Skin erythema of left foot with drainage and area of necrosis    5. Heart: Regular rate and rhythm no Murmur, no Rub or gallop 6. Lungs: no wheezes or crackles   7. Abdomen: Soft,  non-tender, Non distended    8. Lower  extremities: no clubbing, cyanosis, significant swelling of left foot with necrotic area is severe drainage 9. Neurologically Grossly intact, moving all 4 extremities equally   10. MSK: Normal range of motion   All other LABS:     Recent Labs  Lab 12/10/18 2046  WBC 16.3*  NEUTROABS 12.0*  HGB 13.4  HCT 39.9  MCV 92.4  PLT 380     Recent Labs  Lab 12/10/18 2046  NA 136  K 3.7  CL 103  CO2 24  GLUCOSE 111*  BUN 19  CREATININE 0.81  CALCIUM 8.3*     Recent Labs  Lab 12/10/18 2046  AST 17  ALT 32  ALKPHOS 81  BILITOT 0.6  PROT 6.8  ALBUMIN 3.0*       Cultures:    Component Value Date/Time   SDES  12/08/2018 1738    BLOOD LEFT ANTECUBITAL Performed at [facility] [address], [place], [place] [address]    SPECREQUEST  12/08/2018 1738    BOTTLES DRAWN AEROBIC AND ANAEROBIC Blood Culture adequate volume Performed at [facility] [place] [address] [place], [place] [address]    CULT  12/08/2018 1738    NO GROWTH < 24 HOURS Performed at [facility] [address] [place], [place] [address]    REPTSTATUS PENDING 12/08/2018 1738     Radiological Exams on Admission: Dg Foot Complete Left  Result Date: 12/10/2018 CLINICAL DATA:  Increased soft tissue swelling with possible underlying infection. EXAM: LEFT FOOT - COMPLETE 3+ VIEW COMPARISON:  12/08/2018 FINDINGS: No acute bony abnormality is noted. Considerable soft tissue swelling is noted along the dorsum of the foot with mild soft tissue irregularity consistent with the given clinical history. No radiopaque foreign body is noted. No changes of osteomyelitis are seen. IMPRESSION: Soft tissue infection along the dorsum of the foot. No acute bony abnormality is noted. Electronically Signed   By: Inez Catalina M.D.   On: 12/10/2018 21:53    Chart has been reviewed    Assessment/Plan  33 y.o. male with medical history significant of polysubstance abuse, IVDU   Admitted for left foot cellulitis   Present on Admission: . Cellulitis and abscess of foot -appreciate orthopedics consult plan to take to the OR tonight for washout.  Initiate broad-spectrum antibiotics 12/10/18 Vancomycin ceftriaxone and Flagyl Await results of wound culture/blood culture to further assess.  Check for MRSA  Pain management with IV medication given severe pain and large wound Currently well controlled with a dose of Dilaudid . Sepsis ([place]) -  -SIRS criteria met with  elevated white blood cell count,     without evidence of end organ  damage  -Most likely source being wound - Obtain serial lactic acid and procalcitonin level.  - Initiate IV antibiotics   - await results of blood culture  - Rehydrate     History of drug abuse currently states trying to stay clean.  Would benefit from social work consult  Other plan as per orders.  DVT prophylaxis:  SCD     Code Status:  FULL CODE  as per patient   I had personally discussed CODE STATUS with patient    Family Communication:   Family not at  Bedside     Disposition Plan:      To home once workup is complete and patient is stable                    Wound care  consulted                    Consults called: Orthopedics   Admission status:  ED Disposition    ED Disposition Condition [place]: [place] [[id number]]  Level of Care: Med-Surg [16]  Covid Evaluation: Person Under Investigation (PUI)  Isolation Risk Level: Low Risk/Droplet (Less than 4L [place] supplementation)  Diagnosis: Wound infection [834196]  Admitting Physician: Toy Baker [[id number]]  Attending Physician: Toy Baker [[id number]]  Estimated length of stay: 3 - 4 days  Certification:: I certify this patient will need inpatient services for at least 2 midnights  PT Class (Do Not Modify): Inpatient [101]  PT Acc Code (Do Not Modify): Private [1]          inpatient     Expect 2 midnight stay  secondary to severity of patient's current illness including      Severe lab/radiological/exam abnormalities including:  leukocytosis   and extensive comorbidities including:  substance abuse   That are currently affecting medical management.   I expect  patient to be hospitalized for 2 midnights requiring inpatient medical care.  Patient is at high risk for adverse outcome (such as loss of life or disability) if not treated.  Indication for inpatient stay as follows:    severe pain requiring acute inpatient management,  inability to maintain oral hydration    Need for operative/procedural  intervention    Need for IV antibiotics, IV fluid  IV pain medications     Level of care     medical floor        Precautions: For now on Droplet and Contact precautions will call with results back if negative will be able to discontinue  PPE: Used by the provider:   P100  eye Goggles,  Gloves    Dartanyan Deasis 12/10/2018, 11:51 PM    [facility]     after 2 AM please page floor coverage PA If 7AM-7PM, please contact the day team taking care of the patient using [email or web address]

## 2018-12-10 NOTE — Anesthesia Preprocedure Evaluation (Addendum)
Anesthesia Evaluation  Patient identified by MRN, date of birth, ID band Patient awake    Reviewed: Allergy & Precautions, NPO status , Patient's Chart, lab work & pertinent test results  Airway Mallampati: I  TM Distance: >3 FB Neck ROM: Full    Dental no notable dental hx. (+) Teeth Intact, Dental Advisory Given   Pulmonary Current Smoker,  Covid  Test Pend   Pulmonary exam normal breath sounds clear to auscultation       Cardiovascular Normal cardiovascular exam Rhythm:Regular Rate:Normal     Neuro/Psych PSYCHIATRIC DISORDERS Hx of ADD   GI/Hepatic   Endo/Other  Hgb 13.4  Renal/GU Cr 0.81 K+ 3.7     Musculoskeletal   Abdominal   Peds  Hematology   Anesthesia Other Findings ALL to PCN  Reproductive/Obstetrics                            Anesthesia Physical Anesthesia Plan  ASA: III  Anesthesia Plan: General   Post-op Pain Management:    Induction: Cricoid pressure planned, Rapid sequence and Intravenous  PONV Risk Score and Plan: 2 and Treatment may vary due to age or medical condition, Ondansetron and Dexamethasone  Airway Management Planned: Oral ETT  Additional Equipment:   Intra-op Plan:   Post-operative Plan: Extubation in OR  Informed Consent: I have reviewed the patients History and Physical, chart, labs and discussed the procedure including the risks, benefits and alternatives for the proposed anesthesia with the patient or authorized representative who has indicated his/her understanding and acceptance.     Dental advisory given  Plan Discussed with: CRNA  Anesthesia Plan Comments: (I & D L dorsum Foot wound w GA)        Anesthesia Quick Evaluation

## 2018-12-10 NOTE — Consult Note (Signed)
Reason for Consult:Left foot infection Referring Physician: EDP  Derek Jennings is an 33 y.o. male.  HPI: 33 yo male with history of IVDA who presents with a history of left foot swelling and erythema. Initially placed on steroids by urgent care and then when it worsened he was placed on po Clindamycin.  The foot gradually worsened to the point where it began to open and drain.   Past Medical History:  Diagnosis Date  . ADD (attention deficit disorder)   . Allergy   . Hyperhidrosis     Past Surgical History:  Procedure Laterality Date  . ADENOIDECTOMY      Family History  Problem Relation Age of Onset  . Hypertension Mother   . Heart disease Father   . Stroke Father   . Diabetes Other     Social History:  reports that he has been smoking. He has been smoking about 0.20 packs per day. He has quit using smokeless tobacco. He reports current alcohol use. He reports that he does not use drugs.  Allergies:  Allergies  Allergen Reactions  . Amoxicillin Hives  . Penicillins Hives and Rash    Did it involve swelling of the face/tongue/throat, SOB, or low BP? Unknown Did it involve sudden or severe rash/hives, skin peeling, or any reaction on the inside of your mouth or nose? Unknown Did you need to seek medical attention at a hospital or doctor's office? Unknown When did it last happen? If all above answers are "NO", may proceed with cephalosporin use.     Medications: I have reviewed the patient's current medications.  Results for orders placed or performed during the hospital encounter of 12/10/18 (from the past 48 hour(s))  CBC with Differential     Status: Abnormal   Collection Time: 12/10/18  8:46 PM  Result Value Ref Range   WBC 16.3 (H) 4.0 - 10.5 K/uL   RBC 4.32 4.22 - 5.81 MIL/uL   Hemoglobin 13.4 13.0 - 17.0 g/dL   HCT 16.139.9 09.639.0 - 04.552.0 %   MCV 92.4 80.0 - 100.0 fL   MCH 31.0 26.0 - 34.0 pg   MCHC 33.6 30.0 - 36.0 g/dL   RDW 40.913.1 81.111.5 - 91.415.5 %   Platelets  380 150 - 400 K/uL   nRBC 0.0 0.0 - 0.2 %   Neutrophils Relative % 74 %   Neutro Abs 12.0 (H) 1.7 - 7.7 K/uL   Lymphocytes Relative 10 %   Lymphs Abs 1.6 0.7 - 4.0 K/uL   Monocytes Relative 15 %   Monocytes Absolute 2.4 (H) 0.1 - 1.0 K/uL   Eosinophils Relative 0 %   Eosinophils Absolute 0.0 0.0 - 0.5 K/uL   Basophils Relative 0 %   Basophils Absolute 0.1 0.0 - 0.1 K/uL   Immature Granulocytes 1 %   Abs Immature Granulocytes 0.18 (H) 0.00 - 0.07 K/uL    Comment: Performed at [facility], [address] [place], [place] [id number]  Comprehensive metabolic panel     Status: Abnormal   Collection Time: 12/10/18  8:46 PM  Result Value Ref Range   Sodium 136 135 - 145 mmol/L   Potassium 3.7 3.5 - 5.1 mmol/L   Chloride 103 98 - 111 mmol/L   CO2 24 22 - 32 mmol/L   Glucose, Bld 111 (H) 70 - 99 mg/dL   BUN 19 6 - 20 mg/dL   Creatinine, Ser 6.210.81 0.61 - 1.24 mg/dL   Calcium 8.3 (L) 8.9 - 10.3 mg/dL  Total Protein 6.8 6.5 - 8.1 g/dL   Albumin 3.0 (L) 3.5 - 5.0 g/dL   AST 17 15 - 41 U/L   ALT 32 0 - 44 U/L   Alkaline Phosphatase 81 38 - 126 U/L   Total Bilirubin 0.6 0.3 - 1.2 mg/dL   GFR calc non Af Amer >60 >60 mL/min   GFR calc Af Amer >60 >60 mL/min   Anion gap 9 5 - 15    Comment: Performed at [facility], [address], [place], [place] [address]  Lactic acid, plasma     Status: None   Collection Time: 12/10/18  8:46 PM  Result Value Ref Range   Lactic Acid, Venous 0.9 0.5 - 1.9 mmol/L    Comment: Performed at [facility], [address], [place], [place] [address]    Dg Foot Complete Left  Result Date: 12/10/2018 CLINICAL DATA:  Increased soft tissue swelling with possible underlying infection. EXAM: LEFT FOOT - COMPLETE 3+ VIEW COMPARISON:  12/08/2018 FINDINGS: No acute bony abnormality is noted. Considerable soft tissue swelling is noted along the dorsum of the foot with mild soft tissue irregularity consistent with  the given clinical history. No radiopaque foreign body is noted. No changes of osteomyelitis are seen. IMPRESSION: Soft tissue infection along the dorsum of the foot. No acute bony abnormality is noted. Electronically Signed   By: Alcide Clever M.D.   On: 12/10/2018 21:53    ROS Blood pressure 136/81, pulse 85, temperature 98.7 F (37.1 C), temperature source Oral, resp. rate 18, height 6' (1.829 m), weight 83.9 kg, SpO2 97 %. Physical Exam Patient is a healthy appearing male in moderate distress. Left foot exam shows severe swelling and erythema extending into the ankle area. He has a necrotic area over the dorsal foot which is actively draining.  He is able to wiggle toes. Sensation is diminished around the areas of swelling.  Assessment/Plan: Left foot infection, likely polymicrobial and possibly related to IVDA although patient denies injecting there. I discussed with the patient that he has a limb threatening infection that will require emergent surgery tonight for I+D and likely Wound Vac placement.   [name] 12/10/2018, 10:54 PM

## 2018-12-10 NOTE — ED Provider Notes (Signed)
[facility]-EMERGENCY DEPT Provider Note   CSN: [id number] Arrival date & time: 12/10/18  2001    History   Chief Complaint Chief Complaint  Patient presents with  . bleeding from foot    HPI Derek Jennings is a 33 y.o. male.     The history is provided by the patient and medical records. No language interpreter was used.  Rash  Location:  Foot Foot rash location:  Top of L foot Quality: draining, redness and swelling   Severity:  Severe Onset quality:  Gradual Duration:  6 days Timing:  Constant Progression:  Worsening Chronicity:  New Relieved by:  Nothing Worsened by:  Nothing Associated symptoms: no abdominal pain, no diarrhea, no fatigue, no fever, no headaches, no nausea, no shortness of breath, not vomiting and not wheezing     Past Medical History:  Diagnosis Date  . ADD (attention deficit disorder)   . Allergy   . Hyperhidrosis     Patient Active Problem List   Diagnosis Date Noted  . Allergic rhinitis 12/11/2011  . Hyperhidrosis 08/01/2011    Past Surgical History:  Procedure Laterality Date  . ADENOIDECTOMY          Home Medications    Prior to Admission medications   Medication Sig Start Date End Date Taking? Authorizing Provider  clindamycin (CLEOCIN) 300 MG capsule Take 1 capsule (300 mg total) by mouth 3 (three) times daily for 5 days. 12/08/18 12/13/18  Henderly, Britni A, PA-C  doxycycline (VIBRA-TABS) 100 MG tablet Take 1 tablet (100 mg total) by mouth 2 (two) times daily. Patient not taking: Reported on 11/22/2018 07/03/15   Margaree Mackintosh, MD  glycopyrrolate (ROBINUL) 2 MG tablet Take 2 mg by mouth daily.      [provider]  loratadine (CLARITIN) 10 MG tablet Take 10 mg by mouth daily.    [provider]  promethazine (PHENERGAN) 25 MG tablet Take 1 tablet (25 mg total) by mouth every 6 (six) hours as needed for nausea or vomiting. Patient not taking: Reported on 11/22/2018 06/20/15   Margaree Mackintosh, MD    Family History Family History  Problem Relation Age of Onset  . Heart disease Father     Social History Social History   Tobacco Use  . Smoking status: Current Every Day Smoker    Packs/day: 0.20  . Smokeless tobacco: Former Engineer, water Use Topics  . Alcohol use: Yes    Comment: socially  . Drug use: No     Allergies   Amoxicillin   Review of Systems Review of Systems  Constitutional: Negative for chills, diaphoresis, fatigue and fever.  HENT: Negative for congestion.   Respiratory: Negative for cough, chest tightness, shortness of breath and wheezing.   Cardiovascular: Negative for chest pain and palpitations.  Gastrointestinal: Negative for abdominal pain, constipation, diarrhea, nausea and vomiting.  Musculoskeletal: Negative for back pain, neck pain and neck stiffness.  Skin: Positive for rash and wound.  Neurological: Negative for headaches.  All other systems reviewed and are negative.    Physical Exam Updated Vital Signs BP (!) 146/84 (BP Location: Left Arm)   Pulse 88   Temp 98.7 F (37.1 C) (Oral)   Resp 17   Ht 6' (1.829 m)   Wt 83.9 kg   SpO2 97%   BMI 25.09 kg/m   Physical Exam Vitals signs and nursing note reviewed.  Constitutional:      General: He is not in acute distress.  Appearance: He is well-developed. He is not ill-appearing, toxic-appearing or diaphoretic.  HENT:     Head: Normocephalic and atraumatic.     Nose: No congestion or rhinorrhea.     Mouth/Throat:     Pharynx: No oropharyngeal exudate or posterior oropharyngeal erythema.  Eyes:     Conjunctiva/sclera: Conjunctivae normal.     Pupils: Pupils are equal, round, and reactive to light.  Neck:     Musculoskeletal: Neck supple. No muscular tenderness.  Cardiovascular:     Rate and Rhythm: Normal rate and regular rhythm.     Pulses: Normal pulses.     Heart sounds: No murmur.  Pulmonary:     Effort: Pulmonary effort is normal. No respiratory distress.      Breath sounds: Normal breath sounds. No wheezing, rhonchi or rales.  Chest:     Chest wall: No tenderness.  Abdominal:     General: Abdomen is flat.     Palpations: Abdomen is soft.     Tenderness: There is no abdominal tenderness.  Musculoskeletal:        General: Swelling and tenderness present.     Right foot: Normal capillary refill. Tenderness, swelling and laceration present. No crepitus.       Feet:  Skin:    General: Skin is warm and dry.     Capillary Refill: Capillary refill takes less than 2 seconds.     Findings: Erythema and rash present.  Neurological:     General: No focal deficit present.     Mental Status: He is alert and oriented to person, place, and time.  Psychiatric:        Mood and Affect: Mood normal.           ED Treatments / Results  Labs (all labs ordered are listed, but only abnormal results are displayed) Labs Reviewed  CBC WITH DIFFERENTIAL/PLATELET - Abnormal; Notable for the following components:      Result Value   WBC 16.3 (*)    Neutro Abs 12.0 (*)    Monocytes Absolute 2.4 (*)    Abs Immature Granulocytes 0.18 (*)    All other components within normal limits  COMPREHENSIVE METABOLIC PANEL - Abnormal; Notable for the following components:   Glucose, Bld 111 (*)    Calcium 8.3 (*)    Albumin 3.0 (*)    All other components within normal limits  CULTURE, BLOOD (ROUTINE X 2)  CULTURE, BLOOD (ROUTINE X 2)  SARS CORONAVIRUS 2 (HOSPITAL ORDER, PERFORMED IN [facility])  LACTIC ACID, PLASMA  LACTIC ACID, PLASMA  RAPID URINE DRUG SCREEN, HOSP PERFORMED    EKG None  Radiology Dg Foot Complete Left  Result Date: 12/10/2018 CLINICAL DATA:  Increased soft tissue swelling with possible underlying infection. EXAM: LEFT FOOT - COMPLETE 3+ VIEW COMPARISON:  12/08/2018 FINDINGS: No acute bony abnormality is noted. Considerable soft tissue swelling is noted along the dorsum of the foot with mild soft tissue irregularity  consistent with the given clinical history. No radiopaque foreign body is noted. No changes of osteomyelitis are seen. IMPRESSION: Soft tissue infection along the dorsum of the foot. No acute bony abnormality is noted. Electronically Signed   By: Alcide CleverMark  Lukens M.D.   On: 12/10/2018 21:53    Procedures Procedures (including critical care time)  CRITICAL CARE Performed by: Canary Brimhristopher J Gerianne Simonet Total critical care time: 35 minutes Critical care time was exclusive of separately billable procedures and treating other patients. Critical care was necessary to treat or  prevent imminent or life-threatening deterioration. Critical care was time spent personally by me on the following activities: development of treatment plan with patient and/or surrogate as well as nursing, discussions with consultants, evaluation of patient's response to treatment, examination of patient, obtaining history from patient or surrogate, ordering and performing treatments and interventions, ordering and review of laboratory studies, ordering and review of radiographic studies, pulse oximetry and re-evaluation of patient's condition.   Medications Ordered in ED Medications  vancomycin (VANCOCIN) 1,500 mg in sodium chloride 0.9 % 500 mL IVPB (has no administration in time range)  HYDROmorphone (DILAUDID) injection 1 mg (1 mg Intravenous Given 12/10/18 2126)  sodium chloride 0.9 % bolus 1,000 mL (1,000 mLs Intravenous New Bag/Given 12/10/18 2126)  Tdap (BOOSTRIX) injection 0.5 mL (0.5 mLs Intramuscular Given 12/10/18 2129)     Initial Impression / Assessment and Plan / ED Course  I have reviewed the triage vital signs and the nursing notes.  Pertinent labs & imaging results that were available during my care of the patient were reviewed by me and considered in my medical decision making (see chart for details).        Akim Ivan is a 33 y.o. male with a past medical history significant for IV drug abuse who presents with  worsening skin infection of the left foot.  He reports that starting Monday, 6 days ago, he developed redness and pain in his left foot.  He reports that he initially went to an urgent care and was given steroids.  He then came in 2 days ago to the emergency department and was found to have likely cellulitis developing.  He was then instructed to come back if things worsen.  He reports over the last 24 hours symptoms have drastically worsened and his foot has now burst open with blood and possible purulence.  There is a foul smell coming from the wound.  Patient reports the pain is now "21 out of 10".  He denies any fevers, chills, cough, congestion, urinary symptoms or GI symptoms.  Denies any nausea or vomiting.  He reports the pain has been slowly extending up to his foot and past his ankle.  He reports erythema has been creeping up as well.  He is having difficulty walking due to the pain but denies any numbness or weakness.  He has never had this before.  He denies any injection into the site and denies any visualization of a spider or insect bite at the onset.  He is unsure of his last tetanus shot.  On exam, patient has a foul-smelling wound to the dorsum of his left foot.  He has surrounding erythema and edema all over the foot.  He is able to wiggle his toes and has sensation in the feet.  He has a palpable DP and PT pulse.  He did not have crepitance on my exam.  He has clear blood clot and concern for necrotic tissue in the wound.  Possible purulence.  Lungs clear chest nontender.  Abdomen nontender.  Back nontender.  Clinically I am concerned about worsening infection in the foot.  He has been on clindamycin thus he is failing outpatient antibiotics at this time.  He denies history of diabetes however will have screening laboratory testing.  Will have x-ray to look for subcutaneous gas or osteomyelitis development.  Anticipate admission for IV antibiotics however patient may need trip to the OR for  washout if subcutaneous gas is discovered or osteomyelitis is seen.   He  will be given pain medicine and fluids initially.  Tetanus will be updated.  9:57 PM X-ray just turned showing no subcutaneous gas or osteomyelitis.  Suspect severe cellulitis with possible area of necrosis.  Patient will be started on antibiotics.  Leukocytosis was found.  Kidney function normal.  Electrolytes unremarkable aside from mild hypocalcemia.  Blood cultures obtained.  Lactic acid is not elevated.  Hospitalist team called for admission for IV antibiotics and monitoring of his wound.  10:22 PM Hospitalist team will admit however they requested I consult orthopedics for possible washout given the area possible necrosis and deep infection of the foot.  Ortho request patient be n.p.o. and they will come see the patient for washout.  Patient will be admitted after washout.   Final Clinical Impressions(s) / ED Diagnoses   Final diagnoses:  Cellulitis of left lower extremity    ED Discharge Orders    None      Clinical Impression: 1. Cellulitis of left lower extremity     Disposition: Admit  This note was prepared with assistance of Dragon voice recognition software. Occasional wrong-word or sound-a-like substitutions may have occurred due to the inherent limitations of voice recognition software.      Jaquavious Mercer, Canary Brim, MD 12/11/18 [phone]

## 2018-12-11 ENCOUNTER — Inpatient Hospital Stay (HOSPITAL_COMMUNITY): Payer: Self-pay | Admitting: Certified Registered Nurse Anesthetist

## 2018-12-11 ENCOUNTER — Encounter (HOSPITAL_COMMUNITY): Payer: Self-pay

## 2018-12-11 ENCOUNTER — Other Ambulatory Visit: Payer: Self-pay

## 2018-12-11 DIAGNOSIS — D649 Anemia, unspecified: Secondary | ICD-10-CM

## 2018-12-11 DIAGNOSIS — R739 Hyperglycemia, unspecified: Secondary | ICD-10-CM

## 2018-12-11 DIAGNOSIS — L089 Local infection of the skin and subcutaneous tissue, unspecified: Secondary | ICD-10-CM | POA: Insufficient documentation

## 2018-12-11 LAB — MRSA PCR SCREENING: MRSA by PCR: POSITIVE — AB

## 2018-12-11 LAB — CBC
HCT: 37.2 % — ABNORMAL LOW (ref 39.0–52.0)
Hemoglobin: 12.2 g/dL — ABNORMAL LOW (ref 13.0–17.0)
MCH: 30.7 pg (ref 26.0–34.0)
MCHC: 32.8 g/dL (ref 30.0–36.0)
MCV: 93.7 fL (ref 80.0–100.0)
Platelets: 393 10*3/uL (ref 150–400)
RBC: 3.97 MIL/uL — ABNORMAL LOW (ref 4.22–5.81)
RDW: 13.2 % (ref 11.5–15.5)
WBC: 14.2 10*3/uL — ABNORMAL HIGH (ref 4.0–10.5)
nRBC: 0 % (ref 0.0–0.2)

## 2018-12-11 LAB — RAPID URINE DRUG SCREEN, HOSP PERFORMED
Amphetamines: NOT DETECTED
Barbiturates: NOT DETECTED
Benzodiazepines: NOT DETECTED
Cocaine: NOT DETECTED
Opiates: POSITIVE — AB
Tetrahydrocannabinol: NOT DETECTED

## 2018-12-11 LAB — COMPREHENSIVE METABOLIC PANEL
ALT: 25 U/L (ref 0–44)
AST: 14 U/L — ABNORMAL LOW (ref 15–41)
Albumin: 2.6 g/dL — ABNORMAL LOW (ref 3.5–5.0)
Alkaline Phosphatase: 77 U/L (ref 38–126)
Anion gap: 9 (ref 5–15)
BUN: 13 mg/dL (ref 6–20)
CO2: 25 mmol/L (ref 22–32)
Calcium: 7.9 mg/dL — ABNORMAL LOW (ref 8.9–10.3)
Chloride: 104 mmol/L (ref 98–111)
Creatinine, Ser: 0.68 mg/dL (ref 0.61–1.24)
GFR calc Af Amer: >60 mL/min (ref >60)
GFR calc non Af Amer: >60 mL/min (ref >60)
Glucose, Bld: 118 mg/dL — ABNORMAL HIGH (ref 70–99)
Potassium: 3.8 mmol/L (ref 3.5–5.1)
Sodium: 138 mmol/L (ref 135–145)
Total Bilirubin: 0.5 mg/dL (ref 0.3–1.2)
Total Protein: 6.1 g/dL — ABNORMAL LOW (ref 6.5–8.1)

## 2018-12-11 LAB — HEMOGLOBIN A1C
Hgb A1c MFr Bld: 5.5 % (ref 4.8–5.6)
Mean Plasma Glucose: 111.15 mg/dL

## 2018-12-11 LAB — APTT: aPTT: 32 seconds (ref 24–36)

## 2018-12-11 LAB — SEDIMENTATION RATE: Sed Rate: 41 mm/hr — ABNORMAL HIGH (ref 0–16)

## 2018-12-11 LAB — PHOSPHORUS: Phosphorus: 3.3 mg/dL (ref 2.5–4.6)

## 2018-12-11 LAB — PROCALCITONIN: Procalcitonin: 0.11 ng/mL

## 2018-12-11 LAB — PROTIME-INR
INR: 1.2 (ref 0.8–1.2)
Prothrombin Time: 14.6 seconds (ref 11.4–15.2)

## 2018-12-11 LAB — SARS CORONAVIRUS 2 BY RT PCR (HOSPITAL ORDER, PERFORMED IN ~~LOC~~ HOSPITAL LAB): SARS Coronavirus 2: NEGATIVE

## 2018-12-11 LAB — PREALBUMIN: Prealbumin: 12.3 mg/dL — ABNORMAL LOW (ref 18–38)

## 2018-12-11 LAB — MAGNESIUM: Magnesium: 2 mg/dL (ref 1.7–2.4)

## 2018-12-11 LAB — HIV ANTIBODY (ROUTINE TESTING W REFLEX): HIV Screen 4th Generation wRfx: NONREACTIVE

## 2018-12-11 LAB — C-REACTIVE PROTEIN: CRP: 16.8 mg/dL — ABNORMAL HIGH (ref <1.0)

## 2018-12-11 LAB — TSH: TSH: 1.749 u[IU]/mL (ref 0.350–4.500)

## 2018-12-11 MED ORDER — ONDANSETRON HCL 4 MG/2ML IJ SOLN
INTRAMUSCULAR | Status: DC | PRN
  Administered 2018-12-11: 4 mg via INTRAVENOUS

## 2018-12-11 MED ORDER — LACTATED RINGERS IV SOLN
INTRAVENOUS | Status: DC | PRN
  Administered 2018-12-11: 01:00:00 via INTRAVENOUS

## 2018-12-11 MED ORDER — METRONIDAZOLE IN NACL 5-0.79 MG/ML-% IV SOLN
INTRAVENOUS | Status: AC
  Filled 2018-12-11: qty 100

## 2018-12-11 MED ORDER — ACETAMINOPHEN 325 MG PO TABS
650.0000 mg | ORAL_TABLET | Freq: Four times a day (QID) | ORAL | Status: DC | PRN
  Administered 2018-12-12: 650 mg via ORAL
  Filled 2018-12-11 (×2): qty 2

## 2018-12-11 MED ORDER — METRONIDAZOLE IN NACL 5-0.79 MG/ML-% IV SOLN
500.0000 mg | Freq: Three times a day (TID) | INTRAVENOUS | Status: DC
  Administered 2018-12-11 (×2): 500 mg via INTRAVENOUS
  Filled 2018-12-11 (×7): qty 100

## 2018-12-11 MED ORDER — ONDANSETRON HCL 4 MG/2ML IJ SOLN: 4.0000 mg | Freq: Four times a day (QID) | INTRAMUSCULAR | Status: DC | PRN

## 2018-12-11 MED ORDER — SODIUM CHLORIDE 0.9 % IR SOLN
Status: DC | PRN
  Administered 2018-12-11: 3000 mL

## 2018-12-11 MED ORDER — SUCCINYLCHOLINE CHLORIDE 200 MG/10ML IV SOSY
PREFILLED_SYRINGE | INTRAVENOUS | Status: DC | PRN
  Administered 2018-12-11: 100 mg via INTRAVENOUS

## 2018-12-11 MED ORDER — METOCLOPRAMIDE HCL 5 MG/ML IJ SOLN: 5.0000 mg | Freq: Three times a day (TID) | INTRAMUSCULAR | Status: DC | PRN

## 2018-12-11 MED ORDER — METOCLOPRAMIDE HCL 5 MG PO TABS: 5.0000 mg | ORAL_TABLET | Freq: Three times a day (TID) | ORAL | Status: DC | PRN

## 2018-12-11 MED ORDER — ACETAMINOPHEN 650 MG RE SUPP: 650.0000 mg | Freq: Four times a day (QID) | RECTAL | Status: DC | PRN

## 2018-12-11 MED ORDER — ENOXAPARIN SODIUM 40 MG/0.4ML ~~LOC~~ SOLN
40.0000 mg | SUBCUTANEOUS | Status: DC
  Administered 2018-12-12 – 2018-12-15 (×3): 40 mg via SUBCUTANEOUS
  Filled 2018-12-11 (×3): qty 0.4

## 2018-12-11 MED ORDER — HYDROMORPHONE HCL 1 MG/ML IJ SOLN
1.0000 mg | INTRAMUSCULAR | Status: DC | PRN
  Administered 2018-12-11 (×2): 1 mg via INTRAVENOUS
  Filled 2018-12-11 (×2): qty 1

## 2018-12-11 MED ORDER — ONDANSETRON HCL 4 MG PO TABS: 4.0000 mg | ORAL_TABLET | Freq: Four times a day (QID) | ORAL | Status: DC | PRN

## 2018-12-11 MED ORDER — SODIUM CHLORIDE 0.9 % IV SOLN
2.0000 g | INTRAVENOUS | Status: DC
  Administered 2018-12-11 – 2018-12-15 (×5): 2 g via INTRAVENOUS
  Filled 2018-12-11 (×5): qty 20

## 2018-12-11 MED ORDER — SODIUM CHLORIDE 0.9 % IV SOLN
INTRAVENOUS | Status: AC
  Filled 2018-12-11: qty 20

## 2018-12-11 MED ORDER — ONDANSETRON HCL 4 MG/2ML IJ SOLN: 4.0000 mg | Freq: Once | INTRAMUSCULAR | Status: DC | PRN

## 2018-12-11 MED ORDER — MIDAZOLAM HCL 5 MG/5ML IJ SOLN
INTRAMUSCULAR | Status: DC | PRN
  Administered 2018-12-11 (×2): 1 mg via INTRAVENOUS

## 2018-12-11 MED ORDER — PNEUMOCOCCAL VAC POLYVALENT 25 MCG/0.5ML IJ INJ
0.5000 mL | INJECTION | INTRAMUSCULAR | Status: AC
  Administered 2018-12-14: 0.5 mL via INTRAMUSCULAR
  Filled 2018-12-11: qty 0.5

## 2018-12-11 MED ORDER — PHENYLEPHRINE 40 MCG/ML (10ML) SYRINGE FOR IV PUSH (FOR BLOOD PRESSURE SUPPORT)
PREFILLED_SYRINGE | INTRAVENOUS | Status: DC | PRN
  Administered 2018-12-11: 80 ug via INTRAVENOUS
  Administered 2018-12-11 (×2): 120 ug via INTRAVENOUS
  Administered 2018-12-11 (×2): 80 ug via INTRAVENOUS

## 2018-12-11 MED ORDER — HYDROMORPHONE HCL 1 MG/ML IJ SOLN
1.0000 mg | Freq: Once | INTRAMUSCULAR | Status: AC
  Administered 2018-12-11: 16:00:00 1 mg via INTRAVENOUS
  Filled 2018-12-11: qty 1

## 2018-12-11 MED ORDER — ACETAMINOPHEN 10 MG/ML IV SOLN
1000.0000 mg | Freq: Once | INTRAVENOUS | Status: DC | PRN
  Administered 2018-12-11: 02:00:00 1000 mg via INTRAVENOUS

## 2018-12-11 MED ORDER — FENTANYL CITRATE (PF) 100 MCG/2ML IJ SOLN
INTRAMUSCULAR | Status: DC | PRN
  Administered 2018-12-11: 100 ug via INTRAVENOUS
  Administered 2018-12-11 (×3): 50 ug via INTRAVENOUS

## 2018-12-11 MED ORDER — LACTATED RINGERS IV SOLN: INTRAVENOUS | Status: DC

## 2018-12-11 MED ORDER — OXYCODONE HCL 5 MG PO TABS
5.0000 mg | ORAL_TABLET | Freq: Four times a day (QID) | ORAL | Status: DC | PRN
  Administered 2018-12-11 – 2018-12-14 (×4): 5 mg via ORAL
  Filled 2018-12-11 (×3): qty 1

## 2018-12-11 MED ORDER — 0.9 % SODIUM CHLORIDE (POUR BTL) OPTIME
TOPICAL | Status: DC | PRN
  Administered 2018-12-11: 1000 mL

## 2018-12-11 MED ORDER — SODIUM CHLORIDE 0.9 % IV SOLN
INTRAVENOUS | Status: DC
  Administered 2018-12-11: 04:00:00 via INTRAVENOUS

## 2018-12-11 MED ORDER — DOCUSATE SODIUM 100 MG PO CAPS
100.0000 mg | ORAL_CAPSULE | Freq: Two times a day (BID) | ORAL | Status: DC
  Administered 2018-12-11 – 2018-12-15 (×10): 100 mg via ORAL
  Filled 2018-12-11 (×10): qty 1

## 2018-12-11 MED ORDER — HYDROMORPHONE HCL 1 MG/ML IJ SOLN
0.2500 mg | INTRAMUSCULAR | Status: DC | PRN
  Administered 2018-12-11: 0.25 mg via INTRAVENOUS
  Administered 2018-12-11: 0.5 mg via INTRAVENOUS

## 2018-12-11 MED ORDER — LIDOCAINE 2% (20 MG/ML) 5 ML SYRINGE
INTRAMUSCULAR | Status: DC | PRN
  Administered 2018-12-11: 100 mg via INTRAVENOUS

## 2018-12-11 MED ORDER — ACETAMINOPHEN 10 MG/ML IV SOLN
INTRAVENOUS | Status: AC
  Filled 2018-12-11: qty 100

## 2018-12-11 MED ORDER — PROPOFOL 10 MG/ML IV BOLUS
INTRAVENOUS | Status: DC | PRN
  Administered 2018-12-11: 200 mg via INTRAVENOUS

## 2018-12-11 MED ORDER — HYDROCODONE-ACETAMINOPHEN 7.5-325 MG PO TABS: 1.0000 | ORAL_TABLET | Freq: Once | ORAL | Status: DC | PRN

## 2018-12-11 MED ORDER — VANCOMYCIN HCL 10 G IV SOLR
1250.0000 mg | Freq: Three times a day (TID) | INTRAVENOUS | Status: DC
  Administered 2018-12-11 – 2018-12-15 (×13): 1250 mg via INTRAVENOUS
  Filled 2018-12-11 (×17): qty 1250

## 2018-12-11 MED ORDER — PHENYLEPHRINE 40 MCG/ML (10ML) SYRINGE FOR IV PUSH (FOR BLOOD PRESSURE SUPPORT)
PREFILLED_SYRINGE | INTRAVENOUS | Status: AC
  Filled 2018-12-11: qty 20

## 2018-12-11 MED ORDER — HYDROMORPHONE HCL 1 MG/ML IJ SOLN
INTRAMUSCULAR | Status: AC
  Filled 2018-12-11: qty 1

## 2018-12-11 MED ORDER — MEPERIDINE HCL 50 MG/ML IJ SOLN: 6.2500 mg | INTRAMUSCULAR | Status: DC | PRN

## 2018-12-11 MED ORDER — SODIUM CHLORIDE 0.9 % IV SOLN
INTRAVENOUS | Status: DC
  Administered 2018-12-11: 12:00:00 via INTRAVENOUS

## 2018-12-11 MED ORDER — METRONIDAZOLE IN NACL 5-0.79 MG/ML-% IV SOLN
500.0000 mg | Freq: Three times a day (TID) | INTRAVENOUS | Status: DC
  Administered 2018-12-11 – 2018-12-15 (×12): 500 mg via INTRAVENOUS
  Filled 2018-12-11 (×12): qty 100

## 2018-12-11 NOTE — Brief Op Note (Signed)
12/11/2018  1:33 AM  PATIENT:  Derek Jennings  33 y.o. male  PRE-OPERATIVE DIAGNOSIS:  left foot infection  POST-OPERATIVE DIAGNOSIS:  left foot infection  PROCEDURE:  Procedure(s): IRRIGATION AND DEBRIDEMENT EXTREMITY placement of wound vac (Left)  SURGEON:  Surgeon(s) and Role:    Beverely Low, MD - Primary  PHYSICIAN ASSISTANT:   ASSISTANTS: none   ANESTHESIA:   general  EBL:  Minimal    BLOOD ADMINISTERED:none  DRAINS: Wound Vac to dorsal left foot   LOCAL MEDICATIONS USED:  NONE  SPECIMEN:  Left foot abscess, cultures to micro  DISPOSITION OF SPECIMEN:  micro  COUNTS:  YES  TOURNIQUET:  * Missing tourniquet times found for documented tourniquets in log: [id number] *  DICTATION: .Other Dictation: Dictation Number [id number]  PLAN OF CARE: Admit to inpatient   PATIENT DISPOSITION:  PACU - guarded condition.   Delay start of Pharmacological VTE agent (>24hrs) due to surgical blood loss or risk of bleeding: no

## 2018-12-11 NOTE — Anesthesia Procedure Notes (Signed)
Procedure Name: Intubation Date/Time: 12/11/2018 12:53 AM Performed by: Montel Clock, CRNA Pre-anesthesia Checklist: Patient identified, Emergency Drugs available, Suction available, Patient being monitored and Timeout performed Patient Re-evaluated:Patient Re-evaluated prior to induction Oxygen Delivery Method: Circle system utilized Preoxygenation: Pre-oxygenation with 100% oxygen Induction Type: IV induction, Rapid sequence and Cricoid Pressure applied Laryngoscope Size: Mac and 3 Grade View: Grade I Tube type: Oral Tube size: 7.5 mm Number of attempts: 1 Airway Equipment and Method: Stylet Placement Confirmation: ETT inserted through vocal cords under direct vision,  positive ETCO2 and breath sounds checked- equal and bilateral Secured at: 23 cm Tube secured with: Tape Dental Injury: Teeth and Oropharynx as per pre-operative assessment

## 2018-12-11 NOTE — Plan of Care (Signed)
  Problem: Pain Managment: Goal: General experience of comfort will improve Outcome: Progressing   Problem: Safety: Goal: Ability to remain free from injury will improve Outcome: Progressing   Problem: Education: Goal: Required Educational Video(s) Outcome: Progressing   Problem: Clinical Measurements: Goal: Postoperative complications will be avoided or minimized Outcome: Progressing   Problem: Skin Integrity: Goal: Demonstration of wound healing without infection will improve Outcome: Progressing   

## 2018-12-11 NOTE — Progress Notes (Signed)
PROGRESS NOTE    Derek Jennings  LGX:[id number] DOB: 08/18/1985 DOA: 12/10/2018 PCP: Elby Showers, MD   Brief Narrative:  HPI per Dr. Toy Baker on 12/10/2018 Derek Jennings is a 33 y.o. male with medical history significant of polysubstance abuse, IVDU   Presented with   Foot pain on  6 days ago Monday started to turned pink He urgent care next day because  it was turning redHe was started on prednisone has been on prednisone for the past 5 days.  3 days ago   he came to [place] time he felt to have cellulitis and was started on clindamycin unfortunately he continued to take prednisone   It has increased in the foot turned black and now burst open  no sick contacts Mom works from home have been socially distancing  Last drug use was weeks ago Has been clean for the past few weeks  **Interim History Patient was taken for surgical intervention by Dr. Mariea Stable early this a.m. and he is 0 for an irrigation debridement of his left foot along with a wound VAC placement.  Discussed with Dr. Stann Mainland of orthopedic surgery who recommends transferring to [facility] for further care and evaluation by Dr. Sharol Given for possible repeat surgical procedure and/or amputation possibly.  Assessment & Plan:   Active Problems:   Cellulitis and abscess of foot   Sepsis ([place])   Wound infection   Left foot infection   Hyperglycemia   Normocytic anemia  Cellulitis and Abscess of Left Foot s/p Irrigation and Debridement POD 0 by Dr. Netta Cedars -DG Left Foot showed "No acute bony abnormality is noted. Considerable soft tissue swelling is noted along the dorsum of the foot with mild soft tissue irregularity consistent with the given clinical history. No radiopaque foreign body is noted. No changes of osteomyelitis are seen." -Failed outpatient Abx with Clindamycin and Doxycycline -Appreciate Orthopedics consult plan to take to the OR tonight for washout.   -Initiated Broad-Spectrum Antibiotics  12/10/18 with IV Vancomycin, IV Ceftriaxone, and IV Flagyl and will continue today; Monitor Renal Fxn Closely as patient is on IV Vancomycin  -Await results of wound culture/blood culture to further assess. -Check for MRSA  -Pain management with IV Dilaudid 1 mg q4hprn given severe pain and large wound; Currently well controlled with a dose of Dilaudid -C/w Acetaminophen 650 mg po/RC q6hprn Mild Pain or Fever -Given Tdap injection 0.5 mg IM yesterday along with Pneumococcal 23 Valent Vaccine -C/w Zofran 4 mg po/IV q6hprn and Metaclopramide 5-10 mg po/IV q8hprn Nausea if Zofran is ineffective -Bowel Regimen with Docusate 100 mg po BID -C/w Supportive Care with NS at 75 mL/hr x1 Day -WBC went from 16.3 -> 14.2 -CRP was 16.8 and ESR was -LA went from 0.9 -> 0.6 -Procalcitonin was 0.11 -WOC Nurse Consulted for further evaluation; Has a Wound Vac Now -Will Consult Nutritionist for further evaluation -Continue to Monitor and Repeat CBC in AM   Sepsis 2/2 to Above, improving   -SIRS criteria met with Elevated WBC (16.3) and Tachypenia along with a source of infection ithout evidence of end organ damage  -Most likely source being wound -Obtained serial lactic acid (now 0.6) and procalcitonin level of 0.11.  -Initiated IV antibiotics as above   -Await results of Blood Culture; Blood Cx on 12/08/2018 showed NGTD at 3 Days -Repeat Blood Cx 12/10/2018 Pending -Gram Stain from Left Wound Showed Rare WBC present, and Predominantly PMN, Few Gram Negative Rods, and Rare Gram Positive Cocci  in Clusters with Cx Results pending  -Continue to Rehydrate with NS at 75 mL/hr x 1 Day -Continue to Monitor Temperature Curve and Blood Cx Results      History of Drug Abuse and Tobacco Abuse -Currently states trying to stay clean.   -Would benefit from social work consult -UDS was positive for Opiates; Last UDS in 2018 was + for Amphetamines and THC -Drug Abuse and Tobacco Abuse Counseling given   Normocytic Anemia  -Patient's hemoglobin/hematocrit went from 13.4/39.9 is now 12.2/37.2 -Check anemia panel in a.m. -Continue to monitor for signs and symptoms of bleeding -Repeat CBC in a.m.  Hyperglycemia -Likely Reactive  -Patient's blood sugars been ranging from 111-118 on CMP -Checked Hemoglobin A1c and was 5.5 -Continue monitor blood sugars carefully and if consistently elevated will need to place on sensitive NovoLog sliding scale insulin    DVT prophylaxis: Enoxaparin 40 mg sq q24h Code Status: FULL CODE Family Communication: No family present at bedside  Disposition Plan: Transfer to [facility] at the request of Orthopedic Surgery Dr. Stann Mainland  Consultants:   Orthopedic Surgery Dr. Stann Mainland   Procedures:  Brandon placement of wound vac (Left) per Dr. Netta Cedars on 12/11/2018   Antimicrobials:  Anti-infectives (From admission, onward)   Start     Dose/Rate Route Frequency Ordered Stop   12/11/18 0645  vancomycin (VANCOCIN) 1,250 mg in sodium chloride 0.9 % 250 mL IVPB     1,250 mg 166.7 mL/hr over 90 Minutes Intravenous Every 8 hours 12/11/18 0634     12/11/18 0115  metroNIDAZOLE (FLAGYL) IVPB 500 mg     500 mg 100 mL/hr over 60 Minutes Intravenous Every 8 hours 12/11/18 0107     12/11/18 0110  metroNIDAZOLE (FLAGYL) 5-0.79 MG/ML-% IVPB    Note to Pharmacy:  Ward, Christa   : cabinet override      12/11/18 0110 12/11/18 0115   12/11/18 0109  sodium chloride 0.9 % with cefTRIAXone (ROCEPHIN) ADS Med    Note to Pharmacy:  Ward, Christa   : cabinet override      12/11/18 0109 12/11/18 0115   12/11/18 0107  cefTRIAXone (ROCEPHIN) 2 g in sodium chloride 0.9 % 100 mL IVPB     2 g 200 mL/hr over 30 Minutes Intravenous Every 24 hours 12/11/18 0107     12/10/18 2215  vancomycin (VANCOCIN) 1,500 mg in sodium chloride 0.9 % 500 mL IVPB     1,500 mg 250 mL/hr over 120 Minutes Intravenous  Once 12/10/18 2202 12/11/18 0100     Subjective: Seen and examined at  bedside and was complaining of severe left foot pain.  No nausea or vomiting.  No lightheadedness or dizziness.  Discussed with him about transferring to [facility] for further evaluation he is agreeable.  No other concerns or complaints at this time  Objective: Vitals:   12/11/18 0215 12/11/18 0240 12/11/18 0424 12/11/18 1039  BP: 134/81 139/90 123/75 110/66  Pulse:  61 70 80  Resp:  _0 Temp: 98.5 F (36.9 C) 98 F (36.7 C) 98 F (36.7 C) 98 F (36.7 C)  TempSrc:  Oral Oral Oral  SpO2:  100% 100% 100%  Weight:  83.9 kg    Height:  6' (1.829 m)      Intake/Output Summary (Last 24 hours) at 12/11/2018 1149 Last data filed at 12/11/2018 0709 Gross per 24 hour  Intake 2645.42 ml  Output 200 ml  Net 2445.42 ml   [employer]  12/10/18 2013 12/11/18 0240  Weight: 83.9 kg 83.9 kg   Examination: Physical Exam:  Constitutional: WN/WD Male in NAD and appears calm and but somewhat uncomfortable Eyes: Lids and conjunctivae normal, sclerae anicteric  ENMT: External Ears, Nose appear normal. Grossly normal hearing. Mucous membranes are moist. Neck: Appears normal, supple, no cervical masses, normal ROM, no appreciable thyromegaly; no JVD Respiratory: Diminished to auscultation bilaterally slightly, no wheezing, rales, rhonchi or crackles. Normal respiratory effort and patient is not tachypenic. No accessory muscle use.  Cardiovascular: RRR, no murmurs / rubs / gallops. S1 and S2 auscultated. Has Edema in the LLE Abdomen: Soft, non-tender, Non-distended. No masses palpated. No appreciable hepatosplenomegaly. Bowel sounds positive x4.  GU: Deferred. Musculoskeletal: No clubbing / cyanosis of digits/nails. No joint deformity upper and lower extremities. .  Skin: Left foot is very erythematous and swollen along with warmth.  Has a wound VAC in place Neurologic: CN 2-12 grossly intact with no focal deficits.  Romberg sign and cerebellar reflexes not assessed.  Psychiatric: Normal  judgment and insight. Alert and oriented x 3. Normal mood and appropriate affect.   Data Reviewed: I have personally reviewed following labs and imaging studies  CBC: Recent Labs  Lab 12/10/18 2046 12/11/18 0422  WBC 16.3* 14.2*  NEUTROABS 12.0*  --   HGB 13.4 12.2*  HCT 39.9 37.2*  MCV 92.4 93.7  PLT 380 785   Basic Metabolic Panel: Recent Labs  Lab 12/10/18 2046 12/11/18 0422  NA 136 138  K 3.7 3.8  CL 103 104  CO2 24 25  GLUCOSE 111* 118*  BUN 19 13  CREATININE 0.81 0.68  CALCIUM 8.3* 7.9*  MG  --  2.0  PHOS  --  3.3   GFR: Estimated Creatinine Clearance: 148.2 mL/min (by C-G formula based on SCr of 0.68 mg/dL). Liver Function Tests: Recent Labs  Lab 12/10/18 2046 12/11/18 0422  AST 17 14*  ALT 32 25  ALKPHOS 81 77  BILITOT 0.6 0.5  PROT 6.8 6.1*  ALBUMIN 3.0* 2.6*   No results for input(s): LIPASE, AMYLASE in the last 168 hours. No results for input(s): AMMONIA in the last 168 hours. Coagulation Profile: Recent Labs  Lab 12/11/18 0422  INR 1.2   Cardiac Enzymes: No results for input(s): CKTOTAL, CKMB, CKMBINDEX, TROPONINI in the last 168 hours. BNP (last 3 results) No results for input(s): PROBNP in the last 8760 hours. HbA1C: Recent Labs    12/11/18 0422  HGBA1C 5.5   CBG: No results for input(s): GLUCAP in the last 168 hours. Lipid Profile: No results for input(s): CHOL, HDL, LDLCALC, TRIG, CHOLHDL, LDLDIRECT in the last 72 hours. Thyroid Function Tests: Recent Labs    12/11/18 0422  TSH 1.749   Anemia Panel: No results for input(s): VITAMINB12, FOLATE, FERRITIN, TIBC, IRON, RETICCTPCT in the last 72 hours. Sepsis Labs: Recent Labs  Lab 12/10/18 2046 12/10/18 2246 12/11/18 0422  PROCALCITON  --   --  0.11  LATICACIDVEN 0.9 0.6  --     Recent Results (from the past 240 hour(s))  Blood culture (routine x 2)     Status: None (Preliminary result)   Collection Time: 12/08/18  5:31 PM  Result Value Ref Range Status   Specimen  Description   Final    BLOOD RIGHT FOREARM Performed at [facility], [place] [address], [place], [place] [address]    Special Requests   Final    BOTTLES DRAWN AEROBIC AND ANAEROBIC Blood Culture results may not  be optimal due to an inadequate volume of blood received in culture bottles Performed at [place] [address], [place], [place] [address]    Culture   Final    NO GROWTH 3 DAYS Performed at [facility], [place] [address], [place], [place] [address]    Report Status PENDING  Incomplete  Blood culture (routine x 2)     Status: None (Preliminary result)   Collection Time: 12/08/18  5:38 PM  Result Value Ref Range Status   Specimen Description   Final    BLOOD LEFT ANTECUBITAL Performed at [place] [address] [place], [place] [address]    Special Requests   Final    BOTTLES DRAWN AEROBIC AND ANAEROBIC Blood Culture adequate volume Performed at [place] [address], [place], [place] [address]    Culture   Final    NO GROWTH 3 DAYS Performed at [facility], [place] [address], [place], [place] [address]    Report Status PENDING  Incomplete  SARS Coronavirus 2 [facility] order, Performed in [place] hospital lab)     Status: None   Collection Time: 12/10/18 11:00 PM  Result Value Ref Range Status   SARS Coronavirus 2 NEGATIVE NEGATIVE Final    Comment: (NOTE) If result is NEGATIVE SARS-CoV-2 target nucleic acids are NOT DETECTED. The SARS-CoV-2 RNA is generally detectable in upper and lower  respiratory specimens during the acute phase of infection. The lowest  concentration of SARS-CoV-2 viral copies this assay can detect is 250  copies / mL. A negative result does not preclude SARS-CoV-2 infection  and should not be used as the sole basis for treatment or other  patient management decisions.  A negative result may occur with  improper specimen collection / handling, submission  of specimen other  than nasopharyngeal swab, presence of viral mutation(s) within the  areas targeted by this assay, and inadequate number of viral copies  (<250 copies / mL). A negative result must be combined with clinical  observations, patient history, and epidemiological information. If result is POSITIVE SARS-CoV-2 target nucleic acids are DETECTED. The SARS-CoV-2 RNA is generally detectable in upper and lower  respiratory specimens dur ing the acute phase of infection.  Positive  results are indicative of active infection with SARS-CoV-2.  Clinical  correlation with patient history and other diagnostic information is  necessary to determine patient infection status.  Positive results do  not rule out bacterial infection or co-infection with other viruses. If result is PRESUMPTIVE POSTIVE SARS-CoV-2 nucleic acids MAY BE PRESENT.   A presumptive positive result was obtained on the submitted specimen  and confirmed on repeat testing.  While 2019 novel coronavirus  (SARS-CoV-2) nucleic acids may be present in the submitted sample  additional confirmatory testing may be necessary for epidemiological  and / or clinical management purposes  to differentiate between  SARS-CoV-2 and other Sarbecovirus currently known to infect humans.  If clinically indicated additional testing with an alternate test  methodology [phone]) is advised. The SARS-CoV-2 RNA is generally  detectable in upper and lower respiratory sp ecimens during the acute  phase of infection. The expected result is Negative. Fact Sheet for Patients:  StrictlyIdeas.no Fact Sheet for Healthcare Providers: BankingDealers.co.za This test is not yet approved or cleared by the [place] FDA and has been authorized for detection and/or diagnosis of SARS-CoV-2 by FDA under an Emergency Use Authorization (EUA).  This EUA will remain  in effect (meaning this test can be used) for  the duration of the COVID-19 declaration under Section 564(b)(1) of the Act, 21 U.S.C. section 360bbb-3(b)(1), unless the authorization is terminated or revoked sooner. Performed at [facility], [place] [address] [place], [place] [address]   Aerobic/Anaerobic Culture (surgical/deep wound)     Status: None (Preliminary result)   Collection Time: 12/11/18  1:08 AM  Result Value Ref Range Status   Specimen Description   Final    WOUND LEFT FOOT Performed at [facility], [place] [address] [place], [place] [address]    Special Requests   Final    NONE Performed at [facility], [place] [address], [place], [place] [address]    Gram Stain   Final    RARE WBC PRESENT, PREDOMINANTLY PMN FEW GRAM NEGATIVE RODS RARE GRAM POSITIVE COCCI IN CLUSTERS Performed at [facility], [place] [address], [place], [place] [address]    Culture PENDING  Incomplete   Report Status PENDING  Incomplete    Radiology Studies: Dg Foot Complete Left  Result Date: 12/10/2018 CLINICAL DATA:  Increased soft tissue swelling with possible underlying infection. EXAM: LEFT FOOT - COMPLETE 3+ VIEW COMPARISON:  12/08/2018 FINDINGS: No acute bony abnormality is noted. Considerable soft tissue swelling is noted along the dorsum of the foot with mild soft tissue irregularity consistent with the given clinical history. No radiopaque foreign body is noted. No changes of osteomyelitis are seen. IMPRESSION: Soft tissue infection along the dorsum of the foot. No acute bony abnormality is noted. Electronically Signed   By: Inez Catalina M.D.   On: 12/10/2018 21:53   Scheduled Meds: . docusate sodium  100 mg Oral BID  . [START ON 12/12/2018] enoxaparin (LOVENOX) injection  40 mg Subcutaneous Q24H  . HYDROmorphone      . [START ON 12/12/2018] pneumococcal 23 valent vaccine  0.5 mL Intramuscular Tomorrow-1000   Continuous Infusions: . sodium chloride    . acetaminophen    . cefTRIAXone  (ROCEPHIN)  IV    . metronidazole 500 mg (12/11/18 0557)  . vancomycin 1,250 mg (12/11/18 0709)    LOS: 1 day   Kerney Elbe, DO [facility] PAGER is on [place]  If 7PM-7AM, please contact night-coverage [email or web address] Password [email or web address] 12/11/2018, 11:49 AM

## 2018-12-11 NOTE — Consult Note (Signed)
[facility] Nurse wound consult note Reason for Consult: Surgical wound to left foot with NPWT (VAC) placed in OR on Sunday, 12/11/18). Orthopedics and [place] were simultaneously consulted. Wound type: Infectious, surgical Pressure Injury POA: N/A Dr. Veverly Fells took patient to OR for debridement and washout of infected left foot wound.  See photo taken by Dr. Roel Cluck in her note dated 12/10/18 at 10:11pm. NPWT device placed intraoperatively; dressing changes will be Tuesday/Thursday and Saturday this week. Orders placed for Nursing for dressing change on Tuesday and VAC settings in place.   Note: It will be necessary for an Ortho MD or his designee (PA) to be present for dressing change on Tuesday to assess wound as it is the first surgical dressing change. Nursing instructed today to obtain Medium VAC dressing sponge/dressing kit to the room for Tuesday's change. Bedside RN is requested to obtain wound measurements prior to redressing wound.  If [facility] is needed for continued care and NPWT dressings at home following discharge, please order/arrange via Care Management.   [place] nursing team will not follow, but will remain available to this patient, the nursing and medical teams.  Please re-consult if needed. Thanks, Maudie Flakes, MSN, RN, [place], Arther Abbott  Pager# [phone]

## 2018-12-11 NOTE — Progress Notes (Signed)
Pharmacy Antibiotic Note  Derek Jennings is a 33 y.o. male admitted on 12/10/2018 with cellulitis.  Pharmacy has been consulted for Vancomycin dosing.  Plan: Vancomycin 1500mg  iv x1, then Vancomycin 1250 mg IV Q 8 hrs. Goal AUC 400-550. Expected AUC: 487 SCr used: 0.8 (adjusted)   Height: 6' (182.9 cm) Weight: 185 lb (83.9 kg) IBW/kg (Calculated) : 77.6  Temp (24hrs), Avg:98.1 F (36.7 C), Min:97.6 F (36.4 C), Max:98.7 F (37.1 C)  Recent Labs  Lab 12/10/18 2046 12/10/18 2246 12/11/18 0422  WBC 16.3*  --  14.2*  CREATININE 0.81  --  0.68  LATICACIDVEN 0.9 0.6  --     Estimated Creatinine Clearance: 148.2 mL/min (by C-G formula based on SCr of 0.68 mg/dL).    Allergies  Allergen Reactions  . Amoxicillin Hives  . Penicillins Hives and Rash    Did it involve swelling of the face/tongue/throat, SOB, or low BP? Unknown Did it involve sudden or severe rash/hives, skin peeling, or any reaction on the inside of your mouth or nose? Unknown Did you need to seek medical attention at a hospital or doctor's office? Unknown When did it last happen? If all above answers are "NO", may proceed with cephalosporin use.     Antimicrobials this admission: Vancomycin 12/10/2018 >>  Dose adjustments this admission: -  Microbiology results: -  Thank you for allowing pharmacy to be a part of this patient's care.  Aleene Davidson Crowford 12/11/2018 6:34 AM

## 2018-12-11 NOTE — Anesthesia Postprocedure Evaluation (Signed)
Anesthesia Post Note  Patient: Mudlogger) Performed: IRRIGATION AND DEBRIDEMENT EXTREMITY placement of wound vac (Left Foot)     Patient location during evaluation: PACU Anesthesia Type: General Level of consciousness: awake and alert Pain management: pain level controlled Vital Signs Assessment: post-procedure vital signs reviewed and stable Respiratory status: spontaneous breathing, nonlabored ventilation, respiratory function stable and patient connected to nasal cannula oxygen Cardiovascular status: blood pressure returned to baseline and stable Postop Assessment: no apparent nausea or vomiting Anesthetic complications: no    Last Vitals:  Vitals:   12/11/18 0141 12/11/18 0145  BP:  (!) 140/98  Pulse:  84  Resp:  19  Temp: 36.4 C 36.4 C  SpO2:  100%    Last Pain:  Vitals:   12/11/18 0207  TempSrc:   PainSc: Asleep                 Trevor Iha

## 2018-12-11 NOTE — Plan of Care (Signed)
°  Problem: Education: °Goal: Knowledge of General Education information will improve °Description: Including pain rating scale, medication(s)/side effects and non-pharmacologic comfort measures °Outcome: Progressing °  °Problem: Health Behavior/Discharge Planning: °Goal: Ability to manage health-related needs will improve °Outcome: Progressing °  °Problem: Clinical Measurements: °Goal: Ability to maintain clinical measurements within normal limits will improve °Outcome: Progressing °Goal: Will remain free from infection °Outcome: Progressing °Goal: Diagnostic test results will improve °Outcome: Progressing °Goal: Respiratory complications will improve °Outcome: Progressing °Goal: Cardiovascular complication will be avoided °Outcome: Progressing °  °Problem: Activity: °Goal: Risk for activity intolerance will decrease °Outcome: Progressing °  °Problem: Nutrition: °Goal: Adequate nutrition will be maintained °Outcome: Progressing °  °Problem: Coping: °Goal: Level of anxiety will decrease °Outcome: Progressing °  °Problem: Elimination: °Goal: Will not experience complications related to bowel motility °Outcome: Progressing °Goal: Will not experience complications related to urinary retention °Outcome: Progressing °  °Problem: Pain Managment: °Goal: General experience of comfort will improve °Outcome: Progressing °  °Problem: Safety: °Goal: Ability to remain free from injury will improve °Outcome: Progressing °  °Problem: Skin Integrity: °Goal: Risk for impaired skin integrity will decrease °Outcome: Progressing °  °Problem: Clinical Measurements: °Goal: Ability to avoid or minimize complications of infection will improve °Outcome: Progressing °  °Problem: Skin Integrity: °Goal: Skin integrity will improve °Outcome: Progressing °  °Problem: Education: °Goal: Required Educational Video(s) °Outcome: Progressing °  °Problem: Clinical Measurements: °Goal: Postoperative complications will be avoided or minimized °Outcome:  Progressing °  °Problem: Skin Integrity: °Goal: Demonstration of wound healing without infection will improve °Outcome: Progressing °  °

## 2018-12-11 NOTE — Transfer of Care (Signed)
Immediate Anesthesia Transfer of Care Note  Patient: Derek Jennings  Procedure(s) Performed: IRRIGATION AND DEBRIDEMENT EXTREMITY placement of wound vac (Left Foot)  Patient Location: PACU  Anesthesia Type:General  Level of Consciousness: awake and patient cooperative  Airway & Oxygen Therapy: Patient Spontanous Breathing and Patient connected to face mask oxygen  Post-op Assessment: Report given to RN and Post -op Vital signs reviewed and stable  Post vital signs: Reviewed and stable  Last Vitals:  Vitals Value Taken Time  BP 134/98 12/11/2018  1:41 AM  Temp    Pulse 84 12/11/2018  1:44 AM  Resp 19 12/11/2018  1:44 AM  SpO2 100 % 12/11/2018  1:44 AM  Vitals shown include unvalidated device data.  Last Pain:  Vitals:   12/11/18 0000  TempSrc:   PainSc: 9          Complications: No apparent anesthesia complications

## 2018-12-12 ENCOUNTER — Ambulatory Visit (INDEPENDENT_AMBULATORY_CARE_PROVIDER_SITE_OTHER): Payer: Self-pay | Admitting: Physician Assistant

## 2018-12-12 ENCOUNTER — Inpatient Hospital Stay (HOSPITAL_COMMUNITY): Payer: Self-pay

## 2018-12-12 DIAGNOSIS — E440 Moderate protein-calorie malnutrition: Secondary | ICD-10-CM

## 2018-12-12 DIAGNOSIS — L02619 Cutaneous abscess of unspecified foot: Secondary | ICD-10-CM

## 2018-12-12 DIAGNOSIS — L03119 Cellulitis of unspecified part of limb: Secondary | ICD-10-CM

## 2018-12-12 DIAGNOSIS — F1910 Other psychoactive substance abuse, uncomplicated: Secondary | ICD-10-CM | POA: Diagnosis present

## 2018-12-12 DIAGNOSIS — L089 Local infection of the skin and subcutaneous tissue, unspecified: Secondary | ICD-10-CM

## 2018-12-12 DIAGNOSIS — L03116 Cellulitis of left lower limb: Secondary | ICD-10-CM

## 2018-12-12 LAB — BASIC METABOLIC PANEL
Anion gap: 12 (ref 5–15)
BUN: 7 mg/dL (ref 6–20)
CO2: 28 mmol/L (ref 22–32)
Calcium: 8.6 mg/dL — ABNORMAL LOW (ref 8.9–10.3)
Chloride: 98 mmol/L (ref 98–111)
Creatinine, Ser: 0.86 mg/dL (ref 0.61–1.24)
GFR calc Af Amer: >60 mL/min (ref >60)
GFR calc non Af Amer: >60 mL/min (ref >60)
Glucose, Bld: 96 mg/dL (ref 70–99)
Potassium: 4.3 mmol/L (ref 3.5–5.1)
Sodium: 138 mmol/L (ref 135–145)

## 2018-12-12 LAB — CBC
HCT: 35.4 % — ABNORMAL LOW (ref 39.0–52.0)
Hemoglobin: 11.7 g/dL — ABNORMAL LOW (ref 13.0–17.0)
MCH: 30.1 pg (ref 26.0–34.0)
MCHC: 33.1 g/dL (ref 30.0–36.0)
MCV: 91 fL (ref 80.0–100.0)
Platelets: 418 10*3/uL — ABNORMAL HIGH (ref 150–400)
RBC: 3.89 MIL/uL — ABNORMAL LOW (ref 4.22–5.81)
RDW: 13 % (ref 11.5–15.5)
WBC: 9.6 10*3/uL (ref 4.0–10.5)
nRBC: 0 % (ref 0.0–0.2)

## 2018-12-12 MED ORDER — ADULT MULTIVITAMIN W/MINERALS CH
1.0000 | ORAL_TABLET | Freq: Every day | ORAL | Status: DC
  Administered 2018-12-12 – 2018-12-15 (×4): 1 via ORAL
  Filled 2018-12-12 (×3): qty 1

## 2018-12-12 MED ORDER — CLINDAMYCIN PHOSPHATE 900 MG/50ML IV SOLN
900.0000 mg | INTRAVENOUS | Status: AC
  Administered 2018-12-13: 900 mg via INTRAVENOUS
  Filled 2018-12-12 (×2): qty 50

## 2018-12-12 MED ORDER — ENSURE ENLIVE PO LIQD
237.0000 mL | Freq: Two times a day (BID) | ORAL | Status: DC
  Administered 2018-12-12 – 2018-12-15 (×4): 237 mL via ORAL

## 2018-12-12 MED ORDER — CHLORHEXIDINE GLUCONATE 4 % EX LIQD: 60.0000 mL | Freq: Once | CUTANEOUS | Status: DC

## 2018-12-12 MED ORDER — ENSURE PRE-SURGERY PO LIQD
296.0000 mL | Freq: Once | ORAL | Status: AC
  Administered 2018-12-12: 22:00:00 296 mL via ORAL
  Filled 2018-12-12: qty 296

## 2018-12-12 NOTE — H&P (View-Only) (Signed)
ORTHOPAEDIC CONSULTATION  REQUESTING PHYSICIAN: Cathren Harshai, Ripudeep K, MD  Chief Complaint: Ulcer dorsum left foot.  HPI: Derek Jennings is a 33 y.o. male who presents with ulcer dorsum of the left foot.  Patient states he feels like this may have come from a bug bite.  Patient states he drinks a lot of sodas but does not have a history of diabetes.  Past Medical History:  Diagnosis Date  . ADD (attention deficit disorder)   . Allergy   . Hyperhidrosis    Past Surgical History:  Procedure Laterality Date  . ADENOIDECTOMY    . I&D EXTREMITY Left 12/10/2018   Procedure: IRRIGATION AND DEBRIDEMENT EXTREMITY placement of wound vac;  Surgeon: Beverely LowNorris, Steve, MD;  Location: [facility];  Service: Orthopedics;  Laterality: Left;   Social History   Socioeconomic History  . Marital status: Single    Spouse name: Not on file  . Number of children: Not on file  . Years of education: Not on file  . Highest education level: Not on file  Occupational History  . Not on file  Social Needs  . Financial resource strain: Not on file  . Food insecurity:    Worry: Not on file    Inability: Not on file  . Transportation needs:    Medical: Not on file    Non-medical: Not on file  Tobacco Use  . Smoking status: Current Every Day Smoker    Packs/day: 0.20  . Smokeless tobacco: Former Engineer, [employer]  Substance and Sexual Activity  . Alcohol use: Yes    Comment: socially  . Drug use: No  . Sexual activity: Not on file  Lifestyle  . Physical activity:    Days per week: Not on file    Minutes per session: Not on file  . Stress: Not on file  Relationships  . Social connections:    Talks on phone: Not on file    Gets together: Not on file    Attends religious service: Not on file    Active member of club or organization: Not on file    Attends meetings of clubs or organizations: Not on file    Relationship status: Not on file  Other Topics Concern  . Not on file  Social History Narrative  . Not on file    Family History  Problem Relation Age of Onset  . Hypertension Mother   . Heart disease Father   . Stroke Father   . Diabetes Other    - negative except otherwise stated in the family history section Allergies  Allergen Reactions  . Amoxicillin Hives  . Penicillins Hives and Rash    Did it involve swelling of the face/tongue/throat, SOB, or low BP? Unknown Did it involve sudden or severe rash/hives, skin peeling, or any reaction on the inside of your mouth or nose? Unknown Did you need to seek medical attention at a hospital or doctor's office? Unknown When did it last happen? If all above answers are "NO", may proceed with cephalosporin use.    Prior to Admission medications   Medication Sig Start Date End Date Taking? Authorizing Provider  clindamycin (CLEOCIN) 300 MG capsule Take 1 capsule (300 mg total) by mouth 3 (three) times daily for 5 days. 12/08/18 12/13/18 Yes Henderly, Britni A, PA-C  diphenhydrAMINE (BENADRYL) 25 MG tablet Take 25 mg by mouth 3 (three) times daily.   Yes [provider]  famotidine (PEPCID) 10 MG tablet Take 10 mg by mouth 3 (three)  times daily.   Yes [provider]  glycopyrrolate (ROBINUL) 2 MG tablet Take 2 mg by mouth daily.     Yes [provider]  ibuprofen (ADVIL) 200 MG tablet Take 800-1,000 mg by mouth every 6 (six) hours as needed for moderate pain.   Yes [provider]  loratadine (CLARITIN) 10 MG tablet Take 10 mg by mouth daily.   Yes [provider]  Melatonin 5 MG TABS Take 5 mg by mouth at bedtime as needed (sleep).   Yes [provider]  predniSONE (STERAPRED UNI-PAK 21 TAB) 10 MG (21) TBPK tablet Take 10 mg by mouth See admin instructions. Listed on package 12/06/18  Yes [provider]  vitamin C (ASCORBIC ACID) 500 MG tablet Take 1,500 mg by mouth daily.   Yes [provider]  doxycycline (VIBRA-TABS) 100 MG tablet Take 1 tablet (100 mg total) by mouth 2 (two)  times daily. Patient not taking: Reported on 11/22/2018 07/03/15   Margaree Mackintosh, MD  promethazine (PHENERGAN) 25 MG tablet Take 1 tablet (25 mg total) by mouth every 6 (six) hours as needed for nausea or vomiting. Patient not taking: Reported on 11/22/2018 06/20/15   Margaree Mackintosh, MD   Dg Foot Complete Left  Result Date: 12/10/2018 CLINICAL DATA:  Increased soft tissue swelling with possible underlying infection. EXAM: LEFT FOOT - COMPLETE 3+ VIEW COMPARISON:  12/08/2018 FINDINGS: No acute bony abnormality is noted. Considerable soft tissue swelling is noted along the dorsum of the foot with mild soft tissue irregularity consistent with the given clinical history. No radiopaque foreign body is noted. No changes of osteomyelitis are seen. IMPRESSION: Soft tissue infection along the dorsum of the foot. No acute bony abnormality is noted. Electronically Signed   By: Alcide Clever M.D.   On: 12/10/2018 21:53   - pertinent xrays, CT, MRI studies were reviewed and independently interpreted  Positive ROS: All other systems have been reviewed and were otherwise negative with the exception of those mentioned in the HPI and as above.  Physical Exam: General: Alert, no acute distress Psychiatric: Patient is competent for consent with normal mood and affect Lymphatic: No axillary or cervical lymphadenopathy Cardiovascular: No pedal edema Respiratory: No cyanosis, no use of accessory musculature GI: No organomegaly, abdomen is soft and non-tender    Images:  @ENCIMAGES @  Labs:  Lab Results  Component Value Date   HGBA1C 5.5 12/11/2018   ESRSEDRATE 41 (H) 12/11/2018   CRP 16.8 (H) 12/11/2018   REPTSTATUS PENDING 12/11/2018   GRAMSTAIN  12/11/2018    RARE WBC PRESENT, PREDOMINANTLY PMN FEW GRAM NEGATIVE RODS RARE GRAM POSITIVE COCCI IN CLUSTERS Performed at [facility], [address] [place], [place] [address]    CULT PENDING 12/11/2018    Lab Results  Component Value Date    ALBUMIN 2.6 (L) 12/11/2018   ALBUMIN 3.0 (L) 12/10/2018   ALBUMIN 4.6 10/10/2016   PREALBUMIN 12.3 (L) 12/11/2018    Neurologic: Patient does not have protective sensation bilateral lower extremities.   MUSCULOSKELETAL:   Skin: Examination the wound VAC is in place on the left foot.  There is about 325 cc in the wound VAC canister.  There is no ascending cellulitis.  No ulcers on the plantar aspect of the left foot.  Patient does have a slightly elevated hemoglobin A1c at 5.5 most likely from his diet.  He does have protein caloric malnutrition with an albumin of 2.6.  Review of the radiographs shows  no destructive bony changes.  Patient is status post irrigation and debridement of the abscess left foot.  Patient's PCR screen was positive for MRSA.  Wound cultures show gram-positive cocci and gram-negative rods. SARS  coronavirus 2 nasal swab is negative.  Assessment: Assessment: Moderate protein caloric malnutrition with abscess and ulceration left foot postoperative day 1.  Plan: Plan: We will plan for repeat irrigation and debridement on Tuesday.  Patient may require split-thickness skin graft.  May require long-term antibiotics depending on the tissue viability at repeat surgery on Tuesday.  Thank you for the consult and the opportunity to see Mr. Hendrik Donath, MD [facility] [phone] 7:13 AM

## 2018-12-12 NOTE — Op Note (Signed)
NAMEAMADOR, Derek Jennings MEDICAL RECORD OF:[id number] ACCOUNT [id number] DATE OF BIRTH:03/24/1986 FACILITY: [facility] LOCATION: [facility] PHYSICIAN:STEVEN Russ Halo, MD  OPERATIVE REPORT  DATE OF PROCEDURE:  12/11/2018  PREOPERATIVE DIAGNOSIS:  Left foot infection.  POSTOPERATIVE DIAGNOSIS:  Left foot infection.  PROCEDURE PERFORMED:  Irrigation and debridement of left foot infection including muscle and soft tissue with placement of wound VAC.  ATTENDING SURGEON:  Malon Kindle, MD  ASSISTANT:  None.  ANESTHESIA:  General anesthesia was used.  ESTIMATED BLOOD LOSS:  Minimal.  FLUID REPLACEMENT:  1000 mL crystalloid.  INSTRUMENT COUNTS:  Correct.  COMPLICATIONS:  None.  ANTIBIOTICS:  Perioperative antibiotics were given.  Deep cultures were obtained and sent for microbiology for routine cultures, Gram stain, aerobic and anaerobic cultures.  INDICATIONS:  The patient is a 33 year old male with a history of IV drug abuse who presents with a dorsal foot wound.  The patient states that this wound has been there for greater than a week.  He was initially seen by urgent care and placed on oral  steroids for a possible allergic reaction after the fluid worsened.  He was then placed on oral antibiotics and seen several days ago.  Unfortunately, this did not improve and the patient came back to the ER and obviously had severe foot swelling,  erythema and active drainage and necrosis on the dorsal aspect of his foot.  Upon physical examination, the patient had a grossly infected dorsal left foot with a large abscess and a large area of necrosis in the dorsal foot.  This measured approximately  5 x 7 cm.  The patient's foot was expanding in size rapidly, even from the time he was seen in the emergency room and posted for surgery and underwent COVID testing.  By the time he got to the operating room, his foot had increased in size significantly  worrisome for impending compartment syndrome.  The patient  was emergently scheduled for surgery for I and D and placement of a wound VAC.  The patient was placed on broad-spectrum antibiotics by Internal Medicine, who is admitting the patient.  The  risks and benefits of surgery were discussed with the patient.  Informed consent obtained.  DESCRIPTION OF PROCEDURE:  After an adequate level of anesthesia was achieved, the patient was positioned in the supine position, left leg correctly identified, sterile prep and drape performed.  The left foot was clearly infected with a large amount of  draining foul-smelling purulent material.  We made a longitudinal incision extending from the necrotic area in the dorsal aspect of the midfoot and extending proximally and distally about 5-7 cm either side.  I then used blunt dissection with a hemostat  down into a grossly purulent area in the midfoot.  We obtained our deep cultures.  We then began a methodical sharp debridement using scissors and pickups.  We removed all nonviable material.  We then used pulse irrigation for 3 L of normal saline  irrigation using our finger to probe proximally, laterally, and medially to find any other areas where pockets of pus could be located.  This appeared to be expanding along fascial tissue planes.  Once we irrigated thoroughly, debrided, we then placed a  wound VAC and made sure that suction was working properly.  Once we verified that we had a functioning wound VAC, we concluded surgery.  The patient will be likely I and D'd again within 48 hours.  I will consult with Dr. ____ tomorrow for subspecialty  care and hopefully  limb salvage.  The patient understands the dire circumstances and that his foot is at risk with this infection.  LN/NUANCE  D:12/11/2018 T:12/11/2018 JOB:[id number]

## 2018-12-12 NOTE — Progress Notes (Signed)
ABI       has been completed. Preliminary results can be found under CV proc through chart review. Derek Jennings, BS, RDMS, RVT   

## 2018-12-12 NOTE — Plan of Care (Signed)
°  Problem: Education: °Goal: Knowledge of General Education information will improve °Description: Including pain rating scale, medication(s)/side effects and non-pharmacologic comfort measures °Outcome: Progressing °  °Problem: Health Behavior/Discharge Planning: °Goal: Ability to manage health-related needs will improve °Outcome: Progressing °  °Problem: Clinical Measurements: °Goal: Ability to maintain clinical measurements within normal limits will improve °Outcome: Progressing °Goal: Will remain free from infection °Outcome: Progressing °Goal: Diagnostic test results will improve °Outcome: Progressing °Goal: Respiratory complications will improve °Outcome: Progressing °Goal: Cardiovascular complication will be avoided °Outcome: Progressing °  °Problem: Activity: °Goal: Risk for activity intolerance will decrease °Outcome: Progressing °  °Problem: Nutrition: °Goal: Adequate nutrition will be maintained °Outcome: Progressing °  °Problem: Coping: °Goal: Level of anxiety will decrease °Outcome: Progressing °  °Problem: Elimination: °Goal: Will not experience complications related to bowel motility °Outcome: Progressing °Goal: Will not experience complications related to urinary retention °Outcome: Progressing °  °Problem: Pain Managment: °Goal: General experience of comfort will improve °Outcome: Progressing °  °Problem: Safety: °Goal: Ability to remain free from injury will improve °Outcome: Progressing °  °Problem: Skin Integrity: °Goal: Risk for impaired skin integrity will decrease °Outcome: Progressing °  °Problem: Clinical Measurements: °Goal: Ability to avoid or minimize complications of infection will improve °Outcome: Progressing °  °Problem: Skin Integrity: °Goal: Skin integrity will improve °Outcome: Progressing °  °Problem: Education: °Goal: Required Educational Video(s) °Outcome: Progressing °  °Problem: Clinical Measurements: °Goal: Postoperative complications will be avoided or minimized °Outcome:  Progressing °  °Problem: Skin Integrity: °Goal: Demonstration of wound healing without infection will improve °Outcome: Progressing °  °

## 2018-12-12 NOTE — TOC Initial Note (Signed)
Transition of Care [facility](TOC) - Initial/Assessment Note    Patient Details  Name: Derek Jennings MRN: [id number] Date of Birth: [birth year]  Transition of Care [facility](TOC) CM/SW Contact:    Kingsley PlanWile, Maddix Kliewer Marie, RN Phone Number: 12/12/2018, 1:17 PM  Clinical Narrative:                  Confirmed face sheet information. Patient is from home with his mother and can return at discharge.   Patient has not seen Dr Lenord FellersBaxley recently due to no insurance. He has applied for medicaid. [facility].  Patient understands if he needs long term IV ABX he will have to stay in hospital due to IVDU history. If patient can be discharged home on PO ABX [facility]   Wait closer to discharge to [facility]. Expected Discharge Plan: Home/Self Care Barriers to Discharge: Continued Medical Work up   Patient Goals and CMS Choice Patient states their goals for this hospitalization and ongoing recovery are:: to go home  CMS Medicare.gov Compare Post Acute Care list provided to:: Patient Choice offered to / list presented to : NA  Expected Discharge Plan and Services Expected Discharge Plan: Home/Self Care In-house Referral: Financial Counselor Discharge Planning Services: CM Consult, [facility]   Living arrangements for the past 2 months: Single Family Home                 DME Arranged: N/A DME Agency: NA       HH Arranged: NA HH Agency: NA        Prior Living Arrangements/Services Living arrangements for the past 2 months: Single Family Home Lives with:: Parents(lives with his mother ) Patient language and need for interpreter reviewed:: Yes Do you feel safe going back to the place where you live?: Yes      Need for Family Participation in Patient Care: Yes (Comment) Care giver support system in place?: Yes (comment)   Criminal Activity/Legal  Involvement Pertinent to Current Situation/Hospitalization: No - Comment as needed  Activities of Daily Living Home Assistive Devices/Equipment: None ADL Screening (condition at time of admission) Patient's cognitive ability adequate to safely complete daily activities?: Yes Is the patient deaf or have difficulty hearing?: No Does the patient have difficulty seeing, even when wearing glasses/contacts?: Yes Does the patient have difficulty concentrating, remembering, or making decisions?: No Patient able to express need for assistance with ADLs?: Yes Does the patient have difficulty dressing or bathing?: No Independently performs ADLs?: Yes (appropriate for developmental age) Does the patient have difficulty walking or climbing stairs?: Yes Weakness of Legs: Left Weakness of Arms/Hands: None  Permission Sought/Granted Permission sought to share information with : Case Manager       Permission granted to share info w AGENC[facility]        Emotional Assessment Appearance:: Appears stated age Attitude/Demeanor/Rapport: Engaged Affect (typically observed): Accepting Orientation: : Oriented to Self, Oriented to Place, Oriented to  Time, Oriented to Situation Alcohol / Substance Use: Illicit Drugs Psych Involvement: No (comment)  Admission diagnosis:  Cellulitis of left lower extremity [L03.116] Patient Active Problem List   Diagnosis Date Noted  . Polysubstance abuse (HCC) 12/12/2018  . Moderate protein-calorie malnutrition (HCC)   . Left foot infection 12/11/2018  . Hyperglycemia 12/11/2018  . Normocytic anemia 12/11/2018  . Cellulitis and abscess of foot 12/10/2018  . Sepsis (  HCC) 12/10/2018  . Wound infection 12/10/2018  . Allergic rhinitis 12/11/2011  . Hyperhidrosis 08/01/2011   PCP:  Margaree Mackintosh, MD Pharmacy:   [facility] [phone] [place], [place] - [phone] AT [facility] [address] [place] [address] Phone: [phone] Fax: [phone]     Social Determinants of Health (SDOH) Interventions    Readmission Risk Interventions No flowsheet data found.

## 2018-12-12 NOTE — Progress Notes (Signed)
[facility]                                                                              Patient Demographics  Derek Jennings, is a 33 y.o. male, DOB - June 04, 1986, VFI:[id number]  Admit date - 12/10/2018   Admitting Physician Toy Baker, MD  Outpatient Primary MD for the patient is Baxley, Cresenciano Lick, MD  Outpatient specialists:   LOS - 2  days   Medical records reviewed and are as summarized below:    Chief Complaint  Patient presents with  . bleeding from foot       Brief summary   Patient is a 33 year old male with history of polysubstance abuse, IVDU presented with left foot pain for almost a week.  Patient went to the urgent care and was started on prednisone.  3 days prior to admission, patient came to [facility] long ED and was started on clindamycin and he continued on prednisone. Per patient, he noticed that his foot was getting worse, wound burst open, draining and turning black in color.  Patient also states that he has been clean for the last few weeks with the drugs.  Patient was seen by orthopedics, Dr.Norris, patient underwent I&D in the OR on 5/10.  Wound VAC was placed.  Patient was transferred to [facility] to be evaluated by Dr. Sharol Given for possible repeat procedure/surgical options.  Assessment & Plan    Principal Problem:   Cellulitis and abscess of foot: Failed outpatient management -Patient presented with the left foot abscess.  X-ray showed no acute bony abnormality, considerable swelling, no osteomyelitis -Patient was placed on broad-spectrum antibiotics including IV vancomycin, ceftriaxone and Flagyl -MRSA PCR positive.  Blood cultures negative so far, wound culture shows rare gram-positive cocci, few gram-negative rods,  final pending -Orthopedics was consulted, patient underwent I&D in the OR on 5/10 with wound VAC placement at [facility] long.  Postop day #1 -Patient was transferred to [facility] to be evaluated by Dr.  Sharol Given for surgical options -Seen by Dr. Sharol Given, plan for repeat I&D on 5/12, may require split-thickness skin graft and long-term antibiotics. -Pain currently controlled    Active Problems:   Sepsis ([place]) due to #1 -Patient met sepsis criteria at the time of admission with elevated WBC count, tachypnea, source of infection in the left foot, abscess -Blood cultures are so far negative, continue IV antibiotics, follow wound culture -DC IV fluids, leukocytosis improving, afebrile    Hyperglycemia -Likely reactive, hemoglobin A1c 5.5    Normocytic anemia -H&H stable, 11.7 today, also has some component of hemodilution from fluids    Moderate protein-calorie malnutrition (HCC) -Continue nutritional supplements, albumin 2.6    Polysubstance abuse ([place]) -Patient encouraged to stay clean, UDS was positive for opiates. -Patient was counseled on tobacco cessation and recreational drug cessation.  Code Status: CODE STATUS DVT Prophylaxis:  Lovenox  Family Communication: Discussed in detail with the patient, all imaging results, lab results explained to the patient    Disposition Plan: Plan for OR in a.m. for repeat I&D and possible split thickness skin graft  Time Spent in minutes   35 minutes  Procedures:  5/10 I&D with wound VAC placement  Consultants:   Orthopedics  Antimicrobials:   Anti-infectives (From admission, onward)   Start     Dose/Rate Route Frequency Ordered Stop   12/11/18 1600  metroNIDAZOLE (FLAGYL) IVPB 500 mg     500 mg 100 mL/hr over 60 Minutes Intravenous Every 8 hours 12/11/18 1552     12/11/18 0645  vancomycin (VANCOCIN) 1,250 mg in sodium chloride 0.9 % 250 mL IVPB     1,250 mg 166.7 mL/hr over 90 Minutes Intravenous Every 8 hours 12/11/18 0634     12/11/18 0115  metroNIDAZOLE (FLAGYL) IVPB 500 mg  Status:  Discontinued     500 mg 100 mL/hr over 60 Minutes Intravenous Every 8 hours 12/11/18 0107 12/11/18 1552   12/11/18 0110  metroNIDAZOLE (FLAGYL)  5-0.79 MG/ML-% IVPB    Note to Pharmacy:  Ward, Christa   : cabinet override      12/11/18 0110 12/11/18 0115   12/11/18 0109  sodium chloride 0.9 % with cefTRIAXone (ROCEPHIN) ADS Med    Note to Pharmacy:  Ward, Christa   : cabinet override      12/11/18 0109 12/11/18 0115   12/11/18 0107  cefTRIAXone (ROCEPHIN) 2 g in sodium chloride 0.9 % 100 mL IVPB     2 g 200 mL/hr over 30 Minutes Intravenous Every 24 hours 12/11/18 0107     12/10/18 2215  vancomycin (VANCOCIN) 1,500 mg in sodium chloride 0.9 % 500 mL IVPB     1,500 mg 250 mL/hr over 120 Minutes Intravenous  Once 12/10/18 2202 12/11/18 0100         Medications  Scheduled Meds: . docusate sodium  100 mg Oral BID  . enoxaparin (LOVENOX) injection  40 mg Subcutaneous Q24H  . pneumococcal 23 valent vaccine  0.5 mL Intramuscular Tomorrow-1000   Continuous Infusions: . cefTRIAXone (ROCEPHIN)  IV 2 g (12/12/18 0155)  . metronidazole 500 mg (12/12/18 0944)  . vancomycin 1,250 mg (12/12/18 0550)   PRN Meds:.acetaminophen **OR** acetaminophen, HYDROmorphone (DILAUDID) injection, metoCLOPramide **OR** metoCLOPramide (REGLAN) injection, ondansetron **OR** ondansetron (ZOFRAN) IV, oxyCODONE      Subjective:   Silas was seen and examined today.  Currently no acute issues, once IV fluids to be off.  No fevers or chills.  Pain in the left foot controlled. Patient denies dizziness, chest pain, shortness of breath, abdominal pain, N/V/D/C, new weakness, numbess, tingling. No acute events overnight.    Objective:   Vitals:   12/11/18 2014 12/11/18 2337 12/12/18 0416 12/12/18 0815  BP: 123/71 123/75 122/73 116/66  Pulse: 76 83 71 76  Resp: _0 Temp: 98.8 F (37.1 C) 98.6 F (37 C) 98.7 F (37.1 C) 98.5 F (36.9 C)  TempSrc: Oral Oral Oral Oral  SpO2: 100% 97% 100% 99%  Weight:      Height:        Intake/Output Summary (Last 24 hours) at 12/12/2018 1058 Last data filed at 12/12/2018 0837 Gross per 24 hour   Intake 0 ml  Output 3970 ml  Net -3970 ml     Wt Readings from Last 3 Encounters:  12/11/18 88.5 kg  11/22/18 90.7 kg  10/10/16 72.6 kg     Exam  General: Alert and oriented x 3, NAD  Eyes:   HEENT:  Atraumatic, normocephalic, normal oropharynx  Cardiovascular: S1 S2 auscultated,  Regular rate and rhythm.  Respiratory: CTAB, no wheezing, rales or rhonchi  Gastrointestinal: Soft, nontender, nondistended, + bowel sounds  Ext: no pedal edema bilaterally  Neuro: No new deficits  Musculoskeletal: No digital cyanosis, clubbing  Skin: Wound VAC on the left foot  Psych: Normal affect and demeanor, alert and oriented x3    Data Reviewed:  I have personally reviewed following labs and imaging studies  Micro Results Recent Results (from the past 240 hour(s))  Blood culture (routine x 2)     Status: None (Preliminary result)   Collection Time: 12/08/18  5:31 PM  Result Value Ref Range Status   Specimen Description   Final    BLOOD RIGHT FOREARM Performed at [facility], [address], [place], [place] [address]    Special Requests   Final    BOTTLES DRAWN AEROBIC AND ANAEROBIC Blood Culture results may not be optimal due to an inadequate volume of blood received in culture bottles Performed at [facility] [address], [place], [place] [address]    Culture   Final    NO GROWTH 4 DAYS Performed at [facility], [place] [address], [place], [place] [address]    Report Status PENDING  Incomplete  Blood culture (routine x 2)     Status: None (Preliminary result)   Collection Time: 12/08/18  5:38 PM  Result Value Ref Range Status   Specimen Description   Final    BLOOD LEFT ANTECUBITAL Performed at [facility] [address] [place], [place] [address]    Special Requests   Final    BOTTLES DRAWN AEROBIC AND ANAEROBIC Blood Culture adequate volume Performed at [facility] [address] [place], [place] [address]    Culture   Final    NO GROWTH 4 DAYS Performed at [facility], [place] [address] [place], [place] [address]    Report Status PENDING  Incomplete  Blood culture (routine x 2)     Status: None (Preliminary result)   Collection Time: 12/10/18  8:46 PM  Result Value Ref Range Status   Specimen Description   Final    BLOOD RIGHT HAND Performed at [facility] [address], [place], [place] [address]    Special Requests   Final    BOTTLES DRAWN AEROBIC AND ANAEROBIC Blood Culture results may not be optimal due to an excessive volume of blood received in culture bottles Performed at [facility] [address], [place], [place] [address]    Culture   Final    NO GROWTH 1 DAY Performed at [facility], [place] [address], [place], [place] [address]    Report Status PENDING  Incomplete  Blood culture (routine x 2)     Status: None (Preliminary result)   Collection Time: 12/10/18  8:51 PM  Result Value Ref Range Status   Specimen Description   Final    BLOOD LEFT ANTECUBITAL Performed at [facility] [address], [place], [place] [address]    Special Requests   Final    BOTTLES DRAWN AEROBIC AND ANAEROBIC Blood Culture adequate volume Performed at [facility] [address], [place], [place] [address]    Culture   Final    NO GROWTH 1 DAY Performed at [facility], [place] [address], [place], [place] [address]    Report Status PENDING  Incomplete  SARS Coronavirus 2 [facility] order, Performed in [place] hospital lab)     Status: None   Collection Time: 12/10/18 11:00  PM  Result Value Ref Range Status   SARS Coronavirus 2 NEGATIVE NEGATIVE Final    Comment: (NOTE) If result is NEGATIVE SARS-CoV-2 target nucleic acids are NOT DETECTED. The SARS-CoV-2 RNA is generally detectable in upper and lower  respiratory specimens during the acute phase of infection.  The lowest  concentration of SARS-CoV-2 viral copies this assay can detect is 250  copies / mL. A negative result does not preclude SARS-CoV-2 infection  and should not be used as the sole basis for treatment or other  patient management decisions.  A negative result may occur with  improper specimen collection / handling, submission of specimen other  than nasopharyngeal swab, presence of viral mutation(s) within the  areas targeted by this assay, and inadequate number of viral copies  (<250 copies / mL). A negative result must be combined with clinical  observations, patient history, and epidemiological information. If result is POSITIVE SARS-CoV-2 target nucleic acids are DETECTED. The SARS-CoV-2 RNA is generally detectable in upper and lower  respiratory specimens dur ing the acute phase of infection.  Positive  results are indicative of active infection with SARS-CoV-2.  Clinical  correlation with patient history and other diagnostic information is  necessary to determine patient infection status.  Positive results do  not rule out bacterial infection or co-infection with other viruses. If result is PRESUMPTIVE POSTIVE SARS-CoV-2 nucleic acids MAY BE PRESENT.   A presumptive positive result was obtained on the submitted specimen  and confirmed on repeat testing.  While 2019 novel coronavirus  (SARS-CoV-2) nucleic acids may be present in the submitted sample  additional confirmatory testing may be necessary for epidemiological  and / or clinical management purposes  to differentiate between  SARS-CoV-2 and other Sarbecovirus currently known to infect humans.  If clinically indicated additional testing with an alternate test  methodology [phone]) is advised. The SARS-CoV-2 RNA is generally  detectable in upper and lower respiratory sp ecimens during the acute  phase of infection. The expected result is Negative. Fact Sheet for Patients:  StrictlyIdeas.no  Fact Sheet for Healthcare Providers: BankingDealers.co.za This test is not yet approved or cleared by the [place] FDA and has been authorized for detection and/or diagnosis of SARS-CoV-2 by FDA under an Emergency Use Authorization (EUA).  This EUA will remain in effect (meaning this test can be used) for the duration of the COVID-19 declaration under Section 564(b)(1) of the Act, 21 U.S.C. section 360bbb-3(b)(1), unless the authorization is terminated or revoked sooner. Performed at [facility], [place] [address], [place], [place] [address]   Aerobic/Anaerobic Culture (surgical/deep wound)     Status: None (Preliminary result)   Collection Time: 12/11/18  1:08 AM  Result Value Ref Range Status   Specimen Description   Final    WOUND LEFT FOOT Performed at [facility], [place] [address] [place], [place] [address]    Special Requests   Final    NONE Performed at [facility], [place] [address] [place], [place] [address]    Gram Stain   Final    RARE WBC PRESENT, PREDOMINANTLY PMN FEW GRAM NEGATIVE RODS RARE GRAM POSITIVE COCCI IN CLUSTERS Performed at [facility], [place] [address], [place], [place] [address]    Culture PENDING  Incomplete   Report Status PENDING  Incomplete  MRSA PCR Screening     Status: Abnormal   Collection Time: 12/11/18 12:15 PM  Result Value Ref Range Status   MRSA by PCR  POSITIVE (A) NEGATIVE Final    Comment:        The GeneXpert MRSA Assay (FDA approved for NASAL specimens only), is one component of a comprehensive MRSA colonization surveillance program. It is not intended to diagnose MRSA infection nor to guide or monitor treatment for MRSA infections. RESULT CALLED TO, READ BACK BY AND VERIFIED WITH: C GRIFFITH,RN 12/11/18 1434 RHOLMES Performed at [facility], [place] [address] [place], [place] [address]     Radiology Reports Dg Foot Complete Left   Result Date: 12/10/2018 CLINICAL DATA:  Increased soft tissue swelling with possible underlying infection. EXAM: LEFT FOOT - COMPLETE 3+ VIEW COMPARISON:  12/08/2018 FINDINGS: No acute bony abnormality is noted. Considerable soft tissue swelling is noted along the dorsum of the foot with mild soft tissue irregularity consistent with the given clinical history. No radiopaque foreign body is noted. No changes of osteomyelitis are seen. IMPRESSION: Soft tissue infection along the dorsum of the foot. No acute bony abnormality is noted. Electronically Signed   By: Inez Catalina M.D.   On: 12/10/2018 21:53   Dg Foot Complete Left  Result Date: 12/08/2018 CLINICAL DATA:  Pt states he has possible bug bite to left foot, on Monday. Pt c/o swollen and painful to touch on left foot. EXAM: LEFT FOOT - COMPLETE 3+ VIEW COMPARISON:  None. FINDINGS: There is no evidence of fracture or dislocation. There is no evidence of arthropathy or other focal bone abnormality. There is relative pes planus. There is soft tissue swelling along the dorsal aspect of the foot. IMPRESSION: 1.  No acute osseous injury of the left foot. 2. Severe soft tissue swelling along the dorsal aspect of the foot consistent with cellulitis. Electronically Signed   By: Kathreen Devoid   On: 12/08/2018 17:34   Vas [place] Abi With/wo Tbi  Result Date: 12/12/2018 LOWER EXTREMITY DOPPLER STUDY Indications: Left foot wound.  Performing Technologist: June Leap RDMS, RVT  Examination Guidelines: A complete evaluation includes at minimum, Doppler waveform signals and systolic blood pressure reading at the level of bilateral brachial, anterior tibial, and posterior tibial arteries, when vessel segments are accessible. Bilateral testing is considered an integral part of a complete examination. Photoelectric Plethysmograph (PPG) waveforms and toe systolic pressure readings are included as required and additional duplex testing as needed. Limited examinations for  reoccurring indications may be performed as noted.  ABI Findings: +---------+------------------+-----+---------+--------+ Right    Rt Pressure (mmHg)IndexWaveform Comment  +---------+------------------+-----+---------+--------+ Brachial 160                    triphasic         +---------+------------------+-----+---------+--------+ ATA      163               1.02 triphasic         +---------+------------------+-----+---------+--------+ PTA      155               0.97 triphasic         +---------+------------------+-----+---------+--------+ Great Toe153               0.96 Normal            +---------+------------------+-----+---------+--------+ +---------+------------------+-----+---------+-------+ Left     Lt Pressure (mmHg)IndexWaveform Comment +---------+------------------+-----+---------+-------+ Brachial 152                    triphasic        +---------+------------------+-----+---------+-------+ PTA  triphasic        +---------+------------------+-----+---------+-------+ Doristine Devoid Toe139               0.87 Normal           +---------+------------------+-----+---------+-------+ Unable to obtain ATA waveform or pressure due to patient pain intolerance at that area. Unable to obtain PTA pressure due to patient pain intolerance of cuff. PTA waveform appears normal.  Summary: Right: Resting right ankle-brachial index is within normal range. No evidence of significant right lower extremity arterial disease. The right toe-brachial index is normal. Left: The left toe-brachial index is normal. See notes above- unable to obtain ABI.  *See table(s) above for measurements and observations.    Preliminary     Lab Data:  CBC: Recent Labs  Lab 12/10/18 2046 12/11/18 0422 12/12/18 0717  WBC 16.3* 14.2* 9.6  NEUTROABS 12.0*  --   --   HGB 13.4 12.2* 11.7*  HCT 39.9 37.2* 35.4*  MCV 92.4 93.7 91.0  PLT 380 393 867*   Basic Metabolic  Panel: Recent Labs  Lab 12/10/18 2046 12/11/18 0422 12/12/18 0717  NA 136 138 138  K 3.7 3.8 4.3  CL 103 104 98  CO2 _0 GLUCOSE 111* 118* 96  BUN _1 CREATININE 0.81 0.68 0.86  CALCIUM 8.3* 7.9* 8.6*  MG  --  2.0  --   PHOS  --  3.3  --    GFR: Estimated Creatinine Clearance: 137.9 mL/min (by C-G formula based on SCr of 0.86 mg/dL). Liver Function Tests: Recent Labs  Lab 12/10/18 2046 12/11/18 0422  AST 17 14*  ALT 32 25  ALKPHOS 81 77  BILITOT 0.6 0.5  PROT 6.8 6.1*  ALBUMIN 3.0* 2.6*   No results for input(s): LIPASE, AMYLASE in the last 168 hours. No results for input(s): AMMONIA in the last 168 hours. Coagulation Profile: Recent Labs  Lab 12/11/18 0422  INR 1.2   Cardiac Enzymes: No results for input(s): CKTOTAL, CKMB, CKMBINDEX, TROPONINI in the last 168 hours. BNP (last 3 results) No results for input(s): PROBNP in the last 8760 hours. HbA1C: Recent Labs    12/11/18 0422  HGBA1C 5.5   CBG: No results for input(s): GLUCAP in the last 168 hours. Lipid Profile: No results for input(s): CHOL, HDL, LDLCALC, TRIG, CHOLHDL, LDLDIRECT in the last 72 hours. Thyroid Function Tests: Recent Labs    12/11/18 0422  TSH 1.749   Anemia Panel: No results for input(s): VITAMINB12, FOLATE, FERRITIN, TIBC, IRON, RETICCTPCT in the last 72 hours. Urine analysis:    Component Value Date/Time   COLORURINE AMBER (A) 06/25/2015 1619   APPEARANCEUR CLEAR 06/25/2015 1619   LABSPEC 1.039 (H) 06/25/2015 1619   PHURINE 7.5 06/25/2015 1619   GLUCOSEU NEGATIVE 06/25/2015 1619   HGBUR NEGATIVE 06/25/2015 1619   BILIRUBINUR small 07/02/2015 1622   KETONESUR TRACE (A) 06/25/2015 1619   PROTEINUR trace 07/02/2015 1622   PROTEINUR 2+ (A) 06/25/2015 1619   UROBILINOGEN 0.2 07/02/2015 1622   NITRITE neg 07/02/2015 1622   NITRITE NEGATIVE 06/25/2015 1619   LEUKOCYTESUR Negative 07/02/2015 1622     Chimaobi Casebolt M.D. [facility] 12/12/2018, 10:58 AM   Pager: [phone] Between 7am to 7pm - call Pager - [phone]  After 7pm go to [email or web address] - password [email or web address]  Call night coverage person covering after 7pm

## 2018-12-12 NOTE — Progress Notes (Addendum)
Initial Nutrition Assessment  RD working remotely.  DOCUMENTATION CODES:   Not applicable  INTERVENTION:   -Continue Ensure Enlive po BID, each supplement provides 350 kcal and 20 grams of protein -MVI with minerals daily  NUTRITION DIAGNOSIS:   Increased nutrient needs related to wound healing as evidenced by estimated needs.  GOAL:   Patient will meet greater than or equal to 90% of their needs  MONITOR:   PO intake, Supplement acceptance, Diet advancement, Labs, Weight trends, Skin, I & O's  REASON FOR ASSESSMENT:   Consult Assessment of nutrition requirement/status, Wound healing  ASSESSMENT:   Derek Jennings is a 33 y.o. male with medical history significant of polysubstance abuse, IVDU Presented with   Foot pain on  6 days ago Monday started to turned pink He urgent care next day because  it was turning redHe was started on prednisone has been on prednisone for the past 5 days.  3 days ago   he came to [facility] time he felt to have cellulitis and was started on clindamycin unfortunately he continued to take prednisone  Pt admitted with cellulitis and abscess of lt foot.   5/10- s/p Procedure(s): IRRIGATION AND DEBRIDEMENT EXTREMITY placement of wound vac (Left)  Reviewed I/O's: -3.2 L x 24 hours and -775 ml since admission  UOP: 3.2 L x 24 hours   Drain output: 40 ml x 24 hours  Spoke with pt on phone, who was very pleasant and in good spirits today. He reports a great appetite- currently eating a cheeseburger for lunch (pt shares he did not sleep well yesterday so ordered a late lunch). PTA he was consuming 3 meals per day (Breakfast: cereal or bacon and eggs; Lunch and Dinner: leftovers, typically a meat, starch, and vegetable). Beverages include water gingerale, vitamin water, and pepsi.   Pt denies any weight loss. He shares his UBW is around 190-195#. He denies any changes in mobility PTA.   Per orthopedics notes, plan for repeat I&D tomorrow. Discussed  with pt importance of good meal and supplement intake to promote healing. Pt reports consuming a strawberry Ensure supplement earlier, and is willing continue to take supplements if it will help assist with would healing. Pt reports being anxious about being in the hospital (reports this is his first time being hospitalized); RD provided pt with emotional support.   Albumin has a half-life of 21 days and is strongly affected by stress response and inflammatory process, therefore, do not expect to see an improvement in this lab value during acute hospitalization. When a patient presents with low albumin, it is likely skewed due to the acute inflammatory response.  Unless it is suspected that patient had poor PO intake or malnutrition prior to admission, then RD should not be consulted solely for low albumin. Note that low albumin is no longer used to diagnose malnutrition; [place] uses the new malnutrition guidelines published by the American Society for Parenteral and Enteral Nutrition (A.S.P.E.N.) and the Academy of Nutrition and Dietetics (AND).    Lab Results  Component Value Date   HGBA1C 5.5 12/11/2018   PTA DM medications are none.   Labs reviewed:K, Mg, and Phos WDL.  NUTRITION - FOCUSED PHYSICAL EXAM:    Most Recent Value  Orbital Region  Unable to assess  Upper Arm Region  Unable to assess  Thoracic and Lumbar Region  Unable to assess  Buccal Region  Unable to assess  Temple Region  Unable to assess  Clavicle Bone Region  Unable to assess  Clavicle and Acromion Bone Region  Unable to assess  Scapular Bone Region  Unable to assess  Dorsal Hand  Unable to assess  Patellar Region  Unable to assess  Anterior Thigh Region  Unable to assess  Posterior Calf Region  Unable to assess  Edema (RD Assessment)  Unable to assess  Hair  Unable to assess  Eyes  Unable to assess  Mouth  Unable to assess  Skin  Unable to assess  Nails  Unable to assess       Diet Order:   Diet Order             Diet NPO time specified Except for: Ice Chips, Sips with Meds  Diet effective midnight        Diet regular Room service appropriate? Yes; Fluid consistency: Thin  Diet effective now              EDUCATION NEEDS:   Education needs have been addressed  Skin:  Skin Assessment: Skin Integrity Issues: Skin Integrity Issues:: Wound VAC Wound Vac: lt foot  Last BM:  12/06/18  Height:   Ht Readings from Last 1 Encounters:  12/11/18 6' (1.829 m)    Weight:   Wt Readings from Last 1 Encounters:  12/11/18 88.5 kg    Ideal Body Weight:  80.9 kg  BMI:  Body mass index is 26.45 kg/m.  Estimated Nutritional Needs:   Kcal:  2200-2400  Protein:  120-135 grams  Fluid:  > 2.2 L    Coby Shrewsberry A. Mayford KnifeWilliams, RD, LDN, CDCES Registered Dietitian II Certified Diabetes Care and Education Specialist Pager: [phone] After hours Pager: [phone]

## 2018-12-12 NOTE — Progress Notes (Signed)
Re: ABI order.  2+ pedal pulses have been documented by physician at admission.  Will contact Dr. Lajoyce Corners to see if ABI is still need prior to I&D tomorrow.   Vascular lab [phone] Jeb Levering, BS, RDMS, RVT

## 2018-12-12 NOTE — Consult Note (Signed)
ORTHOPAEDIC CONSULTATION  REQUESTING PHYSICIAN: Cathren Harshai, Ripudeep K, MD  Chief Complaint: Ulcer dorsum left foot.  HPI: Derek Jennings is a 33 y.o. male who presents with ulcer dorsum of the left foot.  Patient states he feels like this may have come from a bug bite.  Patient states he drinks a lot of sodas but does not have a history of diabetes.  Past Medical History:  Diagnosis Date  . ADD (attention deficit disorder)   . Allergy   . Hyperhidrosis    Past Surgical History:  Procedure Laterality Date  . ADENOIDECTOMY    . I&D EXTREMITY Left 12/10/2018   Procedure: IRRIGATION AND DEBRIDEMENT EXTREMITY placement of wound vac;  Surgeon: Beverely LowNorris, Steve, MD;  Location: [facility];  Service: Orthopedics;  Laterality: Left;   Social History   Socioeconomic History  . Marital status: Single    Spouse name: Not on file  . Number of children: Not on file  . Years of education: Not on file  . Highest education level: Not on file  Occupational History  . Not on file  Social Needs  . Financial resource strain: Not on file  . Food insecurity:    Worry: Not on file    Inability: Not on file  . Transportation needs:    Medical: Not on file    Non-medical: Not on file  Tobacco Use  . Smoking status: Current Every Day Smoker    Packs/day: 0.20  . Smokeless tobacco: Former Engineer, [employer]  Substance and Sexual Activity  . Alcohol use: Yes    Comment: socially  . Drug use: No  . Sexual activity: Not on file  Lifestyle  . Physical activity:    Days per week: Not on file    Minutes per session: Not on file  . Stress: Not on file  Relationships  . Social connections:    Talks on phone: Not on file    Gets together: Not on file    Attends religious service: Not on file    Active member of club or organization: Not on file    Attends meetings of clubs or organizations: Not on file    Relationship status: Not on file  Other Topics Concern  . Not on file  Social History Narrative  . Not on file    Family History  Problem Relation Age of Onset  . Hypertension Mother   . Heart disease Father   . Stroke Father   . Diabetes Other    - negative except otherwise stated in the family history section Allergies  Allergen Reactions  . Amoxicillin Hives  . Penicillins Hives and Rash    Did it involve swelling of the face/tongue/throat, SOB, or low BP? Unknown Did it involve sudden or severe rash/hives, skin peeling, or any reaction on the inside of your mouth or nose? Unknown Did you need to seek medical attention at a hospital or doctor's office? Unknown When did it last happen? If all above answers are "NO", may proceed with cephalosporin use.    Prior to Admission medications   Medication Sig Start Date End Date Taking? Authorizing Provider  clindamycin (CLEOCIN) 300 MG capsule Take 1 capsule (300 mg total) by mouth 3 (three) times daily for 5 days. 12/08/18 12/13/18 Yes Henderly, Britni A, PA-C  diphenhydrAMINE (BENADRYL) 25 MG tablet Take 25 mg by mouth 3 (three) times daily.   Yes [provider]  famotidine (PEPCID) 10 MG tablet Take 10 mg by mouth 3 (three)  times daily.   Yes [provider]  glycopyrrolate (ROBINUL) 2 MG tablet Take 2 mg by mouth daily.     Yes [provider]  ibuprofen (ADVIL) 200 MG tablet Take 800-1,000 mg by mouth every 6 (six) hours as needed for moderate pain.   Yes [provider]  loratadine (CLARITIN) 10 MG tablet Take 10 mg by mouth daily.   Yes [provider]  Melatonin 5 MG TABS Take 5 mg by mouth at bedtime as needed (sleep).   Yes [provider]  predniSONE (STERAPRED UNI-PAK 21 TAB) 10 MG (21) TBPK tablet Take 10 mg by mouth See admin instructions. Listed on package 12/06/18  Yes [provider]  vitamin C (ASCORBIC ACID) 500 MG tablet Take 1,500 mg by mouth daily.   Yes [provider]  doxycycline (VIBRA-TABS) 100 MG tablet Take 1 tablet (100 mg total) by mouth 2 (two)  times daily. Patient not taking: Reported on 11/22/2018 07/03/15   Margaree Mackintosh, MD  promethazine (PHENERGAN) 25 MG tablet Take 1 tablet (25 mg total) by mouth every 6 (six) hours as needed for nausea or vomiting. Patient not taking: Reported on 11/22/2018 06/20/15   Margaree Mackintosh, MD   Dg Foot Complete Left  Result Date: 12/10/2018 CLINICAL DATA:  Increased soft tissue swelling with possible underlying infection. EXAM: LEFT FOOT - COMPLETE 3+ VIEW COMPARISON:  12/08/2018 FINDINGS: No acute bony abnormality is noted. Considerable soft tissue swelling is noted along the dorsum of the foot with mild soft tissue irregularity consistent with the given clinical history. No radiopaque foreign body is noted. No changes of osteomyelitis are seen. IMPRESSION: Soft tissue infection along the dorsum of the foot. No acute bony abnormality is noted. Electronically Signed   By: Alcide Clever M.D.   On: 12/10/2018 21:53   - pertinent xrays, CT, MRI studies were reviewed and independently interpreted  Positive ROS: All other systems have been reviewed and were otherwise negative with the exception of those mentioned in the HPI and as above.  Physical Exam: General: Alert, no acute distress Psychiatric: Patient is competent for consent with normal mood and affect Lymphatic: No axillary or cervical lymphadenopathy Cardiovascular: No pedal edema Respiratory: No cyanosis, no use of accessory musculature GI: No organomegaly, abdomen is soft and non-tender    Images:  @ENCIMAGES @  Labs:  Lab Results  Component Value Date   HGBA1C 5.5 12/11/2018   ESRSEDRATE 41 (H) 12/11/2018   CRP 16.8 (H) 12/11/2018   REPTSTATUS PENDING 12/11/2018   GRAMSTAIN  12/11/2018    RARE WBC PRESENT, PREDOMINANTLY PMN FEW GRAM NEGATIVE RODS RARE GRAM POSITIVE COCCI IN CLUSTERS Performed at [facility], [address] [place], [place] [address]    CULT PENDING 12/11/2018    Lab Results  Component Value Date    ALBUMIN 2.6 (L) 12/11/2018   ALBUMIN 3.0 (L) 12/10/2018   ALBUMIN 4.6 10/10/2016   PREALBUMIN 12.3 (L) 12/11/2018    Neurologic: Patient does not have protective sensation bilateral lower extremities.   MUSCULOSKELETAL:   Skin: Examination the wound VAC is in place on the left foot.  There is about 325 cc in the wound VAC canister.  There is no ascending cellulitis.  No ulcers on the plantar aspect of the left foot.  Patient does have a slightly elevated hemoglobin A1c at 5.5 most likely from his diet.  He does have protein caloric malnutrition with an albumin of 2.6.  Review of the radiographs shows  no destructive bony changes.  Patient is status post irrigation and debridement of the abscess left foot.  Patient's PCR screen was positive for MRSA.  Wound cultures show gram-positive cocci and gram-negative rods. SARS  coronavirus 2 nasal swab is negative.  Assessment: Assessment: Moderate protein caloric malnutrition with abscess and ulceration left foot postoperative day 1.  Plan: Plan: We will plan for repeat irrigation and debridement on Tuesday.  Patient may require split-thickness skin graft.  May require long-term antibiotics depending on the tissue viability at repeat surgery on Tuesday.  Thank you for the consult and the opportunity to see Derek Jennings   , MD [facility] [phone] 7:13 AM     

## 2018-12-13 ENCOUNTER — Inpatient Hospital Stay (HOSPITAL_COMMUNITY): Payer: Self-pay | Admitting: Anesthesiology

## 2018-12-13 ENCOUNTER — Encounter (HOSPITAL_COMMUNITY): Payer: Self-pay | Admitting: Anesthesiology

## 2018-12-13 ENCOUNTER — Encounter (HOSPITAL_COMMUNITY)
Admission: EM | Disposition: A | Discharge status: Home / Self Care | Payer: Self-pay | Source: Home / Self Care | Attending: Internal Medicine

## 2018-12-13 HISTORY — PX: I & D EXTREMITY: SHX5045

## 2018-12-13 HISTORY — PX: APPLICATION OF WOUND VAC: SHX5189

## 2018-12-13 LAB — BASIC METABOLIC PANEL
Anion gap: 13 (ref 5–15)
BUN: 10 mg/dL (ref 6–20)
CO2: 25 mmol/L (ref 22–32)
Calcium: 8.7 mg/dL — ABNORMAL LOW (ref 8.9–10.3)
Chloride: 98 mmol/L (ref 98–111)
Creatinine, Ser: 0.78 mg/dL (ref 0.61–1.24)
GFR calc Af Amer: >60 mL/min (ref >60)
GFR calc non Af Amer: >60 mL/min (ref >60)
Glucose, Bld: 98 mg/dL (ref 70–99)
Potassium: 4.4 mmol/L (ref 3.5–5.1)
Sodium: 136 mmol/L (ref 135–145)

## 2018-12-13 LAB — CULTURE, BLOOD (ROUTINE X 2)
Culture: NO GROWTH
Culture: NO GROWTH
Special Requests: ADEQUATE

## 2018-12-13 LAB — CBC
HCT: 35.1 % — ABNORMAL LOW (ref 39.0–52.0)
Hemoglobin: 11.9 g/dL — ABNORMAL LOW (ref 13.0–17.0)
MCH: 30.3 pg (ref 26.0–34.0)
MCHC: 33.9 g/dL (ref 30.0–36.0)
MCV: 89.3 fL (ref 80.0–100.0)
Platelets: 439 10*3/uL — ABNORMAL HIGH (ref 150–400)
RBC: 3.93 MIL/uL — ABNORMAL LOW (ref 4.22–5.81)
RDW: 12.8 % (ref 11.5–15.5)
WBC: 8.3 10*3/uL (ref 4.0–10.5)
nRBC: 0 % (ref 0.0–0.2)

## 2018-12-13 SURGERY — IRRIGATION AND DEBRIDEMENT EXTREMITY
Anesthesia: General | Site: Foot | Laterality: Left

## 2018-12-13 MED ORDER — CHLORHEXIDINE GLUCONATE CLOTH 2 % EX PADS
6.0000 | MEDICATED_PAD | Freq: Every day | CUTANEOUS | Status: DC
  Administered 2018-12-13 – 2018-12-15 (×3): 6 via TOPICAL

## 2018-12-13 MED ORDER — METHOCARBAMOL 500 MG PO TABS
500.0000 mg | ORAL_TABLET | Freq: Four times a day (QID) | ORAL | Status: DC | PRN
  Filled 2018-12-13: qty 1

## 2018-12-13 MED ORDER — OXYCODONE HCL 5 MG PO TABS
ORAL_TABLET | ORAL | Status: AC
  Filled 2018-12-13: qty 3

## 2018-12-13 MED ORDER — ONDANSETRON HCL 4 MG/2ML IJ SOLN: 4.0000 mg | Freq: Once | INTRAMUSCULAR | Status: DC | PRN

## 2018-12-13 MED ORDER — ROCURONIUM BROMIDE 10 MG/ML (PF) SYRINGE
PREFILLED_SYRINGE | INTRAVENOUS | Status: AC
  Filled 2018-12-13: qty 10

## 2018-12-13 MED ORDER — OXYCODONE HCL 5 MG PO TABS
5.0000 mg | ORAL_TABLET | ORAL | Status: DC | PRN
  Administered 2018-12-14: 5 mg via ORAL
  Filled 2018-12-13 (×2): qty 1

## 2018-12-13 MED ORDER — METOCLOPRAMIDE HCL 5 MG PO TABS: 5.0000 mg | ORAL_TABLET | Freq: Three times a day (TID) | ORAL | Status: DC | PRN

## 2018-12-13 MED ORDER — ONDANSETRON HCL 4 MG PO TABS: 4.0000 mg | ORAL_TABLET | Freq: Four times a day (QID) | ORAL | Status: DC | PRN

## 2018-12-13 MED ORDER — MEPERIDINE HCL 25 MG/ML IJ SOLN: 6.2500 mg | INTRAMUSCULAR | Status: DC | PRN

## 2018-12-13 MED ORDER — HYDROCODONE-ACETAMINOPHEN 7.5-325 MG PO TABS: 1.0000 | ORAL_TABLET | Freq: Once | ORAL | Status: DC | PRN

## 2018-12-13 MED ORDER — FENTANYL CITRATE (PF) 250 MCG/5ML IJ SOLN
INTRAMUSCULAR | Status: AC
  Filled 2018-12-13: qty 5

## 2018-12-13 MED ORDER — ONDANSETRON HCL 4 MG/2ML IJ SOLN: 4.0000 mg | Freq: Four times a day (QID) | INTRAMUSCULAR | Status: DC | PRN

## 2018-12-13 MED ORDER — DOCUSATE SODIUM 100 MG PO CAPS: 100.0000 mg | ORAL_CAPSULE | Freq: Two times a day (BID) | ORAL | Status: DC

## 2018-12-13 MED ORDER — MIDAZOLAM HCL 2 MG/2ML IJ SOLN
INTRAMUSCULAR | Status: AC
  Filled 2018-12-13: qty 2

## 2018-12-13 MED ORDER — PROPOFOL 10 MG/ML IV BOLUS
INTRAVENOUS | Status: AC
  Filled 2018-12-13: qty 20

## 2018-12-13 MED ORDER — BISACODYL 10 MG RE SUPP: 10.0000 mg | Freq: Every day | RECTAL | Status: DC | PRN

## 2018-12-13 MED ORDER — POLYETHYLENE GLYCOL 3350 17 G PO PACK
17.0000 g | PACK | Freq: Every day | ORAL | Status: DC | PRN
  Administered 2018-12-15: 17 g via ORAL
  Filled 2018-12-13: qty 1

## 2018-12-13 MED ORDER — OXYCODONE HCL 5 MG PO TABS
10.0000 mg | ORAL_TABLET | ORAL | Status: DC | PRN
  Administered 2018-12-13 – 2018-12-14 (×2): 15 mg via ORAL
  Filled 2018-12-13: qty 3

## 2018-12-13 MED ORDER — LIDOCAINE 2% (20 MG/ML) 5 ML SYRINGE
INTRAMUSCULAR | Status: DC | PRN
  Administered 2018-12-13: 80 mg via INTRAVENOUS

## 2018-12-13 MED ORDER — HYDROMORPHONE HCL 1 MG/ML IJ SOLN
0.5000 mg | INTRAMUSCULAR | Status: DC | PRN
  Administered 2018-12-13: 0.5 mg via INTRAVENOUS
  Administered 2018-12-15: 1 mg via INTRAVENOUS
  Filled 2018-12-13 (×2): qty 1

## 2018-12-13 MED ORDER — LACTATED RINGERS IV SOLN
INTRAVENOUS | Status: DC
  Administered 2018-12-13 (×2): via INTRAVENOUS

## 2018-12-13 MED ORDER — LIDOCAINE 2% (20 MG/ML) 5 ML SYRINGE
INTRAMUSCULAR | Status: AC
  Filled 2018-12-13: qty 5

## 2018-12-13 MED ORDER — ONDANSETRON HCL 4 MG/2ML IJ SOLN
INTRAMUSCULAR | Status: DC | PRN
  Administered 2018-12-13: 4 mg via INTRAVENOUS

## 2018-12-13 MED ORDER — MIDAZOLAM HCL 2 MG/2ML IJ SOLN
INTRAMUSCULAR | Status: DC | PRN
  Administered 2018-12-13: 2 mg via INTRAVENOUS

## 2018-12-13 MED ORDER — ACETAMINOPHEN 325 MG PO TABS: 325.0000 mg | ORAL_TABLET | Freq: Four times a day (QID) | ORAL | Status: DC | PRN

## 2018-12-13 MED ORDER — 0.9 % SODIUM CHLORIDE (POUR BTL) OPTIME
TOPICAL | Status: DC | PRN
  Administered 2018-12-13: 1000 mL

## 2018-12-13 MED ORDER — HYDROMORPHONE HCL 1 MG/ML IJ SOLN
0.2500 mg | INTRAMUSCULAR | Status: DC | PRN
  Administered 2018-12-13 (×2): 0.5 mg via INTRAVENOUS

## 2018-12-13 MED ORDER — SUCCINYLCHOLINE CHLORIDE 200 MG/10ML IV SOSY
PREFILLED_SYRINGE | INTRAVENOUS | Status: AC
  Filled 2018-12-13: qty 10

## 2018-12-13 MED ORDER — FENTANYL CITRATE (PF) 100 MCG/2ML IJ SOLN
INTRAMUSCULAR | Status: DC | PRN
  Administered 2018-12-13 (×4): 50 ug via INTRAVENOUS

## 2018-12-13 MED ORDER — PROPOFOL 10 MG/ML IV BOLUS
INTRAVENOUS | Status: DC | PRN
  Administered 2018-12-13: 200 mg via INTRAVENOUS

## 2018-12-13 MED ORDER — METOCLOPRAMIDE HCL 5 MG/ML IJ SOLN: 5.0000 mg | Freq: Three times a day (TID) | INTRAMUSCULAR | Status: DC | PRN

## 2018-12-13 MED ORDER — HYDROMORPHONE HCL 1 MG/ML IJ SOLN
INTRAMUSCULAR | Status: AC
  Administered 2018-12-15: 01:00:00 1 mg via INTRAVENOUS
  Filled 2018-12-13: qty 1

## 2018-12-13 MED ORDER — SODIUM CHLORIDE 0.9 % IV SOLN
INTRAVENOUS | Status: DC
  Administered 2018-12-13: 16:00:00 via INTRAVENOUS

## 2018-12-13 MED ORDER — MAGNESIUM CITRATE PO SOLN: 1.0000 | Freq: Once | ORAL | Status: DC | PRN

## 2018-12-13 MED ORDER — METHOCARBAMOL 1000 MG/10ML IJ SOLN
500.0000 mg | Freq: Four times a day (QID) | INTRAVENOUS | Status: DC | PRN
  Filled 2018-12-13: qty 5

## 2018-12-13 MED ORDER — MUPIROCIN 2 % EX OINT
1.0000 "application " | TOPICAL_OINTMENT | Freq: Two times a day (BID) | CUTANEOUS | Status: DC
  Administered 2018-12-13 – 2018-12-15 (×5): 1 via NASAL
  Filled 2018-12-13 (×3): qty 22

## 2018-12-13 SURGICAL SUPPLY — 45 items
BLADE SURG 21 STRL SS (BLADE) ×3 IMPLANT
BNDG COHESIVE 6X5 TAN STRL LF (GAUZE/BANDAGES/DRESSINGS) IMPLANT
BNDG GAUZE ELAST 4 BULKY (GAUZE/BANDAGES/DRESSINGS) ×6 IMPLANT
CANISTER WOUNDNEG PRESSURE 500 (CANNISTER) ×2 IMPLANT
COVER SURGICAL LIGHT HANDLE (MISCELLANEOUS) ×2 IMPLANT
COVER WAND RF STERILE (DRAPES) ×3 IMPLANT
DRAPE U-SHAPE 47X51 STRL (DRAPES) ×3 IMPLANT
DRESSING VERAFLO CLEANSE CC (GAUZE/BANDAGES/DRESSINGS) IMPLANT
DRSG ADAPTIC 3X8 NADH LF (GAUZE/BANDAGES/DRESSINGS) ×1 IMPLANT
DRSG VERAFLO CLEANSE CC (GAUZE/BANDAGES/DRESSINGS) ×3
DURAPREP 26ML APPLICATOR (WOUND CARE) ×3 IMPLANT
ELECT REM PT RETURN 9FT ADLT (ELECTROSURGICAL)
ELECTRODE REM PT RTRN 9FT ADLT (ELECTROSURGICAL) IMPLANT
GAUZE SPONGE 4X4 12PLY STRL (GAUZE/BANDAGES/DRESSINGS) ×3 IMPLANT
GLOVE BIOGEL PI IND STRL 6.5 (GLOVE) IMPLANT
GLOVE BIOGEL PI IND STRL 7.5 (GLOVE) IMPLANT
GLOVE BIOGEL PI IND STRL 9 (GLOVE) ×1 IMPLANT
GLOVE BIOGEL PI INDICATOR 6.5 (GLOVE) ×4
GLOVE BIOGEL PI INDICATOR 7.5 (GLOVE) ×2
GLOVE BIOGEL PI INDICATOR 9 (GLOVE) ×2
GLOVE SURG ORTHO 9.0 STRL STRW (GLOVE) ×3 IMPLANT
GLOVE SURG SS PI 6.5 STRL IVOR (GLOVE) ×6 IMPLANT
GOWN STRL REUS W/ TWL XL LVL3 (GOWN DISPOSABLE) ×2 IMPLANT
GOWN STRL REUS W/TWL XL LVL3 (GOWN DISPOSABLE) ×6
GRAFT MESHED PRIMATRIX 10X12 (Graft) ×2 IMPLANT
HANDPIECE INTERPULSE COAX TIP (DISPOSABLE)
KIT BASIN OR (CUSTOM PROCEDURE TRAY) ×3 IMPLANT
KIT DRSG PREVENA PLUS 7DAY 125 (MISCELLANEOUS) ×2 IMPLANT
KIT TURNOVER KIT B (KITS) ×3 IMPLANT
MANIFOLD NEPTUNE II (INSTRUMENTS) ×1 IMPLANT
MICROMATRIX 500MG (Tissue) ×3 IMPLANT
NS IRRIG 1000ML POUR BTL (IV SOLUTION) ×3 IMPLANT
PACK ORTHO EXTREMITY (CUSTOM PROCEDURE TRAY) ×3 IMPLANT
PAD ARMBOARD 7.5X6 YLW CONV (MISCELLANEOUS) ×6 IMPLANT
PAD NEG PRESSURE SENSATRAC (MISCELLANEOUS) ×2 IMPLANT
SET HNDPC FAN SPRY TIP SCT (DISPOSABLE) IMPLANT
SOLUTION PARTIC MCRMTRX 500MG (Tissue) IMPLANT
STOCKINETTE IMPERVIOUS 9X36 MD (GAUZE/BANDAGES/DRESSINGS) IMPLANT
SUT ETHILON 2 0 PSLX (SUTURE) ×2 IMPLANT
SWAB COLLECTION DEVICE MRSA (MISCELLANEOUS) ×1 IMPLANT
SWAB CULTURE ESWAB REG 1ML (MISCELLANEOUS) IMPLANT
TOWEL OR 17X26 10 PK STRL BLUE (TOWEL DISPOSABLE) ×3 IMPLANT
TUBE CONNECTING 12'X1/4 (SUCTIONS) ×1
TUBE CONNECTING 12X1/4 (SUCTIONS) ×2 IMPLANT
YANKAUER SUCT BULB TIP NO VENT (SUCTIONS) ×3 IMPLANT

## 2018-12-13 NOTE — Anesthesia Preprocedure Evaluation (Signed)
Anesthesia Evaluation  Patient identified by MRN, date of birth, ID band Patient awake    Reviewed: Allergy & Precautions, NPO status , Patient's Chart, lab work & pertinent test results  Airway Mallampati: I  TM Distance: >3 FB Neck ROM: Full    Dental no notable dental hx. (+) Teeth Intact   Pulmonary Current Smoker,    Pulmonary exam normal breath sounds clear to auscultation       Cardiovascular negative cardio ROS Normal cardiovascular exam Rhythm:Regular Rate:Normal     Neuro/Psych PSYCHIATRIC DISORDERS ADHDnegative neurological ROS     GI/Hepatic negative GI ROS, Neg liver ROS, GERD  Medicated,  Endo/Other  Hyperhidrosis  Renal/GU negative Renal ROS  negative genitourinary   Musculoskeletal Abscess left foot    Abdominal   Peds  Hematology  (+) anemia ,   Anesthesia Other Findings   Reproductive/Obstetrics                             Anesthesia Physical Anesthesia Plan  ASA: II  Anesthesia Plan: General   Post-op Pain Management:    Induction: Intravenous  PONV Risk Score and Plan: Midazolam, Ondansetron and Treatment may vary due to age or medical condition  Airway Management Planned: LMA  Additional Equipment:   Intra-op Plan:   Post-operative Plan: Extubation in OR  Informed Consent: I have reviewed the patients History and Physical, chart, labs and discussed the procedure including the risks, benefits and alternatives for the proposed anesthesia with the patient or authorized representative who has indicated his/her understanding and acceptance.     Dental advisory given  Plan Discussed with: CRNA and Surgeon  Anesthesia Plan Comments:         Anesthesia Quick Evaluation

## 2018-12-13 NOTE — Op Note (Signed)
12/13/2018  1:58 PM  PATIENT:  Derek Jennings    PRE-OPERATIVE DIAGNOSIS:  Abscess Left Foot  POST-OPERATIVE DIAGNOSIS:  Same  PROCEDURE:  LEFT FOOT DEBRIDEMENT, With excision skin and soft tissue fascia muscle and tendon. AND SPLIT THICKNESS SKIN GRAFT using Integra 125 cm, and ACell powder 500 mg Application Of Wound Vac cleanse choice sponges. Local tissue rearrangement for wound closure 11 x 6 cm.   SURGEON:  [name], MD  PHYSICIAN ASSISTANT:None ANESTHESIA:   General  PREOPERATIVE INDICATIONS:  Derek Jennings is a  33 y.o. male with a diagnosis of Abscess Left Foot who failed conservative measures and elected for surgical management.    The risks benefits and alternatives were discussed with the patient preoperatively including but not limited to the risks of infection, bleeding, nerve injury, cardiopulmonary complications, the need for revision surgery, among others, and the patient was willing to proceed.  OPERATIVE IMPLANTS: Integra bovine prenatal skin graft 125 cm with cleanse choice wound VAC and 500 mg ACell powder  @ENCIMAGES @  OPERATIVE FINDINGS: After debridement the wound edges were healthy and viable the wound bed has healthy granulation tissue.  OPERATIVE PROCEDURE: Patient was brought the operating room and underwent a general anesthetic.  After adequate levels anesthesia were obtained patient's left lower extremity was prepped using Betadine paint and draped into a sterile field a timeout was called.  Elliptical incision was made around the previous ulcerative tissue the skin was sharply excised with a 21 blade knife.  The wound was 11 x 6 cm after debridement.  There was necrotic extensor tendon in the wound bed this was excised using a rondure..  There is no purulence there is good granulation tissue the wound bed.  After debridement of 2 extensor tendons with excision of these tendons patient had 100% healthy granulating bed.  The ACell powder was applied to  the granulation tissue this was covered with the Integra collagen graft.  This was sutured in place with 2-0 nylon and there was partial wound closure using the 2-0 nylon of the wound 11 x 6 cm.  The cleanse choice wound VAC was then applied this had a good suction fit.  Patient was extubated taken the PACU in stable condition.   DISCHARGE PLANNING:  Antibiotic duration: Continue antibiotics for 48 hours postoperatively.  Weightbearing: Physical therapy nonweightbearing on the left with a fracture boot in place  Pain medication: Opioid pathway ordered  Dressing care/ Wound VAC: Discharged with the portable Praveena wound VAC pump  Ambulatory devices: Walker or crutches  Discharge to: Anticipate discharge to home  Follow-up: In the office 1 week post operative.

## 2018-12-13 NOTE — Anesthesia Postprocedure Evaluation (Signed)
Anesthesia Post Note  Patient: Derek Jennings) Performed: LEFT FOOT DEBRIDEMENT AND SPLIT THICKNESS SKIN GRAFT (Left Foot) Application Of Wound Vac (Left Foot)     Patient location during evaluation: PACU Anesthesia Type: General Level of consciousness: awake and alert and oriented Pain management: pain level controlled Vital Signs Assessment: post-procedure vital signs reviewed and stable Respiratory status: spontaneous breathing, nonlabored ventilation and respiratory function stable Cardiovascular status: blood pressure returned to baseline and stable Postop Assessment: no apparent nausea or vomiting Anesthetic complications: no    Last Vitals:  Vitals:   12/13/18 1415 12/13/18 1420  BP:  (!) 157/99  Pulse: 73 62  Resp: 13 18  Temp:    SpO2: 100% 100%    Last Pain:  Vitals:   12/13/18 1420  TempSrc:   PainSc: 8                  Derek Jennings A.

## 2018-12-13 NOTE — Progress Notes (Signed)
Report received from [place], [place] [address]. Per [employer], pt did not sign consent until he talks to the Careers adviser.

## 2018-12-13 NOTE — TOC Progression Note (Signed)
Transition of Care [facility]) - Progression Note    Patient Details  Name: Derek Jennings MRN: [id number] Date of Birth: 06-Feb-1986  [facility]) CM/SW Contact  Nadene Rubins Adria Devon, RN Phone Number: 12/13/2018, 2:50 PM  Clinical Narrative:    Discharged with the portable Praveena wound VAC pump  Await PT recommendations and ID recommendations.   Expected Discharge Plan: Home/Self Care Barriers to Discharge: Continued Medical Work up  Expected Discharge Plan and Services Expected Discharge Plan: Home/Self Care In-house Referral: Financial Counselor Discharge Planning Services: CM Consult, [facility]igent [facility]   Living arrangements for the past 2 months: Single Family Home                 DME Arranged: N/A DME Agency: NA       HH Arranged: NA HH Agency: NA         Social Determinants of Health (SDOH) Interventions    Readmission Risk Interventions No flowsheet data found.

## 2018-12-13 NOTE — Plan of Care (Signed)
  Problem: Education: Goal: Knowledge of General Education information will improve Description Including pain rating scale, medication(s)/side effects and non-pharmacologic comfort measures Outcome: Progressing   Problem: Health Behavior/Discharge Planning: Goal: Ability to manage health-related needs will improve Outcome: Progressing   Problem: Clinical Measurements: Goal: Ability to maintain clinical measurements within normal limits will improve Outcome: Progressing Goal: Will remain free from infection Outcome: Progressing Goal: Diagnostic test results will improve Outcome: Progressing   Problem: Activity: Goal: Risk for activity intolerance will decrease Outcome: Progressing   Problem: Nutrition: Goal: Adequate nutrition will be maintained Outcome: Progressing   Problem: Coping: Goal: Level of anxiety will decrease Outcome: Progressing   Problem: Elimination: Goal: Will not experience complications related to bowel motility Outcome: Progressing Goal: Will not experience complications related to urinary retention Outcome: Progressing   Problem: Pain Managment: Goal: General experience of comfort will improve Outcome: Progressing   Problem: Safety: Goal: Ability to remain free from injury will improve Outcome: Progressing   Problem: Skin Integrity: Goal: Risk for impaired skin integrity will decrease Outcome: Progressing   Problem: Skin Integrity: Goal: Demonstration of wound healing without infection will improve Outcome: Progressing

## 2018-12-13 NOTE — Progress Notes (Signed)
Report given to Koleen Nimrod, Charity fundraiser. Pt not in distress, transported to short stay.

## 2018-12-13 NOTE — Anesthesia Procedure Notes (Signed)
Procedure Name: LMA Insertion Date/Time: 12/13/2018 1:12 PM Performed by: Zeplin Aleshire T, CRNA Pre-anesthesia Checklist: Patient identified, Emergency Drugs available, Suction available and Patient being monitored Patient Re-evaluated:Patient Re-evaluated prior to induction Oxygen Delivery Method: Circle system utilized Preoxygenation: Pre-oxygenation with 100% oxygen Induction Type: IV induction LMA: LMA inserted LMA Size: 5.0 Number of attempts: 1 Airway Equipment and Method: Patient positioned with wedge pillow Placement Confirmation: positive ETCO2 and breath sounds checked- equal and bilateral Tube secured with: Tape Dental Injury: Teeth and Oropharynx as per pre-operative assessment

## 2018-12-13 NOTE — Progress Notes (Signed)
PROGRESS NOTE    Derek Jennings  WUX:[id number] DOB: [birth year] DOA: 12/10/2018 PCP: Elby Showers, MD    Brief Narrative: 33 year old with past medical history significant for polysubstance abuse, IVDU presented with left foot pain for almost a week.  Patient went to the urgent care and was a started on prednisone.  3 days prior to admission patient presented to [facility] ED and was a started on clindamycin and he continue to take the prednisone.  He noticed that his foot was getting worse, a 1 burst open, draining and turning black in color.  He has not been using drugs for the last few weeks.  Patient was seen by orthopedic, Dr. Veverly Fells who recommended  I and D.  Patient went to the OR on 5/10 for incision and drainage.  A wound VAC was placed.  Patient was transferred to [facility] and was evaluated by Dr. Sharol Given for possible repeat procedure/surgical options.   Assessment & Plan:   Principal Problem:   Cellulitis and abscess of foot Active Problems:   Sepsis ([place])   Wound infection   Hyperglycemia   Normocytic anemia   Moderate protein-calorie malnutrition (HCC)   Polysubstance abuse ([place])  1-Cellulitis and abscess of left foot; -Patient admitted for IV antibiotics.  He was evaluated by orthopedic and underwent incision and drainage of left foot abscess, on 5/10 and again on 5/12. -Per Dr. Sharol Given note today patient may require split-thickness skin graft.  He require long-term antibiotics depending on tissue viability at repeat surgery. -Continue with IV vancomycin, ceftriaxone and Flagyl. -MRSA PCR negative.  Blood cultures no growth today. Wound culture growing gram-negative rods, and gram-positive cocci.  Follow final report.  2-sepsis due to #1. Patient met sepsis criteria at the time of admission with elevated white blood cell, tachypnea, source of infection left foot abscess. Blood cultures no growth today.  Continue with IV fluids IV antibiotics.  3-hyperglycemia Hb A1c  5.5  4-normocytic anemia: Monitor 5-moderate protein calorie malnutrition: Nutrition consulted. 6-Polysubstance abuse: Patient encouraged to stay clean.  UDS was positive for opioid.    Nutrition Problem: Increased nutrient needs Etiology: wound healing    Signs/Symptoms: estimated needs    Interventions: Ensure Enlive (each supplement provides 350kcal and 20 grams of protein), MVI  Estimated body mass index is 26.45 kg/m as calculated from the following:   Height as of this encounter: 6' (1.829 m).   Weight as of this encounter: 88.5 kg.   DVT prophylaxis: Lovenox Code Status: Full code Family Communication: Care discussed with patient Disposition Plan: In the hospital for IV antibiotics, patient will undergo another IND today.   Consultants:   Dr. Sharol Given   Procedures:   5-10 I and D, wound vac placement.  5-12repeat I&D and possible split thickness skin graft   Antimicrobials:   Vancomycin 5-10  Ceftriaxone 5-10  Flagyl 5-10   Subjective: Patient seen prior to surgery, he report pain is controlled.  He still thinks that his left foot is very edematous  Objective: Vitals:   12/12/18 0815 12/12/18 1315 12/12/18 2028 12/13/18 0408  BP: 116/66 123/76 135/82 124/66  Pulse: 76 76 80 78  Resp: 16 17    Temp: 98.5 F (36.9 C) 98.5 F (36.9 C) 98.9 F (37.2 C) 98.8 F (37.1 C)  TempSrc: Oral Oral Oral Oral  SpO2: 99% 100% 99% 98%  Weight:      Height:        Intake/Output Summary (Last 24 hours) at 12/13/2018 1103 Last  data filed at 12/13/2018 1033 Gross per 24 hour  Intake 2547.7 ml  Output 3580 ml  Net -1032.3 ml   Filed Weights   12/10/18 2013 12/11/18 0240 12/11/18 1537  Weight: 83.9 kg 83.9 kg 88.5 kg    Examination:  General exam: Appears calm and comfortable  Respiratory system: Clear to auscultation. Respiratory effort normal. Cardiovascular system: S1 & S2 heard, RRR. No JVD, murmurs, rubs, gallops or clicks. No pedal edema.  Gastrointestinal system: Abdomen is nondistended, soft and nontender. No organomegaly or masses felt. Normal bowel sounds heard. Central nervous system: Alert and oriented. No focal neurological deficits. Extremities: Symmetric 5 x 5 power. Skin: Left foot with edema, redness wound VAC in place Psychiatry: Judgement and insight appear normal. Mood & affect appropriate.     Data Reviewed: I have personally reviewed following labs and imaging studies  CBC: Recent Labs  Lab 12/10/18 2046 12/11/18 0422 12/12/18 0717 12/13/18 0120  WBC 16.3* 14.2* 9.6 8.3  NEUTROABS 12.0*  --   --   --   HGB 13.4 12.2* 11.7* 11.9*  HCT 39.9 37.2* 35.4* 35.1*  MCV 92.4 93.7 91.0 89.3  PLT 380 393 418* 010*   Basic Metabolic Panel: Recent Labs  Lab 12/10/18 2046 12/11/18 0422 12/12/18 0717 12/13/18 0120  NA 136 138 138 136  K 3.7 3.8 4.3 4.4  CL 103 104 98 98  CO2 '24 25 28 25  '$ GLUCOSE 111* 118* 96 98  BUN '19 13 7 10  '$ CREATININE 0.81 0.68 0.86 0.78  CALCIUM 8.3* 7.9* 8.6* 8.7*  MG  --  2.0  --   --   PHOS  --  3.3  --   --    GFR: Estimated Creatinine Clearance: 148.2 mL/min (by C-G formula based on SCr of 0.78 mg/dL). Liver Function Tests: Recent Labs  Lab 12/10/18 2046 12/11/18 0422  AST 17 14*  ALT 32 25  ALKPHOS 81 77  BILITOT 0.6 0.5  PROT 6.8 6.1*  ALBUMIN 3.0* 2.6*   No results for input(s): LIPASE, AMYLASE in the last 168 hours. No results for input(s): AMMONIA in the last 168 hours. Coagulation Profile: Recent Labs  Lab 12/11/18 0422  INR 1.2   Cardiac Enzymes: No results for input(s): CKTOTAL, CKMB, CKMBINDEX, TROPONINI in the last 168 hours. BNP (last 3 results) No results for input(s): PROBNP in the last 8760 hours. HbA1C: Recent Labs    12/11/18 0422  HGBA1C 5.5   CBG: No results for input(s): GLUCAP in the last 168 hours. Lipid Profile: No results for input(s): CHOL, HDL, LDLCALC, TRIG, CHOLHDL, LDLDIRECT in the last 72 hours. Thyroid Function  Tests: Recent Labs    12/11/18 0422  TSH 1.749   Anemia Panel: No results for input(s): VITAMINB12, FOLATE, FERRITIN, TIBC, IRON, RETICCTPCT in the last 72 hours. Sepsis Labs: Recent Labs  Lab 12/10/18 2046 12/10/18 2246 12/11/18 0422  PROCALCITON  --   --  0.11  LATICACIDVEN 0.9 0.6  --     Recent Results (from the past 240 hour(s))  Blood culture (routine x 2)     Status: None   Collection Time: 12/08/18  5:31 PM  Result Value Ref Range Status   Specimen Description   Final    BLOOD RIGHT FOREARM Performed at [facility], [place] [address], [place], [place] [address]    Special Requests   Final    BOTTLES DRAWN AEROBIC AND ANAEROBIC Blood Culture results may not be optimal due to an inadequate  volume of blood received in culture bottles Performed at [place] [address], [place], [place] [address]    Culture   Final    NO GROWTH 5 DAYS Performed at [facility], [place] [address], [place], [place] [address]    Report Status 12/13/2018 FINAL  Final  Blood culture (routine x 2)     Status: None   Collection Time: 12/08/18  5:38 PM  Result Value Ref Range Status   Specimen Description   Final    BLOOD LEFT ANTECUBITAL Performed at [facility] [address] [place], [place] [address]    Special Requests   Final    BOTTLES DRAWN AEROBIC AND ANAEROBIC Blood Culture adequate volume Performed at [facility] [address] [place], [place] [address]    Culture   Final    NO GROWTH 5 DAYS Performed at [facility], [place] [address] [place], [place] [address]    Report Status 12/13/2018 FINAL  Final  Blood culture (routine x 2)     Status: None (Preliminary result)   Collection Time: 12/10/18  8:46 PM  Result Value Ref Range Status   Specimen Description   Final    BLOOD RIGHT HAND Performed at [facility] [address], [place], [place] [address]    Special  Requests   Final    BOTTLES DRAWN AEROBIC AND ANAEROBIC Blood Culture results may not be optimal due to an excessive volume of blood received in culture bottles Performed at [facility] [address], [place], [place] [address]    Culture   Final    NO GROWTH 2 DAYS Performed at [facility] [address], [place], [place] [address]    Report Status PENDING  Incomplete  Blood culture (routine x 2)     Status: None (Preliminary result)   Collection Time: 12/10/18  8:51 PM  Result Value Ref Range Status   Specimen Description   Final    BLOOD LEFT ANTECUBITAL Performed at [facility] [address], [place], [place] [address]    Special Requests   Final    BOTTLES DRAWN AEROBIC AND ANAEROBIC Blood Culture adequate volume Performed at [facility] [address] [place], [place] [address]    Culture   Final    NO GROWTH 2 DAYS Performed at [facility] [address] [place], [place] [address]    Report Status PENDING  Incomplete  SARS Coronavirus 2 [facility] order, Performed in [place] [facility] hospital lab)     Status: None   Collection Time: 12/10/18 11:00 PM  Result Value Ref Range Status   SARS Coronavirus 2 NEGATIVE NEGATIVE Final    Comment: (NOTE) If result is NEGATIVE SARS-CoV-2 target nucleic acids are NOT DETECTED. The SARS-CoV-2 RNA is generally detectable in upper and lower  respiratory specimens during the acute phase of infection. The lowest  concentration of SARS-CoV-2 viral copies this assay can detect is 250  copies / mL. A negative result does not preclude SARS-CoV-2 infection  and should not be used as the sole basis for treatment or other  patient management decisions.  A negative result may occur with  improper specimen collection / handling, submission of specimen other  than nasopharyngeal swab, presence of viral mutation(s) within the  areas targeted by this assay, and inadequate  number of viral copies  (<250 copies / mL). A  negative result must be combined with clinical  observations, patient history, and epidemiological information. If result is POSITIVE SARS-CoV-2 target nucleic acids are DETECTED. The SARS-CoV-2 RNA is generally detectable in upper and lower  respiratory specimens dur ing the acute phase of infection.  Positive  results are indicative of active infection with SARS-CoV-2.  Clinical  correlation with patient history and other diagnostic information is  necessary to determine patient infection status.  Positive results do  not rule out bacterial infection or co-infection with other viruses. If result is PRESUMPTIVE POSTIVE SARS-CoV-2 nucleic acids MAY BE PRESENT.   A presumptive positive result was obtained on the submitted specimen  and confirmed on repeat testing.  While 2019 novel coronavirus  (SARS-CoV-2) nucleic acids may be present in the submitted sample  additional confirmatory testing may be necessary for epidemiological  and / or clinical management purposes  to differentiate between  SARS-CoV-2 and other Sarbecovirus currently known to infect humans.  If clinically indicated additional testing with an alternate test  methodology [phone]) is advised. The SARS-CoV-2 RNA is generally  detectable in upper and lower respiratory sp ecimens during the acute  phase of infection. The expected result is Negative. Fact Sheet for Patients:  StrictlyIdeas.no Fact Sheet for Healthcare Providers: BankingDealers.co.za This test is not yet approved or cleared by the [place] FDA and has been authorized for detection and/or diagnosis of SARS-CoV-2 by FDA under an Emergency Use Authorization (EUA).  This EUA will remain in effect (meaning this test can be used) for the duration of the COVID-19 declaration under Section 564(b)(1) of the Act, 21 U.S.C. section 360bbb-3(b)(1), unless the  authorization is terminated or revoked sooner. Performed at [facility], [place] [address], [place], [place] [address]   Aerobic/Anaerobic Culture (surgical/deep wound)     Status: None (Preliminary result)   Collection Time: 12/11/18  1:08 AM  Result Value Ref Range Status   Specimen Description   Final    WOUND LEFT FOOT Performed at [facility], [place] [address] [place], [place] [address]    Special Requests   Final    NONE Performed at [facility], [place] [address], [place], [place] [address]    Gram Stain   Final    RARE WBC PRESENT, PREDOMINANTLY PMN FEW GRAM NEGATIVE RODS RARE GRAM POSITIVE COCCI IN CLUSTERS    Culture   Final    CULTURE REINCUBATED FOR BETTER GROWTH Performed at [facility], [place] [address], [place], [place] [address]    Report Status PENDING  Incomplete  MRSA PCR Screening     Status: Abnormal   Collection Time: 12/11/18 12:15 PM  Result Value Ref Range Status   MRSA by PCR POSITIVE (A) NEGATIVE Final    Comment:        The GeneXpert MRSA Assay (FDA approved for NASAL specimens only), is one component of a comprehensive MRSA colonization surveillance program. It is not intended to diagnose MRSA infection nor to guide or monitor treatment for MRSA infections. RESULT CALLED TO, READ BACK BY AND VERIFIED WITH: C GRIFFITH,RN 12/11/18 1434 RHOLMES Performed at [facility], [place] [address] [place], [place] [address]          Radiology Studies: Vas [place] Abi With/wo Tbi  Result Date: 12/13/2018 LOWER EXTREMITY DOPPLER STUDY Indications: Left foot wound.  Performing Technologist: June Leap RDMS, RVT  Examination Guidelines: A complete evaluation includes at minimum, Doppler waveform signals and systolic blood pressure reading  at the level of bilateral brachial, anterior tibial, and posterior tibial arteries, when vessel segments are accessible. Bilateral testing is considered an  integral part of a complete examination. Photoelectric Plethysmograph (PPG) waveforms and toe systolic pressure readings are included as required and additional duplex testing as needed. Limited examinations for reoccurring indications may be performed as noted.  ABI Findings: +---------+------------------+-----+---------+--------+ Right    Rt Pressure (mmHg)IndexWaveform Comment  +---------+------------------+-----+---------+--------+ Brachial 160                    triphasic         +---------+------------------+-----+---------+--------+ ATA      163               1.02 triphasic         +---------+------------------+-----+---------+--------+ PTA      155               0.97 triphasic         +---------+------------------+-----+---------+--------+ Great Toe153               0.96 Normal            +---------+------------------+-----+---------+--------+ +---------+------------------+-----+---------+-------+ Left     Lt Pressure (mmHg)IndexWaveform Comment +---------+------------------+-----+---------+-------+ Brachial 152                    triphasic        +---------+------------------+-----+---------+-------+ PTA                             triphasic        +---------+------------------+-----+---------+-------+ Great Toe139               0.87 Normal           +---------+------------------+-----+---------+-------+ Unable to obtain ATA waveform or pressure due to patient pain intolerance at that area. Unable to obtain PTA pressure due to patient pain intolerance of cuff. PTA waveform appears normal.  Summary: Right: Resting right ankle-brachial index is within normal range. No evidence of significant right lower extremity arterial disease. The right toe-brachial index is normal. Left: The left toe-brachial index is normal. See notes above- unable to obtain ABI.  *See table(s) above for measurements and observations.  Electronically signed by Ruta Hinds MD on  12/13/2018 at 9:44:17 AM.   Final         Scheduled Meds: . chlorhexidine  60 mL Topical Once  . Chlorhexidine Gluconate Cloth  6 each Topical Q0600  . docusate sodium  100 mg Oral BID  . enoxaparin (LOVENOX) injection  40 mg Subcutaneous Q24H  . feeding supplement (ENSURE ENLIVE)  237 mL Oral BID BM  . multivitamin with minerals  1 tablet Oral Daily  . mupirocin ointment  1 application Nasal BID  . pneumococcal 23 valent vaccine  0.5 mL Intramuscular Tomorrow-1000   Continuous Infusions: . cefTRIAXone (ROCEPHIN)  IV 2 g (12/13/18 0119)  . clindamycin (CLEOCIN) IV    . metronidazole 500 mg (12/13/18 0930)  . vancomycin 1,250 mg (12/13/18 0532)     LOS: 3 days    Time spent: 35 minutes.     Elmarie Shiley, MD [facility] Pager [phone]  If 7PM-7AM, please contact night-coverage [email or web address] Password [facility] 12/13/2018, 11:03 AM

## 2018-12-13 NOTE — Plan of Care (Signed)
  Problem: Education: Goal: Knowledge of General Education information will improve Description Including pain rating scale, medication(s)/side effects and non-pharmacologic comfort measures Outcome: Progressing   Problem: Health Behavior/Discharge Planning: Goal: Ability to manage health-related needs will improve Outcome: Progressing   Problem: Clinical Measurements: Goal: Ability to maintain clinical measurements within normal limits will improve Outcome: Progressing Goal: Will remain free from infection Outcome: Progressing   Problem: Activity: Goal: Risk for activity intolerance will decrease Outcome: Progressing   Problem: Nutrition: Goal: Adequate nutrition will be maintained Outcome: Progressing   Problem: Coping: Goal: Level of anxiety will decrease Outcome: Progressing   Problem: Elimination: Goal: Will not experience complications related to bowel motility Outcome: Progressing Goal: Will not experience complications related to urinary retention Outcome: Progressing   Problem: Pain Managment: Goal: General experience of comfort will improve Outcome: Progressing   Problem: Safety: Goal: Ability to remain free from injury will improve Outcome: Progressing   Problem: Skin Integrity: Goal: Risk for impaired skin integrity will decrease Outcome: Progressing   Problem: Clinical Measurements: Goal: Ability to avoid or minimize complications of infection will improve Outcome: Progressing   Problem: Skin Integrity: Goal: Skin integrity will improve Outcome: Progressing   Problem: Skin Integrity: Goal: Demonstration of wound healing without infection will improve Outcome: Progressing

## 2018-12-13 NOTE — Transfer of Care (Signed)
Immediate Anesthesia Transfer of Care Note  Patient: Derek Jennings  Procedure(s) Performed: LEFT FOOT DEBRIDEMENT AND SPLIT THICKNESS SKIN GRAFT (Left Foot) Application Of Wound Vac (Left Foot)  Patient Location: PACU  Anesthesia Type:General  Level of Consciousness: awake, alert  and oriented  Airway & Oxygen Therapy: Patient Spontanous Breathing and Patient connected to nasal cannula oxygen  Post-op Assessment: Report given to RN, Post -op Vital signs reviewed and stable and Patient moving all extremities  Post vital signs: Reviewed and stable  Last Vitals:  Vitals Value Taken Time  BP 153/92 12/13/2018  1:50 PM  Temp    Pulse 66 12/13/2018  1:53 PM  Resp 19 12/13/2018  1:53 PM  SpO2 100 % 12/13/2018  1:53 PM  Vitals shown include unvalidated device data.  Last Pain:  Vitals:   12/13/18 1033  TempSrc:   PainSc: 0-No pain      Patients Stated Pain Goal: 3 (12/12/18 1029)  Complications: No apparent anesthesia complications

## 2018-12-13 NOTE — Progress Notes (Signed)
Patient would like to ask Dr. Lajoyce Corners some questions before signing the consent for surgery. The patient stated he would comply with the ERAS protocol diet before surgery.

## 2018-12-13 NOTE — Interval H&P Note (Signed)
History and Physical Interval Note:  12/13/2018 6:39 AM  Derek Jennings  has presented today for surgery, with the diagnosis of Abscess Left Foot.  The various methods of treatment have been discussed with the patient and family. After consideration of risks, benefits and other options for treatment, the patient has consented to  Procedure(s): LEFT FOOT DEBRIDEMENT AND POSSIBLE SPLIT THICKNESS SKIN GRAFT (Left) as a surgical intervention.  The patient's history has been reviewed, patient examined, no change in status, stable for surgery.  I have reviewed the patient's chart and labs.  Questions were answered to the patient's satisfaction.     [name]

## 2018-12-13 NOTE — Plan of Care (Signed)
  Problem: Clinical Measurements: Goal: Ability to maintain clinical measurements within normal limits will improve Outcome: Progressing   Problem: Elimination: Goal: Will not experience complications related to bowel motility Outcome: Progressing   Problem: Pain Managment: Goal: General experience of comfort will improve Outcome: Progressing   Problem: Safety: Goal: Ability to remain free from injury will improve Outcome: Progressing   Problem: Skin Integrity: Goal: Risk for impaired skin integrity will decrease Outcome: Progressing   Problem: Clinical Measurements: Goal: Ability to avoid or minimize complications of infection will improve Outcome: Progressing

## 2018-12-13 NOTE — Progress Notes (Signed)
Orthopedic Tech Progress Note Patient Details:  Derek Jennings [birth year] [id number]  Ortho Devices Type of Ortho Device: CAM walker Ortho Device/Splint Location: left Ortho Device/Splint Interventions: Application   Post Interventions Patient Tolerated: Well Instructions Provided: Care of device   Saul Fordyce 12/13/2018, 3:51 PM

## 2018-12-14 ENCOUNTER — Encounter (HOSPITAL_COMMUNITY): Payer: Self-pay | Admitting: Orthopedic Surgery

## 2018-12-14 LAB — CBC
HCT: 36 % — ABNORMAL LOW (ref 39.0–52.0)
Hemoglobin: 12 g/dL — ABNORMAL LOW (ref 13.0–17.0)
MCH: 30 pg (ref 26.0–34.0)
MCHC: 33.3 g/dL (ref 30.0–36.0)
MCV: 90 fL (ref 80.0–100.0)
Platelets: 456 10*3/uL — ABNORMAL HIGH (ref 150–400)
RBC: 4 MIL/uL — ABNORMAL LOW (ref 4.22–5.81)
RDW: 12.9 % (ref 11.5–15.5)
WBC: 10.7 10*3/uL — ABNORMAL HIGH (ref 4.0–10.5)
nRBC: 0 % (ref 0.0–0.2)

## 2018-12-14 LAB — BASIC METABOLIC PANEL
Anion gap: 12 (ref 5–15)
BUN: 14 mg/dL (ref 6–20)
CO2: 23 mmol/L (ref 22–32)
Calcium: 8.7 mg/dL — ABNORMAL LOW (ref 8.9–10.3)
Chloride: 100 mmol/L (ref 98–111)
Creatinine, Ser: 1.09 mg/dL (ref 0.61–1.24)
GFR calc Af Amer: >60 mL/min (ref >60)
GFR calc non Af Amer: >60 mL/min (ref >60)
Glucose, Bld: 98 mg/dL (ref 70–99)
Potassium: 4.5 mmol/L (ref 3.5–5.1)
Sodium: 135 mmol/L (ref 135–145)

## 2018-12-14 MED ORDER — ACETAMINOPHEN 325 MG PO TABS: 325.0000 mg | ORAL_TABLET | Freq: Four times a day (QID) | ORAL | Status: AC | PRN

## 2018-12-14 MED ORDER — DOXYCYCLINE HYCLATE 100 MG PO TABS: 100.0000 mg | ORAL_TABLET | Freq: Two times a day (BID) | ORAL | 0 refills | Status: AC

## 2018-12-14 MED ORDER — OXYCODONE HCL 5 MG PO TABS: 5.0000 mg | ORAL_TABLET | Freq: Four times a day (QID) | ORAL | 0 refills | Status: AC | PRN

## 2018-12-14 MED FILL — oxyCODONE HCL 5 MG TABS: 5 | 5 days supply | Qty: 20 | Fill #0

## 2018-12-14 MED FILL — DOXYCYCLINE HYCLATE 100 MG: 100 | 7 days supply | Qty: 14 | Fill #0

## 2018-12-14 NOTE — Care Management (Signed)
Ordered rolling walker and 3 in 1. Called Zack with [employer].   Ronny Flurry RN BSN [phone]

## 2018-12-14 NOTE — Evaluation (Signed)
Physical Therapy Evaluation Patient Details Name: Derek Jennings MRN: [id number] DOB: 1985/10/10 Today's Date: 12/14/2018   History of Present Illness  Pt is a 33 y.o. male admitted with L foot abscess, s/p L foot I&D, skin graft and wound vac application on 12/13/18. PMH includes polysubstance abuse.    Clinical Impression  Pt presents with an overall decrease in functional mobility secondary to above. PTA, pt indep and will be staying with mother. Educ on precautions, positioning, portable wound vac use, CAM walker boot use, therex, and importance of mobility. Today, pt able to initiate transfer and gait training with RW; good ability to maintain LLE NWB with mobility. Pt prefers stability of RW over crutches. Pt would benefit from continued acute PT services to maximize functional mobility and independence prior to d/c home. Will need to be able to ascend flight of steps before pt can d/c home safely.     Follow Up Recommendations No PT follow up;Supervision - Intermittent    Equipment Recommendations  Rolling walker with 5" wheels;3in1 (PT)    Recommendations for Other Services       Precautions / Restrictions Precautions Precautions: Fall Restrictions Weight Bearing Restrictions: Yes LLE Weight Bearing: Non weight bearing Other Position/Activity Restrictions: CAM walker boot      Mobility  Bed Mobility Overal bed mobility: Independent                Transfers Overall transfer level: Needs assistance Equipment used: Rolling walker (2 wheeled) Transfers: Sit to/from Stand Sit to Stand: Supervision         General transfer comment: Cues for technique/hand placement; supervision for safety, reliant on momentum  Ambulation/Gait Ambulation/Gait assistance: Supervision;Min guard Gait Distance (Feet): 100 Feet Assistive device: Rolling walker (2 wheeled)   Gait velocity: Decreased   General Gait Details: Good ability to utilize hop-to gait pattern on RLE with RW  in order to maintain LLE NWB; increased need to rest L foot on ground during standing rest breaks with fatigue. 1x self-corrected instability  Stairs            Wheelchair Mobility    Modified Rankin (Stroke Patients Only)       Balance Overall balance assessment: Needs assistance   Sitting balance-Leahy Scale: Good Sitting balance - Comments: Indep to doff cam walker boot while seated     Standing balance-Leahy Scale: Fair Standing balance comment: Can static stand without UE support                             Pertinent Vitals/Pain Pain Assessment: Faces Faces Pain Scale: Hurts little more Pain Location: L foot Pain Descriptors / Indicators: Guarding;Sore Pain Intervention(s): Limited activity within patient's tolerance    Home Living Family/patient expects to be discharged to:: Private residence Living Arrangements: Parent Available Help at Discharge: Family;Available 24 hours/day Type of Home: House Home Access: Stairs to enter Entrance Stairs-Rails: None Entrance Stairs-Number of Steps: 1 Home Layout: Two level;Bed/bath upstairs Home Equipment: Crutches      Prior Function Level of Independence: Independent         Comments: Was working at [employer] before [employer]. Enjoys Therapist, musicgolfing     Hand Dominance        Extremity/Trunk Assessment   Upper Extremity Assessment Upper Extremity Assessment: Overall WFL for tasks assessed    Lower Extremity Assessment Lower Extremity Assessment: LLE deficits/detail LLE Deficits / Details: s/p L ankle I&D; hip[facility]knee strength & ROM [facility]  Communication   Communication: No difficulties  Cognition Arousal/Alertness: Awake/alert Behavior During Therapy: WFL for tasks assessed/performed Overall Cognitive Status: Within Functional Limits for tasks assessed                                        General Comments General comments (skin integrity, edema, etc.): Began educ about portable  wound vac pt will d/c home with    Exercises Other Exercises Other Exercises: [employer] HEP handout provided: SLR, hip abd, LAQ, seated marching   Assessment/Plan    PT Assessment Patient needs continued PT services  PT Problem List Decreased activity tolerance;Decreased balance;Decreased mobility;Decreased knowledge of use of DME;Decreased knowledge of precautions;Pain       PT Treatment Interventions DME instruction;Gait training;Stair training;Functional mobility training;Therapeutic activities;Therapeutic exercise;Balance training;Patient/family education    PT Goals (Current goals can be found in the Care Plan section)  Acute Rehab PT Goals Patient Stated Goal: Return home PT Goal Formulation: With patient Time For Goal Achievement: 12/28/18 Potential to Achieve Goals: Good    Frequency Min 5X/week   Barriers to discharge        Co-evaluation               AM-PAC PT "6 Clicks" Mobility  Outcome Measure Help needed turning from your back to your side while in a flat bed without using bedrails?: None Help needed moving from lying on your back to sitting on the side of a flat bed without using bedrails?: None Help needed moving to and from a bed to a chair (including a wheelchair)?: A Little Help needed standing up from a chair using your arms (e.g., wheelchair or bedside chair)?: A Little Help needed to walk in hospital room?: A Little Help needed climbing 3-5 steps with a railing? : A Little 6 Click Score: 20    End of Session   Activity Tolerance: Patient tolerated treatment well Patient left: in chair;with call bell/phone within reach Nurse Communication: Mobility status PT Visit Diagnosis: Other abnormalities of gait and mobility (R26.89)    Time: 4132-4401 PT Time Calculation (min) (ACUTE ONLY): 33 min   Charges:   PT Evaluation $PT Eval Low Complexity: 1 Low PT Treatments $Gait Training: 8-22 mins   Ina Homes, PT, DPT Acute Rehabilitation  Services  Pager [phone] Office [phone]  Malachy Chamber 12/14/2018, 4:09 PM

## 2018-12-14 NOTE — TOC Progression Note (Addendum)
Transition of Care [facility]) - Progression Note    Patient Details  Name: Derek Jennings MRN: [id number] Date of Birth: 03/17/1986  Transition of Care [facility]) CM/SW Contact  Prabhjot Maddux, Adria Devon, RN Phone Number: 12/14/2018, 1:42 PM  Clinical Narrative:     Possible discharge 12/15/18 on PO ABX. Entered in [facility] , pharmacy changed to [facility]. Plans to DC home with mother, awaiting PT eval . Preveena VAC  Prescriptions in [facility], entered in [facility] , No co pay   Follow up appointment [facility] at [facility] Dec 21, 2018 at 1030 am  Expected Discharge Plan: Home/Self Care Barriers to Discharge: Continued Medical Work up  Expected Discharge Plan and Services Expected Discharge Plan: Home/Self Care In-house Referral: Financial Counselor Discharge Planning Services: CM Consult, [facility]igent [facility]   Living arrangements for the past 2 months: Single Family Home                 DME Arranged: N/A DME Agency: NA       HH Arranged: NA HH Agency: NA         Social Determinants of Health (SDOH) Interventions    Readmission Risk Interventions No flowsheet data found.

## 2018-12-14 NOTE — Progress Notes (Signed)
PROGRESS NOTE    Derek Jennings  ZOX:[id number] DOB: 1986-02-26 DOA: 12/10/2018 PCP: Margaree Mackintosh, MD   Brief Narrative: 33 year old with past medical history significant for polysubstance abuse, IV methamphetamine use presented to the emergency room with pain swelling and redness in his left foot x1 week reportedly started on prednisone urgent care 3 days prior to admission, following this the swelling worsened, pustular area ruptured with purulent discharge and turned black. -Patient reported not using IV drugs for the last several weeks -Orthopedics was consulted, he underwent I&D on 5/10 wound VAC was placed -Went back to the OR again, underwent further debridement with placement of a wound VAC  Assessment & Plan:   1-left foot abscess with cellulitis  -Orthopedics consulted -Underwent I&D on 5/10 and repeat debridement yesterday 5/12 with wound VAC placement -Currently on IV vancomycin and ceftriaxone and Flagyl -Blood cultures are negative -Wound cultures few group F strep and gram-negative rods, patient to oral antibiotics at discharge, possibly tomorrow -Ambulate with PT, postop shoe, nonweightbearing left leg  2-sepsis due to #1. -Resolved  3-hyperglycemia Hb A1c 5.5  4-mild normocytic anemia -No further work-up indicated for now  5-moderate protein calorie malnutrition:  -Supplements per RD  6-Polysubstance abuse:/3 of methamphetamine use  -UDS was positive for opioids , patient declines heroin or other narcotic abuse  -Counseled   DVT prophylaxis: Lovenox Code Status-full code Family Communication-no family at bedside discussed patient Disposition Plan-Home tomorrow   Consultants:   Dr. Lajoyce Corners   Procedures:   5-10 I and D, wound vac placement.  5-12repeat I&D and possible split thickness skin graft   Antimicrobials:   Vancomycin 5-10  Ceftriaxone 5-10  Flagyl 5-10   Subjective: -Feels okay, swelling improving, reports feeling better overall,  wants to work with physical therapy and walk today  Objective: Vitals:   12/13/18 2006 12/14/18 0005 12/14/18 0433 12/14/18 1145  BP: 127/78 116/70 125/74 121/77  Pulse: 86 (!) 104 90 96  Resp:    18  Temp: 97.7 F (36.5 C) 98.6 F (37 C) 98.6 F (37 C) 98.5 F (36.9 C)  TempSrc: Oral Oral Oral Oral  SpO2: 97% 99% 97% 98%  Weight:      Height:        Intake/Output Summary (Last 24 hours) at 12/14/2018 1157 Last data filed at 12/14/2018 1144 Gross per 24 hour  Intake 1215.87 ml  Output 1725 ml  Net -509.13 ml   Filed Weights   12/10/18 2013 12/11/18 0240 12/11/18 1537  Weight: 83.9 kg 83.9 kg 88.5 kg    Examination: Gen: Awake, Alert, Oriented X 3, pleasant young male, no distress HEENT: PERRLA, Neck supple, no JVD Lungs: Good air movement bilaterally, CTAB CVS: RRR,No Gallops,Rubs or new Murmurs Abd: soft, Non tender, non distended, BS present Extremities: Left foot surgical incision noted, wound VAC  skin: no new rashes    Data Reviewed: I have personally reviewed following labs and imaging studies  CBC: Recent Labs  Lab 12/10/18 2046 12/11/18 0422 12/12/18 0717 12/13/18 0120 12/14/18 0340  WBC 16.3* 14.2* 9.6 8.3 10.7*  NEUTROABS 12.0*  --   --   --   --   HGB 13.4 12.2* 11.7* 11.9* 12.0*  HCT 39.9 37.2* 35.4* 35.1* 36.0*  MCV 92.4 93.7 91.0 89.3 90.0  PLT 380 393 418* 439* 456*   Basic Metabolic Panel: Recent Labs  Lab 12/10/18 2046 12/11/18 0422 12/12/18 0717 12/13/18 0120 12/14/18 0340  NA 136 138 138 136 135  K 3.7 3.8  4.3 4.4 4.5  CL 103 104 98 98 100  CO2 GLUCOSE 111* 118* 96 98 98  BUN CREATININE 0.81 0.68 0.86 0.78 1.09  CALCIUM 8.3* 7.9* 8.6* 8.7* 8.7*  MG  --  2.0  --   --   --   PHOS  --  3.3  --   --   --    GFR: Estimated Creatinine Clearance: 108.8 mL/min (by C-G formula based on SCr of 1.09 mg/dL). Liver Function Tests: Recent Labs  Lab 12/10/18 2046 12/11/18 0422  AST 17 14*  ALT 32 25   ALKPHOS 81 77  BILITOT 0.6 0.5  PROT 6.8 6.1*  ALBUMIN 3.0* 2.6*   No results for input(s): LIPASE, AMYLASE in the last 168 hours. No results for input(s): AMMONIA in the last 168 hours. Coagulation Profile: Recent Labs  Lab 12/11/18 0422  INR 1.2   Cardiac Enzymes: No results for input(s): CKTOTAL, CKMB, CKMBINDEX, TROPONINI in the last 168 hours. BNP (last 3 results) No results for input(s): PROBNP in the last 8760 hours. HbA1C: No results for input(s): HGBA1C in the last 72 hours. CBG: No results for input(s): GLUCAP in the last 168 hours. Lipid Profile: No results for input(s): CHOL, HDL, LDLCALC, TRIG, CHOLHDL, LDLDIRECT in the last 72 hours. Thyroid Function Tests: No results for input(s): TSH, T4TOTAL, FREET4, T3FREE, THYROIDAB in the last 72 hours. Anemia Panel: No results for input(s): VITAMINB12, FOLATE, FERRITIN, TIBC, IRON, RETICCTPCT in the last 72 hours. Sepsis Labs: Recent Labs  Lab 12/10/18 2046 12/10/18 2246 12/11/18 0422  PROCALCITON  --   --  0.11  LATICACIDVEN 0.9 0.6  --     Recent Results (from the past 240 hour(s))  Blood culture (routine x 2)     Status: None   Collection Time: 12/08/18  5:31 PM  Result Value Ref Range Status   Specimen Description   Final    BLOOD RIGHT FOREARM Performed at [facility], [address] [place], [place] [address]    Special Requests   Final    BOTTLES DRAWN AEROBIC AND ANAEROBIC Blood Culture results may not be optimal due to an inadequate volume of blood received in culture bottles Performed at [facility], [address], [place], [place] [address]    Culture   Final    NO GROWTH 5 DAYS Performed at [facility], [address] [place], [place] [address]    Report Status 12/13/2018 FINAL  Final  Blood culture (routine x 2)     Status: None   Collection Time: 12/08/18  5:38 PM  Result Value Ref Range Status   Specimen Description   Final    BLOOD LEFT ANTECUBITAL  Performed at [facility], [address] [place], [place] [address]    Special Requests   Final    BOTTLES DRAWN AEROBIC AND ANAEROBIC Blood Culture adequate volume Performed at [facility], [address] [place], [place] [address]    Culture   Final    NO GROWTH 5 DAYS Performed at [facility], [address], [place], [place] [address]    Report Status 12/13/2018 FINAL  Final  Blood culture (routine x 2)     Status: None (Preliminary result)   Collection Time: 12/10/18  8:46 PM  Result Value Ref Range Status   Specimen Description   Final    BLOOD  RIGHT HAND Performed at [facility] [place] [facility], [address] [place], [place] [address]    Special Requests   Final    BOTTLES DRAWN AEROBIC AND ANAEROBIC Blood Culture results may not be optimal due to an excessive volume of blood received in culture bottles Performed at [facility], [address] [place], [place] [address]    Culture   Final    NO GROWTH 2 DAYS Performed at [facility], [address] [place], [place] [address]    Report Status PENDING  Incomplete  Blood culture (routine x 2)     Status: None (Preliminary result)   Collection Time: 12/10/18  8:51 PM  Result Value Ref Range Status   Specimen Description   Final    BLOOD LEFT ANTECUBITAL Performed at [facility], [address] [place], [place] [address]    Special Requests   Final    BOTTLES DRAWN AEROBIC AND ANAEROBIC Blood Culture adequate volume Performed at [facility], [address], [place], [place] [address]    Culture   Final    NO GROWTH 2 DAYS Performed at [facility], [address], [place], [place] [address]    Report Status PENDING  Incomplete  SARS Coronavirus 2 [facility] order, Performed in [facility] hospital lab)     Status: None   Collection Time: 12/10/18 11:00 PM  Result Value Ref Range Status    SARS Coronavirus 2 NEGATIVE NEGATIVE Final    Comment: (NOTE) If result is NEGATIVE SARS-CoV-2 target nucleic acids are NOT DETECTED. The SARS-CoV-2 RNA is generally detectable in upper and lower  respiratory specimens during the acute phase of infection. The lowest  concentration of SARS-CoV-2 viral copies this assay can detect is 250  copies / mL. A negative result does not preclude SARS-CoV-2 infection  and should not be used as the sole basis for treatment or other  patient management decisions.  A negative result may occur with  improper specimen collection / handling, submission of specimen other  than nasopharyngeal swab, presence of viral mutation(s) within the  areas targeted by this assay, and inadequate number of viral copies  (<250 copies / mL). A negative result must be combined with clinical  observations, patient history, and epidemiological information. If result is POSITIVE SARS-CoV-2 target nucleic acids are DETECTED. The SARS-CoV-2 RNA is generally detectable in upper and lower  respiratory specimens dur ing the acute phase of infection.  Positive  results are indicative of active infection with SARS-CoV-2.  Clinical  correlation with patient history and other diagnostic information is  necessary to determine patient infection status.  Positive results do  not rule out bacterial infection or co-infection with other viruses. If result is PRESUMPTIVE POSTIVE SARS-CoV-2 nucleic acids MAY BE PRESENT.   A presumptive positive result was obtained on the submitted specimen  and confirmed on repeat testing.  While 2019 novel coronavirus  (SARS-CoV-2) nucleic acids may be present in the submitted sample  additional confirmatory testing may be necessary for epidemiological  and / or clinical management purposes  to differentiate between  SARS-CoV-2 and other Sarbecovirus currently known to infect humans.  If clinically indicated additional testing with an alternate test   methodology [phone]) is advised. The SARS-CoV-2 RNA is generally  detectable in upper and lower respiratory sp ecimens during the acute  phase of infection. The expected result is Negative. Fact Sheet for Patients:  BoilerBrush.com.cy Fact Sheet for Healthcare Providers:  https://pope.com/https://www.fda.gov/media/136313/download This test is not yet approved or cleared by the [place] FDA and has been authorized for detection and/or diagnosis of SARS-CoV-2 by FDA under an Emergency Use Authorization (EUA).  This EUA will remain in effect (meaning this test can be used) for the duration of the COVID-19 declaration under Section 564(b)(1) of the Act, 21 U.S.C. section 360bbb-3(b)(1), unless the authorization is terminated or revoked sooner. Performed at [facility], [address] [place], [place] [address]   Aerobic/Anaerobic Culture (surgical/deep wound)     Status: None (Preliminary result)   Collection Time: 12/11/18  1:08 AM  Result Value Ref Range Status   Specimen Description   Final    WOUND LEFT FOOT Performed at [facility], [address] [place], [place] [address]    Special Requests   Final    NONE Performed at [facility], [address] [place], [place] [address]    Gram Stain   Final    RARE WBC PRESENT, PREDOMINANTLY PMN FEW GRAM NEGATIVE RODS RARE GRAM POSITIVE COCCI IN CLUSTERS    Culture   Final    FEW STREPTOCOCCUS GROUP F CULTURE REINCUBATED FOR BETTER GROWTH Performed at [facility], [address] [place], [place] [address]    Report Status PENDING  Incomplete  MRSA PCR Screening     Status: Abnormal   Collection Time: 12/11/18 12:15 PM  Result Value Ref Range Status   MRSA by PCR POSITIVE (A) NEGATIVE Final    Comment:        The GeneXpert MRSA Assay (FDA approved for NASAL specimens only), is one component of a comprehensive MRSA colonization surveillance program. It is not  intended to diagnose MRSA infection nor to guide or monitor treatment for MRSA infections. RESULT CALLED TO, READ BACK BY AND VERIFIED WITH: C GRIFFITH,RN 12/11/18 1434 RHOLMES Performed at [facility], [address] [place], [place] [address]          Radiology Studies: No results found.      Scheduled Meds: . Chlorhexidine Gluconate Cloth  6 each Topical Q0600  . docusate sodium  100 mg Oral BID  . enoxaparin (LOVENOX) injection  40 mg Subcutaneous Q24H  . feeding supplement (ENSURE ENLIVE)  237 mL Oral BID BM  . multivitamin with minerals  1 tablet Oral Daily  . mupirocin ointment  1 application Nasal BID   Continuous Infusions: . sodium chloride Stopped (12/14/18 0012)  . cefTRIAXone (ROCEPHIN)  IV 2 g (12/14/18 0149)  . lactated ringers 10 mL/hr at 12/13/18 1129  . methocarbamol (ROBAXIN) IV    . metronidazole 500 mg (12/14/18 [id number])  . vancomycin 1,250 mg (12/14/18 0534)     LOS: 4 days    Time spent: 35 minutes.     Zannie CovePreetha Rex Oesterle, MD [facility]  12/14/2018, 11:57 AM

## 2018-12-15 LAB — CBC
HCT: 36.3 % — ABNORMAL LOW (ref 39.0–52.0)
Hemoglobin: 12.2 g/dL — ABNORMAL LOW (ref 13.0–17.0)
MCH: 30.3 pg (ref 26.0–34.0)
MCHC: 33.6 g/dL (ref 30.0–36.0)
MCV: 90.1 fL (ref 80.0–100.0)
Platelets: 474 10*3/uL — ABNORMAL HIGH (ref 150–400)
RBC: 4.03 MIL/uL — ABNORMAL LOW (ref 4.22–5.81)
RDW: 13 % (ref 11.5–15.5)
WBC: 12.4 10*3/uL — ABNORMAL HIGH (ref 4.0–10.5)
nRBC: 0 % (ref 0.0–0.2)

## 2018-12-15 LAB — BASIC METABOLIC PANEL
Anion gap: 12 (ref 5–15)
BUN: 12 mg/dL (ref 6–20)
CO2: 25 mmol/L (ref 22–32)
Calcium: 8.9 mg/dL (ref 8.9–10.3)
Chloride: 98 mmol/L (ref 98–111)
Creatinine, Ser: 0.93 mg/dL (ref 0.61–1.24)
GFR calc Af Amer: >60 mL/min (ref >60)
GFR calc non Af Amer: >60 mL/min (ref >60)
Glucose, Bld: 102 mg/dL — ABNORMAL HIGH (ref 70–99)
Potassium: 4.4 mmol/L (ref 3.5–5.1)
Sodium: 135 mmol/L (ref 135–145)

## 2018-12-15 NOTE — Discharge Summary (Signed)
Physician Discharge Summary  [facility] XBJ:[id number] DOB: 15-May-1986 DOA: 12/10/2018  PCP: Margaree Mackintosh, MD  Admit date: 12/10/2018 Discharge date: 12/15/2018  Time spent: 35 minutes  Recommendations for Outpatient Follow-up:  1. PCP at [facility] wellness clinic 2. Orthopedics Dr. Lajoyce Corners in 1 week   Discharge Diagnoses:  Principal Problem:   Cellulitis and abscess of foot Active Problems:   Sepsis (HCC)   Wound infection   Hyperglycemia   Normocytic anemia   Moderate protein-calorie malnutrition (HCC)   Polysubstance abuse [facility])   Discharge Condition: Stable  Diet recommendation: Regular  Filed Weights   12/10/18 2013 12/11/18 0240 12/11/18 1537  Weight: 83.9 kg 83.9 kg 88.5 kg    History of present illness:  33 year old with past medical history significant for polysubstance abuse, IV methamphetamine use presented to the emergency room with pain swelling and redness in his left foot x1 week reportedly started on prednisone urgent care 3 days prior to admission, following this the swelling worsened, pustular area ruptured with purulent discharge and turned black. -Patient reported not using IV drugs for the last several weeks  Hospital Course:   1-left foot abscess with cellulitis  -Orthopedics consulted -Underwent I&D on 5/10 and repeat debridement 5/12 with wound VAC placement -Treated with broad-spectrum IV antibiotics -Blood cultures are negative -Wound cultures few group F strep and polymicrobial flora, per orthopedics additional antibiotics not needed at discharge however given polymicrobial abscess I have transitioned him to oral doxycycline for 5 more days -Patient has ambulated with physical therapy postop shoe given, nonweightbearing status recommended for his left leg -He is strongly advised to follow-up with Dr. Lajoyce Corners in the office next week when the wound VAC will likely be removed  2-sepsis due to #1. -Resolved  3-hyperglycemia Hb A1c  5.5  4-mild normocytic anemia -No further work-up indicated for now, FU with PCP at [facility]  5-moderate protein calorie malnutrition:  -Supplements advised  6-Polysubstance abuse:/- methamphetamine use  -UDS was positive for opioids , patient declines heroin or other narcotic abuse  -Counseled   Consultants:   Dr. Lajoyce Corners   Procedures:   5-10 I and D, wound vac placement.  5-12repeat I&D and possible split thickness skin graft   Discharge Exam: Vitals:   12/14/18 2038 12/15/18 0446  BP: 120/82 113/76  Pulse: 97 80  Resp: 18 18  Temp: 99 F (37.2 C) 98.4 F (36.9 C)  SpO2: 100% 99%    General: AAOx3 Cardiovascular:S1S2/RRR Respiratory: CTAB  Discharge Instructions   Discharge Instructions    Diet - low sodium heart healthy   Complete by:  As directed    Discharge instructions   Complete by:  As directed    Non weight bearing left leg until Reassessment and FU with Dr.Duda   Increase activity slowly   Complete by:  As directed      Allergies as of 12/15/2018      Reactions   Amoxicillin Hives   Penicillins Hives, Rash   Did it involve swelling of the face/tongue/throat, SOB, or low BP? Unknown Did it involve sudden or severe rash/hives, skin peeling, or any reaction on the inside of your mouth or nose? Unknown Did you need to seek medical attention at a hospital or doctor's office? Unknown When did it last happen? If all above answers are "NO", may proceed with cephalosporin use.      Medication List    STOP taking these medications   clindamycin 300 MG capsule Commonly known as:  Cleocin  diphenhydrAMINE 25 MG tablet Commonly known as:  BENADRYL   glycopyrrolate 2 MG tablet Commonly known as:  ROBINUL   predniSONE 10 MG (21) Tbpk tablet Commonly known as:  STERAPRED UNI-PAK 21 TAB   promethazine 25 MG tablet Commonly known as:  PHENERGAN     TAKE these medications   acetaminophen 325 MG tablet Commonly known as:  TYLENOL Take  1-2 tablets (325-650 mg total) by mouth every 6 (six) hours as needed for mild pain (pain score 1-3 or temp > 100.5).   doxycycline 100 MG tablet Commonly known as:  VIBRA-TABS Take 1 tablet (100 mg total) by mouth 2 (two) times daily for 6 days.   famotidine 10 MG tablet Commonly known as:  PEPCID Take 10 mg by mouth 3 (three) times daily.   ibuprofen 200 MG tablet Commonly known as:  ADVIL Take 800-1,000 mg by mouth every 6 (six) hours as needed for moderate pain.   loratadine 10 MG tablet Commonly known as:  CLARITIN Take 10 mg by mouth daily.   Melatonin 5 MG Tabs Take 5 mg by mouth at bedtime as needed (sleep).   oxyCODONE 5 MG immediate release tablet Commonly known as:  Oxy IR/ROXICODONE Take 1 tablet (5 mg total) by mouth every 6 (six) hours as needed for moderate pain.   vitamin C 500 MG tablet Commonly known as:  ASCORBIC ACID Take 1,500 mg by mouth daily.            Durable Medical Equipment  (From admission, onward)         Start     Ordered   12/14/18 1622  For home use only DME 3 n 1  Once     12/14/18 1622   12/14/18 1622  For home use only DME Walker rolling  Once    Question:  Patient needs a walker to treat with the following condition  Answer:  Cellulitis and abscess of toe of left foot   12/14/18 1622         Allergies  Allergen Reactions  . Amoxicillin Hives  . Penicillins Hives and Rash    Did it involve swelling of the face/tongue/throat, SOB, or low BP? Unknown Did it involve sudden or severe rash/hives, skin peeling, or any reaction on the inside of your mouth or nose? Unknown Did you need to seek medical attention at a hospital or doctor's office? Unknown When did it last happen? If all above answers are "NO", may proceed with cephalosporin use.    Follow-up Information    [facility]. Go to.   Why:  Dec 21, 2018 at 1030 am  Contact information: [address] [place]  [address] [phone]       [name], MD. Call in 1 week(s).   Specialty:  Orthopedic Surgery Why:  please call office for FU in 1 week Contact information: [address] [place] [address] [phone]            The results of significant diagnostics from this hospitalization (including imaging, microbiology, ancillary and laboratory) are listed below for reference.    Significant Diagnostic Studies: Dg Foot Complete Left  Result Date: 12/10/2018 CLINICAL DATA:  Increased soft tissue swelling with possible underlying infection. EXAM: LEFT FOOT - COMPLETE 3+ VIEW COMPARISON:  12/08/2018 FINDINGS: No acute bony abnormality is noted. Considerable soft tissue swelling is noted along the dorsum of the foot with mild soft tissue irregularity consistent with the  given clinical history. No radiopaque foreign body is noted. No changes of osteomyelitis are seen. IMPRESSION: Soft tissue infection along the dorsum of the foot. No acute bony abnormality is noted. Electronically Signed   By: Alcide CleverMark  Lukens M.D.   On: 12/10/2018 21:53   Dg Foot Complete Left  Result Date: 12/08/2018 CLINICAL DATA:  Pt states he has possible bug bite to left foot, on Monday. Pt c/o swollen and painful to touch on left foot. EXAM: LEFT FOOT - COMPLETE 3+ VIEW COMPARISON:  None. FINDINGS: There is no evidence of fracture or dislocation. There is no evidence of arthropathy or other focal bone abnormality. There is relative pes planus. There is soft tissue swelling along the dorsal aspect of the foot. IMPRESSION: 1.  No acute osseous injury of the left foot. 2. Severe soft tissue swelling along the dorsal aspect of the foot consistent with cellulitis. Electronically Signed   By: Elige KoHetal  Patel   On: 12/08/2018 17:34   Vas Koreas Abi With/wo Tbi  Result Date: 12/13/2018 LOWER EXTREMITY DOPPLER STUDY Indications: Left foot wound.  Performing Technologist: Jeb LeveringJill Parker RDMS, RVT  Examination Guidelines: A  complete evaluation includes at minimum, Doppler waveform signals and systolic blood pressure reading at the level of bilateral brachial, anterior tibial, and posterior tibial arteries, when vessel segments are accessible. Bilateral testing is considered an integral part of a complete examination. Photoelectric Plethysmograph (PPG) waveforms and toe systolic pressure readings are included as required and additional duplex testing as needed. Limited examinations for reoccurring indications may be performed as noted.  ABI Findings: +---------+------------------+-----+---------+--------+ Right    Rt Pressure (mmHg)IndexWaveform Comment  +---------+------------------+-----+---------+--------+ Brachial 160                    triphasic         +---------+------------------+-----+---------+--------+ ATA      163               1.02 triphasic         +---------+------------------+-----+---------+--------+ PTA      155               0.97 triphasic         +---------+------------------+-----+---------+--------+ Great Toe153               0.96 Normal            +---------+------------------+-----+---------+--------+ +---------+------------------+-----+---------+-------+ Left     Lt Pressure (mmHg)IndexWaveform Comment +---------+------------------+-----+---------+-------+ Brachial 152                    triphasic        +---------+------------------+-----+---------+-------+ PTA                             triphasic        +---------+------------------+-----+---------+-------+ Great Toe139               0.87 Normal           +---------+------------------+-----+---------+-------+ Unable to obtain ATA waveform or pressure due to patient pain intolerance at that area. Unable to obtain PTA pressure due to patient pain intolerance of cuff. PTA waveform appears normal.  Summary: Right: Resting right ankle-brachial index is within normal range. No evidence of significant right lower  extremity arterial disease. The right toe-brachial index is normal. Left: The left toe-brachial index is normal. See notes above- unable to obtain ABI.  *See table(s) above for measurements and observations.  Electronically signed by Fabienne Brunsharles Fields  MD on 12/13/2018 at 9:44:17 AM.   Final     Microbiology: Recent Results (from the past 240 hour(s))  Blood culture (routine x 2)     Status: None   Collection Time: 12/08/18  5:31 PM  Result Value Ref Range Status   Specimen Description   Final    BLOOD RIGHT FOREARM Performed at [facility], [address], [place], [place] [address]    Special Requests   Final    BOTTLES DRAWN AEROBIC AND ANAEROBIC Blood Culture results may not be optimal due to an inadequate volume of blood received in culture bottles Performed at [facility], [address], [place], [place] [address]    Culture   Final    NO GROWTH 5 DAYS Performed at [facility], [address] [place], [place] [address]    Report Status 12/13/2018 FINAL  Final  Blood culture (routine x 2)     Status: None   Collection Time: 12/08/18  5:38 PM  Result Value Ref Range Status   Specimen Description   Final    BLOOD LEFT ANTECUBITAL Performed at [facility], [address] [place], [place] [address]    Special Requests   Final    BOTTLES DRAWN AEROBIC AND ANAEROBIC Blood Culture adequate volume Performed at [facility], [address], [place], [place] [address]    Culture   Final    NO GROWTH 5 DAYS Performed at [facility], [address] [place], [place] [address]    Report Status 12/13/2018 FINAL  Final  Blood culture (routine x 2)     Status: None (Preliminary result)   Collection Time: 12/10/18  8:46 PM  Result Value Ref Range Status   Specimen Description   Final    BLOOD RIGHT HAND Performed at [facility], [address], [place], [place] [address]    Special  Requests   Final    BOTTLES DRAWN AEROBIC AND ANAEROBIC Blood Culture results may not be optimal due to an excessive volume of blood received in culture bottles Performed at [facility], [address] [place], [place] [address]    Culture   Final    NO GROWTH 4 DAYS Performed at [facility], [address], [place], [place] [address]    Report Status PENDING  Incomplete  Blood culture (routine x 2)     Status: None (Preliminary result)   Collection Time: 12/10/18  8:51 PM  Result Value Ref Range Status   Specimen Description   Final    BLOOD LEFT ANTECUBITAL Performed at [facility], [address] [place], [place] [address]    Special Requests   Final    BOTTLES DRAWN AEROBIC AND ANAEROBIC Blood Culture adequate volume Performed at [facility], [address] [place], [place] [address]    Culture   Final    NO GROWTH 4 DAYS Performed at [facility], [address] [place], [place] [address]    Report Status PENDING  Incomplete  SARS Coronavirus 2 [facility] order, Performed in [facility] hospital lab)     Status: None   Collection Time: 12/10/18 11:00 PM  Result Value Ref Range Status   SARS Coronavirus 2 NEGATIVE NEGATIVE Final    Comment: (NOTE) If result is NEGATIVE SARS-CoV-2 target nucleic acids are NOT DETECTED. The SARS-CoV-2 RNA is generally detectable in upper and  lower  respiratory specimens during the acute phase of infection. The lowest  concentration of SARS-CoV-2 viral copies this assay can detect is 250  copies / mL. A negative result does not preclude SARS-CoV-2 infection  and should not be used as the sole basis for treatment or other  patient management decisions.  A negative result may occur with  improper specimen collection / handling, submission of specimen other  than nasopharyngeal swab, presence of viral mutation(s) within the  areas targeted by this assay, and inadequate  number of viral copies  (<250 copies / mL). A negative result must be combined with clinical  observations, patient history, and epidemiological information. If result is POSITIVE SARS-CoV-2 target nucleic acids are DETECTED. The SARS-CoV-2 RNA is generally detectable in upper and lower  respiratory specimens dur ing the acute phase of infection.  Positive  results are indicative of active infection with SARS-CoV-2.  Clinical  correlation with patient history and other diagnostic information is  necessary to determine patient infection status.  Positive results do  not rule out bacterial infection or co-infection with other viruses. If result is PRESUMPTIVE POSTIVE SARS-CoV-2 nucleic acids MAY BE PRESENT.   A presumptive positive result was obtained on the submitted specimen  and confirmed on repeat testing.  While 2019 novel coronavirus  (SARS-CoV-2) nucleic acids may be present in the submitted sample  additional confirmatory testing may be necessary for epidemiological  and / or clinical management purposes  to differentiate between  SARS-CoV-2 and other Sarbecovirus currently known to infect humans.  If clinically indicated additional testing with an alternate test  methodology [phone]) is advised. The SARS-CoV-2 RNA is generally  detectable in upper and lower respiratory sp ecimens during the acute  phase of infection. The expected result is Negative. Fact Sheet for Patients:  BoilerBrush.com.cy Fact Sheet for Healthcare Providers: https://pope.com/ This test is not yet approved or cleared by the [place] FDA and has been authorized for detection and/or diagnosis of SARS-CoV-2 by FDA under an Emergency Use Authorization (EUA).  This EUA will remain in effect (meaning this test can be used) for the duration of the COVID-19 declaration under Section 564(b)(1) of the Act, 21 U.S.C. section 360bbb-3(b)(1), unless the  authorization is terminated or revoked sooner. Performed at [facility], [address] [place], [place] [address]   Aerobic/Anaerobic Culture (surgical/deep wound)     Status: None (Preliminary result)   Collection Time: 12/11/18  1:08 AM  Result Value Ref Range Status   Specimen Description   Final    WOUND LEFT FOOT Performed at [facility], [address] [place], [place] [address]    Special Requests   Final    NONE Performed at [facility], [address], [place], [place] [address]    Gram Stain   Final    RARE WBC PRESENT, PREDOMINANTLY PMN FEW GRAM NEGATIVE RODS RARE GRAM POSITIVE COCCI IN CLUSTERS    Culture   Final    FEW STREPTOCOCCUS GROUP F HOLDING FOR POSSIBLE ANAEROBE Performed at [facility], [address], [place], [place] [address]    Report Status PENDING  Incomplete  MRSA PCR Screening     Status: Abnormal   Collection Time: 12/11/18 12:15 PM  Result Value Ref Range Status   MRSA by PCR POSITIVE (A) NEGATIVE Final    Comment:        The GeneXpert MRSA Assay (FDA approved for NASAL specimens only), is one component  of a comprehensive MRSA colonization surveillance program. It is not intended to diagnose MRSA infection nor to guide or monitor treatment for MRSA infections. RESULT CALLED TO, READ BACK BY AND VERIFIED WITH: C GRIFFITH,RN 12/11/18 1434 RHOLMES Performed at [facility], [address], [place], [place] [address]      Labs: Basic Metabolic Panel: Recent Labs  Lab 12/11/18 0422 12/12/18 0717 12/13/18 0120 12/14/18 0340 12/15/18 0308  NA 138 138 136 135 135  K 3.8 4.3 4.4 4.5 4.4  CL 104 98 98 100 98  CO2 GLUCOSE 118* 96 98 98 102*  BUN CREATININE 0.68 0.86 0.78 1.09 0.93  CALCIUM 7.9* 8.6* 8.7* 8.7* 8.9  MG 2.0  --   --   --   --   PHOS 3.3  --   --   --   --    Liver Function Tests: Recent Labs  Lab  12/10/18 2046 12/11/18 0422  AST 17 14*  ALT 32 25  ALKPHOS 81 77  BILITOT 0.6 0.5  PROT 6.8 6.1*  ALBUMIN 3.0* 2.6*   No results for input(s): LIPASE, AMYLASE in the last 168 hours. No results for input(s): AMMONIA in the last 168 hours. CBC: Recent Labs  Lab 12/10/18 2046 12/11/18 0422 12/12/18 0717 12/13/18 0120 12/14/18 0340 12/15/18 0308  WBC 16.3* 14.2* 9.6 8.3 10.7* 12.4*  NEUTROABS 12.0*  --   --   --   --   --   HGB 13.4 12.2* 11.7* 11.9* 12.0* 12.2*  HCT 39.9 37.2* 35.4* 35.1* 36.0* 36.3*  MCV 92.4 93.7 91.0 89.3 90.0 90.1  PLT 380 393 418* 439* 456* 474*   Cardiac Enzymes: No results for input(s): CKTOTAL, CKMB, CKMBINDEX, TROPONINI in the last 168 hours. BNP: BNP (last 3 results) No results for input(s): BNP in the last 8760 hours.  ProBNP (last 3 results) No results for input(s): PROBNP in the last 8760 hours.  CBG: No results for input(s): GLUCAP in the last 168 hours.     Signed:  Zannie Cove MD.  [facility] 12/15/2018, 2:14 PM

## 2018-12-15 NOTE — Progress Notes (Signed)
Physical Therapy Treatment & Discharge Patient Details Name: Derek Jennings MRN: [id number] DOB: [birth year] Today's Date: 12/15/2018    History of Present Illness Pt is a 33 y.o. male admitted with L foot abscess, s/p L foot I&D, skin graft and wound vac application on 01/29/35. PMH includes polysubstance abuse.   PT Comments    Pt has progressed well with mobility. Mod indep with gait using RW; mod indep to ascend/descend flight of steps. Great ability to maintain LLE NWB throughout; indep to don/doff cam walker boot. DME delivered to room and adjusted for height. Pt has no further questions or concerns. Has met short-term acute PT goals. Recommend follow-up with OP ortho PT services once able to WB through LLE. Will d/c acute PT.   Follow Up Recommendations  No PT follow up;Supervision - Intermittent     Equipment Recommendations  Rolling walker with 5" wheels;3in1 (PT)    Recommendations for Other Services       Precautions / Restrictions Precautions Precautions: Fall Restrictions Weight Bearing Restrictions: Yes LLE Weight Bearing: Non weight bearing Other Position/Activity Restrictions: CAM walker boot    Mobility  Bed Mobility                  Transfers Overall transfer level: Modified independent Equipment used: Rolling walker (2 wheeled) Transfers: Sit to/from Stand              Ambulation/Gait Ambulation/Gait assistance: Modified independent (Device/Increase time) Gait Distance (Feet): 120 Feet Assistive device: Rolling walker (2 wheeled)       General Gait Details: Good ability to utilize hop-to gait pattern on RLE with RW in order to maintain LLE NWB; mod indep with RW   Stairs Stairs: Yes Stairs assistance: Modified independent (Device/Increase time) Stair Management: One rail Right;Step to pattern;Sideways Number of Stairs: 11 General stair comments: Ascend/descended 11 steps with BUE support on R-side rail, pt able to hop up on RLE while  maintaining LLE NWB. Encouraged rest breaks as needed; educ re: fall risk reduction   Wheelchair Mobility    Modified Rankin (Stroke Patients Only)       Balance Overall balance assessment: Needs assistance   Sitting balance-Leahy Scale: Good Sitting balance - Comments: Indep to don/doff cam walker boot while seated     Standing balance-Leahy Scale: Fair Standing balance comment: Can static stand without UE support                            Cognition Arousal/Alertness: Awake/alert Behavior During Therapy: WFL for tasks assessed/performed Overall Cognitive Status: Within Functional Limits for tasks assessed                                        Exercises      General Comments General comments (skin integrity, edema, etc.): Pt indep with therex      Pertinent Vitals/Pain Pain Assessment: Faces Faces Pain Scale: Hurts a little bit Pain Location: L foot Pain Descriptors / Indicators: Guarding;Sore Pain Intervention(s): Limited activity within patient's tolerance    Home Living                      Prior Function            PT Goals (current goals can now be found in the care plan section) Progress towards PT goals: Goals met/education completed,  patient discharged from PT    Frequency    Min 5X/week      PT Plan Current plan remains appropriate    Co-evaluation              AM-PAC PT "6 Clicks" Mobility   Outcome Measure  Help needed turning from your back to your side while in a flat bed without using bedrails?: None Help needed moving from lying on your back to sitting on the side of a flat bed without using bedrails?: None Help needed moving to and from a bed to a chair (including a wheelchair)?: None Help needed standing up from a chair using your arms (e.g., wheelchair or bedside chair)?: None Help needed to walk in hospital room?: None Help needed climbing 3-5 steps with a railing? : None 6 Click  Score: 24    End of Session   Activity Tolerance: Patient tolerated treatment well Patient left: in chair;with call bell/phone within reach Nurse Communication: Mobility status PT Visit Diagnosis: Other abnormalities of gait and mobility (R26.89)     Time: 4917-9150 PT Time Calculation (min) (ACUTE ONLY): 25 min  Charges:  $Gait Training: 23-37 mins                    Mabeline Caras, PT, DPT Acute Rehabilitation Services  Pager [phone] Office [place] 12/15/2018, 11:35 AM

## 2018-12-15 NOTE — Progress Notes (Signed)
Switched to [employer] wound vac, all questions and concerns addressed, Pt not in distress, discharged home with belongings accompanied by mother.

## 2018-12-15 NOTE — Progress Notes (Signed)
Patient ID: Derek Jennings, male   DOB: 1986-01-23, 33 y.o.   MRN: [id number]   Patient without complaints status post skin graft to the left foot.  There is 100 cc in the wound VAC canister.  Patient did well with therapy yesterday.  Plan for stair training today and patient should be able to discharge after he is safe with therapy.  Patient will not need antibiotics for his foot discharge he will need the Praveena portable pump and I will follow-up in the office next week.

## 2018-12-15 NOTE — TOC Transition Note (Signed)
Transition of Care [facility]) - CM/SW Discharge Note   Patient Details  Name: Derek Jennings MRN: [id number] Date of Birth: Dec 16, 1985  Transition of Care [facility]) CM/SW Contact:  Lawerance Sabal, RN Phone Number: 12/15/2018, 11:45 AM   Clinical Narrative:   DME delivered to room, proveena at bedside for nurse to attach prior to DC, TOC delivered meds. Spoke w patient, reinforced compliance w follow up care, no other needs. To DC home today.     Final next level of care: Home/Self Care Barriers to Discharge: No Barriers Identified   Patient Goals and CMS Choice Patient states their goals for this hospitalization and ongoing recovery are:: to go home  CMS Medicare.gov Compare Post Acute Care list provided to:: Patient Choice offered to / list presented to : NA  Discharge Placement                       Discharge Plan and Services In-house Referral: Financial Counselor Discharge Planning Services: CM Consult, [facility]igent [facility]            DME Arranged: 3-N-1, Walker rolling DME Agency: [employer] Date DME Agency Contacted: 12/14/18 Time DME Agency Contacted: 1626   HH Arranged: NA HH Agency: NA        Social Determinants of Health (SDOH) Interventions     Readmission Risk Interventions No flowsheet data found.

## 2018-12-16 LAB — CULTURE, BLOOD (ROUTINE X 2)
Culture: NO GROWTH
Culture: NO GROWTH
Special Requests: ADEQUATE

## 2018-12-17 ENCOUNTER — Encounter (HOSPITAL_COMMUNITY): Payer: Self-pay | Admitting: Emergency Medicine

## 2018-12-17 ENCOUNTER — Other Ambulatory Visit: Payer: Self-pay

## 2018-12-17 ENCOUNTER — Emergency Department (HOSPITAL_COMMUNITY)
Admission: EM | Admit: 2018-12-17 | Discharge: 2018-12-17 | Disposition: A | Discharge status: Home / Self Care | Payer: Self-pay | Attending: Emergency Medicine | Admitting: Emergency Medicine

## 2018-12-17 DIAGNOSIS — F17210 Nicotine dependence, cigarettes, uncomplicated: Secondary | ICD-10-CM | POA: Insufficient documentation

## 2018-12-17 DIAGNOSIS — Z9889 Other specified postprocedural states: Secondary | ICD-10-CM | POA: Insufficient documentation

## 2018-12-17 DIAGNOSIS — Z79899 Other long term (current) drug therapy: Secondary | ICD-10-CM | POA: Insufficient documentation

## 2018-12-17 DIAGNOSIS — Z5189 Encounter for other specified aftercare: Secondary | ICD-10-CM | POA: Insufficient documentation

## 2018-12-17 LAB — AEROBIC/ANAEROBIC CULTURE (SURGICAL/DEEP WOUND)

## 2018-12-17 LAB — AEROBIC/ANAEROBIC CULTURE W GRAM STAIN (SURGICAL/DEEP WOUND)

## 2018-12-17 NOTE — ED Notes (Signed)
Pt given d/c paperwork. This RN reviewed paperwork with pt. Pt verbalized understanding. Pt unable to sign d/t signature pad not working.

## 2018-12-17 NOTE — ED Notes (Signed)
RN spoke with material management and informed that they do not have the correct equipment needed for patients wound vac. MD aware.

## 2018-12-17 NOTE — ED Triage Notes (Signed)
Pt arrives from home. Pt states he is having a problem with his wound vac. Pt states he called the number of the wound vac and the company told him that there was a blockage in the wound vac and that he needed to come and get the chamber unclogged. No other complaints at this time.

## 2018-12-17 NOTE — ED Provider Notes (Signed)
[facility] EMERGENCY DEPARTMENT Provider Note   CSN: [id number] Arrival date & time: 12/17/18  4098    History   Chief Complaint Chief Complaint  Patient presents with  . Wound Check    HPI Derek Jennings is a 33 y.o. male.     The history is provided by the patient and medical records. No language interpreter was used.  Wound Check  This is a new problem. The current episode started 3 to 5 hours ago. The problem occurs constantly. The problem has not changed since onset.Pertinent negatives include no chest pain, no abdominal pain, no headaches and no shortness of breath. Nothing aggravates the symptoms. Nothing relieves the symptoms. He has tried nothing for the symptoms. The treatment provided no relief.    Past Medical History:  Diagnosis Date  . ADD (attention deficit disorder)   . Allergy   . Hyperhidrosis     Patient Active Problem List   Diagnosis Date Noted  . Polysubstance abuse (HCC) 12/12/2018  . Moderate protein-calorie malnutrition (HCC)   . Left foot infection 12/11/2018  . Hyperglycemia 12/11/2018  . Normocytic anemia 12/11/2018  . Cellulitis and abscess of foot 12/10/2018  . Sepsis (HCC) 12/10/2018  . Wound infection 12/10/2018  . Allergic rhinitis 12/11/2011  . Hyperhidrosis 08/01/2011    Past Surgical History:  Procedure Laterality Date  . ADENOIDECTOMY    . APPLICATION OF WOUND VAC Left 12/13/2018   Procedure: Application Of Wound Vac;  Surgeon: [name], MD;  Location: [facility] OR;  Service: Orthopedics;  Laterality: Left;  . I&D EXTREMITY Left 12/10/2018   Procedure: IRRIGATION AND DEBRIDEMENT EXTREMITY placement of wound vac;  Surgeon: Beverely Low, MD;  Location: [facility] ORS;  Service: Orthopedics;  Laterality: Left;  . I&D EXTREMITY Left 12/13/2018   Procedure: LEFT FOOT DEBRIDEMENT AND SPLIT THICKNESS SKIN GRAFT;  Surgeon: [name], MD;  [facility] OR;  Service: Orthopedics;  Laterality: Left;        Home  Medications    Prior to Admission medications   Medication Sig Start Date End Date Taking? Authorizing Provider  acetaminophen (TYLENOL) 325 MG tablet Take 1-2 tablets (325-650 mg total) by mouth every 6 (six) hours as needed for mild pain (pain score 1-3 or temp > 100.5). 12/14/18   Zannie Cove, MD  doxycycline (VIBRA-TABS) 100 MG tablet Take 1 tablet (100 mg total) by mouth 2 (two) times daily for 6 days. 12/14/18 12/20/18  Zannie Cove, MD  famotidine (PEPCID) 10 MG tablet Take 10 mg by mouth 3 (three) times daily.    [provider]  ibuprofen (ADVIL) 200 MG tablet Take 800-1,000 mg by mouth every 6 (six) hours as needed for moderate pain.    [provider]  loratadine (CLARITIN) 10 MG tablet Take 10 mg by mouth daily.    [provider]  Melatonin 5 MG TABS Take 5 mg by mouth at bedtime as needed (sleep).    [provider]  oxyCODONE (OXY IR/ROXICODONE) 5 MG immediate release tablet Take 1 tablet (5 mg total) by mouth every 6 (six) hours as needed for moderate pain. 12/14/18   Zannie Cove, MD  vitamin C (ASCORBIC ACID) 500 MG tablet Take 1,500 mg by mouth daily.    [provider]    Family History Family History  Problem Relation Age of Onset  . Hypertension Mother   . Heart disease Father   . Stroke Father   . Diabetes Other     Social  History Social History   Tobacco Use  . Smoking status: Current Every Day Smoker    Packs/day: 0.20  . Smokeless tobacco: Former Engineer, waterUser  Substance Use Topics  . Alcohol use: Yes    Comment: socially  . Drug use: No     Allergies   Amoxicillin and Penicillins   Review of Systems Review of Systems  Constitutional: Negative for chills, fatigue and fever.  HENT: Negative for congestion.   Respiratory: Negative for cough, chest tightness, shortness of breath and wheezing.   Cardiovascular: Negative for chest pain and palpitations.  Gastrointestinal: Negative for abdominal pain,  constipation, diarrhea, nausea and vomiting.  Genitourinary: Negative for flank pain and frequency.  Musculoskeletal: Negative for back pain, neck pain and neck stiffness.  Skin: Positive for rash and wound.  Neurological: Negative for dizziness, light-headedness and headaches.  Psychiatric/Behavioral: Negative for agitation.  All other systems reviewed and are negative.    Physical Exam Updated Vital Signs BP 123/85 (BP Location: Right Arm) Comment: Simultaneous filing. User may not have seen previous data. Comment (BP Location): Simultaneous filing. User may not have seen previous data.  Pulse 77 Comment: Simultaneous filing. User may not have seen previous data.  Temp 98.1 F (36.7 C) (Oral) Comment: Simultaneous filing. User may not have seen previous data. Comment (Src): Simultaneous filing. User may not have seen previous data.  Resp 18 Comment: Simultaneous filing. User may not have seen previous data.  SpO2 99% Comment: Simultaneous filing. User may not have seen previous data.  Physical Exam Vitals signs and nursing note reviewed.  Constitutional:      General: He is not in acute distress.    Appearance: He is well-developed. He is not ill-appearing, toxic-appearing or diaphoretic.  HENT:     Head: Normocephalic and atraumatic.     Mouth/Throat:     Pharynx: No oropharyngeal exudate or posterior oropharyngeal erythema.  Eyes:     Conjunctiva/sclera: Conjunctivae normal.     Pupils: Pupils are equal, round, and reactive to light.  Neck:     Musculoskeletal: Neck supple.  Cardiovascular:     Rate and Rhythm: Normal rate and regular rhythm.     Heart sounds: No murmur.  Pulmonary:     Effort: Pulmonary effort is normal. No respiratory distress.     Breath sounds: Normal breath sounds. No wheezing, rhonchi or rales.  Chest:     Chest wall: No tenderness.  Abdominal:     General: Abdomen is flat.     Palpations: Abdomen is soft.     Tenderness: There is no abdominal  tenderness.  Musculoskeletal:        General: No tenderness.     Left ankle: No tenderness.     Right lower leg: No edema.     Left lower leg: No edema.       Feet:  Skin:    General: Skin is warm and dry.     Capillary Refill: Capillary refill takes less than 2 seconds.     Findings: No erythema.  Neurological:     General: No focal deficit present.     Mental Status: He is alert.  Psychiatric:        Mood and Affect: Mood normal.      ED Treatments / Results  Labs (all labs ordered are listed, but only abnormal results are displayed) Labs Reviewed - No data to display  EKG None  Radiology No results found.  Procedures Procedures (including critical care time)  Medications  Ordered in ED Medications - No data to display   Initial Impression / Assessment and Plan / ED Course  I have reviewed the triage vital signs and the nursing notes.  Pertinent labs & imaging results that were available during my care of the patient were reviewed by me and considered in my medical decision making (see chart for details).        Derek Jennings is a 33 y.o. male with a past medical history significant for IV drug abuse and recent foot infection status post orthopedic washout and wound VAC placement who presents with wound VAC problem.  Patient reports that this morning his wound VAC was reporting that it was clogged.  The obstruction of the wound VAC was found to be in the main body of the wound VAC and will "need a new cartridge".  He was told to come to the emergency department so that the wound team can evaluate him.  Otherwise, he reports no new fevers, chills, cough, shortness of breath, nausea, vomiting, urinary symptoms, GI symptoms, or worsened foot pain.  He reports he is using his pain regimen as recommended and is controlling his discomfort.  He reports he is still able to wiggle his toes and feel his foot.  He denies any other complications with his recent surgery.  On  exam, lungs are clear and chest is nontender and abdomen is nontender.  Wound VAC is still in place.  Patient is able to wiggle his toes, has palpable foot pulse, and has normal sensation of the toes.  No erythema seen and no drainage appreciated.  Patient reports he still on his antibiotics and appears to be having a normal postoperative course.  We will consulted wound team for evaluation and management of the wound VAC.  Anticipate discharge after their management.  9:48 AM Wound management team not available at this facility today.  Nursing tried to go get a new device from the operating room stock supply but there was not a compatible device discovered.  Orthopedics was called who recommended the patient come to their clinic at [facility] orthopedics on Monday for further wound VAC management.  They recommend we either take the entire wound VAC off and redress it versus leave it in place and clamped the VAC.  I discussed this with patient he would rather all of the sterile dressing stay in place and just clamp it.  No concern for worsening infection at this time.  Patient has minimal pain.  Patient doing well.  Wound VAC was clamped by me and patient will keep the device in place until he sees them in 2 days.  Patient understood return precautions and had no other questions or concerns.  Patient discharged in good condition.   Final Clinical Impressions(s) / ED Diagnoses   Final diagnoses:  Visit for wound check    ED Discharge Orders    None      Clinical Impression: 1. Visit for wound check     Disposition: Discharge  Condition: Good  I have discussed the results, Dx and Tx plan with the pt(& family if present). He/she/they expressed understanding and agree(s) with the plan. Discharge instructions discussed at great length. Strict return precautions discussed and pt &/or family have verbalized understanding of the instructions. No further questions at time of discharge.     New Prescriptions   No medications on file    Follow Up: [facility] [address] [place] [address] [phone]  Go on  12/19/2018 Please call to schedule appointment on Monday for wound VAC management.  This is at the recommendations of Dr. Magnus Ivan.     Tegeler, Canary Brim, MD 12/17/18 [phone]

## 2018-12-17 NOTE — Discharge Instructions (Signed)
Please go to the orthopedics office on Monday at [facility] orthopedics for evaluation and further management of your wound with your wound VAC.  Dr. Magnus Ivan with orthopedics recommended this due to the problem with the wound VAC.  Please watch for signs and symptoms of worsened infection.  If any symptoms change or worsen, please return to the nearest emergency department.

## 2018-12-19 ENCOUNTER — Ambulatory Visit (INDEPENDENT_AMBULATORY_CARE_PROVIDER_SITE_OTHER): Payer: Self-pay | Admitting: Orthopedic Surgery

## 2018-12-19 ENCOUNTER — Other Ambulatory Visit: Payer: Self-pay

## 2018-12-19 ENCOUNTER — Encounter: Payer: Self-pay | Admitting: Orthopedic Surgery

## 2018-12-19 VITALS — Ht 72.0 in | Wt 195.0 lb

## 2018-12-19 DIAGNOSIS — Z945 Skin transplant status: Secondary | ICD-10-CM

## 2018-12-19 DIAGNOSIS — E440 Moderate protein-calorie malnutrition: Secondary | ICD-10-CM

## 2018-12-19 DIAGNOSIS — L97523 Non-pressure chronic ulcer of other part of left foot with necrosis of muscle: Secondary | ICD-10-CM

## 2018-12-19 NOTE — Progress Notes (Signed)
Office Visit Note   Patient: Derek Jennings           Date of Birth: 1986/08/03           MRN: [id number] Visit Date: 12/19/2018              Requested by: [place] [address] [place], [place] [address] PCP: Margaree Mackintosh, MD  Chief Complaint  Patient presents with  . Left Foot - Routine Post Op    12/13/18 left foot deb & STSG      HPI: The patient is a 33 year old gentleman who is seen for postoperative follow-up following debridement of a left foot abscess/ulcer with placement of a split thickness skin graft, allograft on 12/13/2018.  He had the [facility] on and this malfunctioned over the weekend.  He was seen in the ED and they turned the back off.  He reports his pain is continuing to get better and he is not having to take pain medication now.  Assessment & Plan: Visit Diagnoses:  1. Status post split thickness skin graft   2. Ulcer of left foot, with necrosis of muscle (HCC)   3. Moderate protein-calorie malnutrition (HCC)     Plan: Adaptic, gauze, kerlix and Ace wrapping to left foot every other day . Elevate and off load left foot as much as possible. Follow up in 1 week   Follow-Up Instructions: Return in about 1 week (around 12/26/2018).   Ortho Exam  Patient is alert, oriented, no adenopathy, well-dressed, normal affect, normal respiratory effort. The pack was removed.  There is a large peri-ulcer blister/maceration with slough of the skin.  This was debrided off and the underlying skin is intact.  The ulcer itself sutures are intact with graft intact and adherent over the wound bed which is bleeding pink granulation with the graft showing good early incorporation.  There is no signs for cellulitis of the foot.  He has a palpable pedal pulse.  Dry dressings were applied over Adaptic to the grafted area.  Imaging: No results found.   Labs: Lab Results  Component Value Date   HGBA1C 5.5 12/11/2018   ESRSEDRATE 41 (H) 12/11/2018   CRP 16.8  (H) 12/11/2018   REPTSTATUS 12/17/2018 FINAL 12/11/2018   GRAMSTAIN  12/11/2018    RARE WBC PRESENT, PREDOMINANTLY PMN FEW GRAM NEGATIVE RODS RARE GRAM POSITIVE COCCI IN CLUSTERS    CULT  12/11/2018    FEW STREPTOCOCCUS GROUP F ANAEROBIC GRAM NEGATIVE ROD NONVIABLE FOR ID Performed at [facility], [address], [place], [place] [address]      Lab Results  Component Value Date   ALBUMIN 2.6 (L) 12/11/2018   ALBUMIN 3.0 (L) 12/10/2018   ALBUMIN 4.6 10/10/2016   PREALBUMIN 12.3 (L) 12/11/2018    Body mass index is 26.45 kg/m.  Orders:  No orders of the defined types were placed in this encounter.  No orders of the defined types were placed in this encounter.    Procedures: No procedures performed  Clinical Data: No additional findings.  ROS:  All other systems negative, except as noted in the HPI. Review of Systems  Objective: Vital Signs: Ht 6' (1.829 m)   Wt 195 lb (88.5 kg)   BMI 26.45 kg/m   Specialty Comments:  No specialty comments available.  PMFS History: Patient Active Problem List   Diagnosis Date Noted  . Polysubstance abuse (HCC) 12/12/2018  . Moderate protein-calorie malnutrition (HCC)   . Left  foot infection 12/11/2018  . Hyperglycemia 12/11/2018  . Normocytic anemia 12/11/2018  . Cellulitis and abscess of foot 12/10/2018  . Sepsis (HCC) 12/10/2018  . Wound infection 12/10/2018  . Allergic rhinitis 12/11/2011  . Hyperhidrosis 08/01/2011   Past Medical History:  Diagnosis Date  . ADD (attention deficit disorder)   . Allergy   . Hyperhidrosis     Family History  Problem Relation Age of Onset  . Hypertension Mother   . Heart disease Father   . Stroke Father   . Diabetes Other     Past Surgical History:  Procedure Laterality Date  . ADENOIDECTOMY    . APPLICATION OF WOUND VAC Left 12/13/2018   Procedure: Application Of Wound Vac;  Surgeon: Nadara Mustarduda, Marcus V, MD;  [facility];  Service: Orthopedics;  Laterality: Left;   . I&D EXTREMITY Left 12/10/2018   Procedure: IRRIGATION AND DEBRIDEMENT EXTREMITY placement of wound vac;  Surgeon: Beverely LowNorris, Steve, MD;  Location: [facility] ORS;  Service: Orthopedics;  Laterality: Left;  . I&D EXTREMITY Left 12/13/2018   Procedure: LEFT FOOT DEBRIDEMENT AND SPLIT THICKNESS SKIN GRAFT;  Surgeon: Nadara Mustarduda, Marcus V, MD;  [facility];  Service: Orthopedics;  Laterality: Left;   Social History   Occupational History  . Not on file  Tobacco Use  . Smoking status: Current Every Day Smoker    Packs/day: 0.20  . Smokeless tobacco: Former Engineer, waterUser  Substance and Sexual Activity  . Alcohol use: Yes    Comment: socially  . Drug use: No  . Sexual activity: Not on file

## 2018-12-20 ENCOUNTER — Encounter: Payer: Self-pay | Admitting: Orthopedic Surgery

## 2018-12-20 NOTE — Progress Notes (Deleted)
   Subjective:    Patient ID: Derek Jennings, male    DOB: 12-07-1985, 33 y.o.   MRN: [id number]  Admit date: 12/10/2018 Discharge date: 12/15/2018  Time spent: 35 minutes  Recommendations for Outpatient Follow-up:  1. PCP at [facility] health wellness clinic 2. Orthopedics Dr. Lajoyce Corners in 1 week   Discharge Diagnoses:  Principal Problem:   Cellulitis and abscess of foot Active Problems:   Sepsis (HCC)   Wound infection   Hyperglycemia   Normocytic anemia   Moderate protein-calorie malnutrition (HCC)   Polysubstance abuse [facility])   Discharge Condition: Stable  Diet recommendation: Regular  Filed Weights  12/10/18 2013 12/11/18 0240 12/11/18 1537 Weight: 83.9 kg 83.9 kg 88.5 kg   History of present illness:  33 year old with past medical history significant for polysubstance abuse, IV methamphetamine use presented to the emergency room with pain swelling and redness in his left foot x1 week reportedly started on prednisone urgent care 3 days prior to admission,following this the swelling worsened, pustular area ruptured with purulent discharge and turned black. -Patient reported not using IV drugs for the last several weeks  Hospital Course:   1-left foot abscess with cellulitis -Orthopedics consulted -Underwent I&D on 5/10 and repeat debridement 5/12 with wound VAC placement -Treated with broad-spectrum IV antibiotics -Blood cultures are negative -Wound cultures few group F strep and polymicrobial flora, per orthopedics additional antibiotics not needed at discharge however given polymicrobial abscess I have transitioned him to oral doxycycline for 5 more days -Patient has ambulated with physical therapy postop shoe given, nonweightbearing status recommended for his left leg -He is strongly advised to follow-up with Dr. Lajoyce Corners in the office next week when the wound VAC will likely be removed  2-sepsis due to #1. -Resolved  3-hyperglycemia Hb A1c 5.5  4-mild  normocytic anemia -No further work-up indicated for now, FU with PCP at [facility]  5-moderate protein calorie malnutrition: -Supplements advised  6-Polysubstance abuse:/- methamphetamine use -UDS was positive for opioids,patient declines heroin or other narcotic abuse -Counseled  Consultants:  Dr. Lajoyce Corners       Review of Systems     Objective:   Physical Exam        Assessment & Plan:

## 2018-12-20 NOTE — Progress Notes (Signed)
Thanks. Wow.

## 2018-12-21 ENCOUNTER — Ambulatory Visit: Payer: Self-pay | Admitting: Orthopedic Surgery

## 2018-12-21 ENCOUNTER — Inpatient Hospital Stay: Payer: Self-pay | Admitting: Critical Care Medicine

## 2018-12-27 ENCOUNTER — Ambulatory Visit: Payer: Self-pay | Admitting: Orthopedic Surgery

## 2018-12-30 ENCOUNTER — Ambulatory Visit: Payer: Self-pay | Admitting: Physician Assistant

## 2019-01-04 ENCOUNTER — Ambulatory Visit: Payer: Self-pay | Admitting: Orthopedic Surgery

## 2019-01-05 ENCOUNTER — Ambulatory Visit (INDEPENDENT_AMBULATORY_CARE_PROVIDER_SITE_OTHER): Payer: Self-pay | Admitting: Physician Assistant

## 2019-01-05 ENCOUNTER — Other Ambulatory Visit: Payer: Self-pay

## 2019-01-05 ENCOUNTER — Encounter: Payer: Self-pay | Admitting: Orthopedic Surgery

## 2019-01-05 VITALS — Ht 72.0 in | Wt 195.0 lb

## 2019-01-05 DIAGNOSIS — L97521 Non-pressure chronic ulcer of other part of left foot limited to breakdown of skin: Secondary | ICD-10-CM

## 2019-01-05 DIAGNOSIS — E440 Moderate protein-calorie malnutrition: Secondary | ICD-10-CM

## 2019-01-05 DIAGNOSIS — Z945 Skin transplant status: Secondary | ICD-10-CM

## 2019-01-05 MED ORDER — SILVER SULFADIAZINE 1 % EX CREA: 1.0000 "application " | TOPICAL_CREAM | Freq: Every day | CUTANEOUS | 0 refills | Status: AC

## 2019-01-05 NOTE — Progress Notes (Signed)
Office Visit Note   Patient: Derek Jennings           Date of Birth: 10-03-1985           MRN: [id number] Visit Date: 01/05/2019              Requested by: Margaree Mackintosh, MD [address] [place], [place] [address] PCP: Margaree Mackintosh, MD  Chief Complaint  Patient presents with  . Left Foot - Routine Post Op    12/13/18 left foot deb & STSG      HPI: The patient is a 33 yo gentleman who is seen for post operative follow up following debridement of left foot abscess and placement of skin graft, allograft to the area 12/13/2018.  He has been off loading and elevating as much as possible and using a fracture boot.   Assessment & Plan: Visit Diagnoses:  1. Status post split thickness skin graft   2. Moderate protein-calorie malnutrition (HCC)   3. Ulcer of left foot, with necrosis of muscle (HCC)     Plan: Sutures removed. May begin daily washing with soap and water in shower and apply silvadene cream to the area and dry dressing and Ace wrap. May walk with post op shoe.   Follow-Up Instructions: Return in about 1 week (around 01/12/2019).   Ortho Exam  Patient is alert, oriented, no adenopathy, well-dressed, normal affect, normal respiratory effort. The left foot graft is well incorporated except for a small portion over the distal wound bed which may be residual graft which has not incorporated vs.slough. No signs of cellulitis or infection. Sutures were removed today. Good pedal pulses.   Imaging: No results found.   Labs: Lab Results  Component Value Date   HGBA1C 5.5 12/11/2018   ESRSEDRATE 41 (H) 12/11/2018   CRP 16.8 (H) 12/11/2018   REPTSTATUS 12/17/2018 FINAL 12/11/2018   GRAMSTAIN  12/11/2018    RARE WBC PRESENT, PREDOMINANTLY PMN FEW GRAM NEGATIVE RODS RARE GRAM POSITIVE COCCI IN CLUSTERS    CULT  12/11/2018    FEW STREPTOCOCCUS GROUP F ANAEROBIC GRAM NEGATIVE ROD NONVIABLE FOR ID Performed at [facility], [address], [place],  [place] [address]      Lab Results  Component Value Date   ALBUMIN 2.6 (L) 12/11/2018   ALBUMIN 3.0 (L) 12/10/2018   ALBUMIN 4.6 10/10/2016   PREALBUMIN 12.3 (L) 12/11/2018    Body mass index is 26.45 kg/m.  Orders:  No orders of the defined types were placed in this encounter.  Meds ordered this encounter  Medications  . silver sulfADIAZINE (SILVADENE) 1 % cream    Sig: Apply 1 application topically daily. Apply to left foot daily    Dispense:  50 g    Refill:  0     Procedures: No procedures performed  Clinical Data: No additional findings.  ROS:  All other systems negative, except as noted in the HPI. Review of Systems  Objective: Vital Signs: Ht 6' (1.829 m)   Wt 195 lb (88.5 kg)   BMI 26.45 kg/m   Specialty Comments:  No specialty comments available.  PMFS History: Patient Active Problem List   Diagnosis Date Noted  . Polysubstance abuse (HCC) 12/12/2018  . Moderate protein-calorie malnutrition (HCC)   . Left foot infection 12/11/2018  . Hyperglycemia 12/11/2018  . Normocytic anemia 12/11/2018  . Cellulitis and abscess of foot 12/10/2018  . Sepsis (HCC) 12/10/2018  . Wound infection 12/10/2018  . Allergic rhinitis 12/11/2011  .  Hyperhidrosis 08/01/2011   Past Medical History:  Diagnosis Date  . ADD (attention deficit disorder)   . Allergy   . Hyperhidrosis     Family History  Problem Relation Age of Onset  . Hypertension Mother   . Heart disease Father   . Stroke Father   . Diabetes Other     Past Surgical History:  Procedure Laterality Date  . ADENOIDECTOMY    . APPLICATION OF WOUND VAC Left 12/13/2018   Procedure: Application Of Wound Vac;  Surgeon: Nadara Mustarduda, Marcus V, MD;  [facility];  Service: Orthopedics;  Laterality: Left;  . I&D EXTREMITY Left 12/10/2018   Procedure: IRRIGATION AND DEBRIDEMENT EXTREMITY placement of wound vac;  Surgeon: Beverely LowNorris, Steve, MD;  Location: [facility] ORS;  Service: Orthopedics;  Laterality: Left;  . I&D EXTREMITY  Left 12/13/2018   Procedure: LEFT FOOT DEBRIDEMENT AND SPLIT THICKNESS SKIN GRAFT;  Surgeon: Nadara Mustarduda, Marcus V, MD;  [facility];  Service: Orthopedics;  Laterality: Left;   Social History   Occupational History  . Not on file  Tobacco Use  . Smoking status: Current Every Day Smoker    Packs/day: 0.20  . Smokeless tobacco: Former Engineer, waterUser  Substance and Sexual Activity  . Alcohol use: Yes    Comment: socially  . Drug use: No  . Sexual activity: Not on file

## 2019-01-12 ENCOUNTER — Encounter: Payer: Self-pay | Admitting: Physician Assistant

## 2019-01-12 ENCOUNTER — Ambulatory Visit (INDEPENDENT_AMBULATORY_CARE_PROVIDER_SITE_OTHER): Payer: Self-pay | Admitting: Physician Assistant

## 2019-01-12 ENCOUNTER — Other Ambulatory Visit: Payer: Self-pay

## 2019-01-12 VITALS — Ht 72.0 in | Wt 195.0 lb

## 2019-01-12 DIAGNOSIS — E440 Moderate protein-calorie malnutrition: Secondary | ICD-10-CM

## 2019-01-12 DIAGNOSIS — Z945 Skin transplant status: Secondary | ICD-10-CM

## 2019-01-12 NOTE — Progress Notes (Signed)
Office Visit Note   Patient: Derek Jennings           Date of Birth: [birth year]           MRN: [id number] Visit Date: 01/12/2019              Requested by: Elby Showers, MD [address] [place],  [place] [address] PCP: Elby Showers, MD  Chief Complaint  Patient presents with  . Left Foot - Routine Post Op    12/13/18 left foot I&D abscess STSG      HPI: The patient is a 33 yo gentleman who is seen for post operative follow up following debridement of a left foot abscess and placement of a skin graft, allograft to the residual wound 12/13/2018.  He has been applying silvadene cream to the area daily and off loading as much as possible. He reports no pain over the area.   Assessment & Plan: Visit Diagnoses:  1. Status post split thickness skin graft   2. Moderate protein-calorie malnutrition ([place])     Plan: Continue silvadene cream to the area daily after washing with soap and water. Continue to elevate and off load as much as possible.  Follow up in 2 weeks.   Follow-Up Instructions: Return in about 2 weeks (around 01/26/2019).   Ortho Exam  Patient is alert, oriented, no adenopathy, well-dressed, normal affect, normal respiratory effort. The left foot- graft over the dorsal wound has continued to incorporate well and is without signs of infection or cellulitis. No edema. Good pedal pulses. Graft is flush with the skin level without hypergranulation.   Imaging: No results found. No images are attached to the encounter.  Labs: Lab Results  Component Value Date   HGBA1C 5.5 12/11/2018   ESRSEDRATE 41 (H) 12/11/2018   CRP 16.8 (H) 12/11/2018   REPTSTATUS 12/17/2018 FINAL 12/11/2018   GRAMSTAIN  12/11/2018    RARE WBC PRESENT, PREDOMINANTLY PMN FEW GRAM NEGATIVE RODS RARE GRAM POSITIVE COCCI IN CLUSTERS    CULT  12/11/2018    FEW STREPTOCOCCUS GROUP F ANAEROBIC GRAM NEGATIVE ROD NONVIABLE FOR ID Performed at [facility], [place] [address]  [place], [place] [address]      Lab Results  Component Value Date   ALBUMIN 2.6 (L) 12/11/2018   ALBUMIN 3.0 (L) 12/10/2018   ALBUMIN 4.6 10/10/2016   PREALBUMIN 12.3 (L) 12/11/2018    Body mass index is 26.45 kg/m.  Orders:  No orders of the defined types were placed in this encounter.  No orders of the defined types were placed in this encounter.    Procedures: No procedures performed  Clinical Data: No additional findings.  ROS:  All other systems negative, except as noted in the HPI. Review of Systems  Objective: Vital Signs: Ht 6' (1.829 m)   Wt 195 lb (88.5 kg)   BMI 26.45 kg/m   Specialty Comments:  No specialty comments available.  PMFS History: Patient Active Problem List   Diagnosis Date Noted  . Polysubstance abuse ([place]) 12/12/2018  . Moderate protein-calorie malnutrition ([place])   . Left foot infection 12/11/2018  . Hyperglycemia 12/11/2018  . Normocytic anemia 12/11/2018  . Cellulitis and abscess of foot 12/10/2018  . Sepsis ([place]) 12/10/2018  . Wound infection 12/10/2018  . Allergic rhinitis 12/11/2011  . Hyperhidrosis 08/01/2011   Past Medical History:  Diagnosis Date  . ADD (attention deficit disorder)   . Allergy   . Hyperhidrosis     Family History  Problem Relation Age of Onset  . Hypertension Mother   . Heart disease Father   . Stroke Father   . Diabetes Other     Past Surgical History:  Procedure Laterality Date  . ADENOIDECTOMY    . APPLICATION OF WOUND VAC Left 12/13/2018   Procedure: Application Of Wound Vac;  Surgeon: Nadara Mustarduda, Marcus V, MD;  [facility];  Service: Orthopedics;  Laterality: Left;  . I&D EXTREMITY Left 12/10/2018   Procedure: IRRIGATION AND DEBRIDEMENT EXTREMITY placement of wound vac;  Surgeon: Beverely LowNorris, Steve, MD;  Location: [facility] ORS;  Service: Orthopedics;  Laterality: Left;  . I&D EXTREMITY Left 12/13/2018   Procedure: LEFT FOOT DEBRIDEMENT AND SPLIT THICKNESS SKIN GRAFT;  Surgeon: Nadara Mustarduda, Marcus V, MD;  [facility];  Service: Orthopedics;  Laterality: Left;   Social History   Occupational History  . Not on file  Tobacco Use  . Smoking status: Current Every Day Smoker    Packs/day: 0.20  . Smokeless tobacco: Former Engineer, waterUser  Substance and Sexual Activity  . Alcohol use: Yes    Comment: socially  . Drug use: No  . Sexual activity: Not on file

## 2019-01-13 ENCOUNTER — Encounter: Payer: Self-pay | Admitting: Physician Assistant

## 2019-01-26 ENCOUNTER — Ambulatory Visit: Payer: Self-pay | Admitting: Physician Assistant

## 2019-07-17 ENCOUNTER — Telehealth: Payer: Self-pay | Admitting: Internal Medicine

## 2019-07-17 NOTE — Telephone Encounter (Addendum)
[address] [phone]  Derek Jennings called to see if he could get an appointment and a refill on his Adderall. The last time he was seen here was 07/02/2015, Last CPE 01/07/2015. I do not see where he has had a prescription for Adderall.

## 2019-07-17 NOTE — Telephone Encounter (Signed)
Called patient and the one number he gave me was not in service, the number I seen he was calling from was someone else's name, so I did not LVM,

## 2019-07-17 NOTE — Telephone Encounter (Signed)
I am not willing to fill this.

## 2019-07-17 NOTE — Telephone Encounter (Signed)
Attempted to call patient back, phone just rang no option to LVM

## 2019-11-17 IMAGING — CR LEFT FOOT - COMPLETE 3+ VIEW
3 series · 3 of 3 positions shown · non-contrast
Comparison: None.

CLINICAL DATA: Pt states he has possible bug bite to left foot, on
[REDACTED]. Pt c/o swollen and painful to touch on left foot.

EXAM:
LEFT FOOT - COMPLETE 3+ VIEW

[x foot ap left]
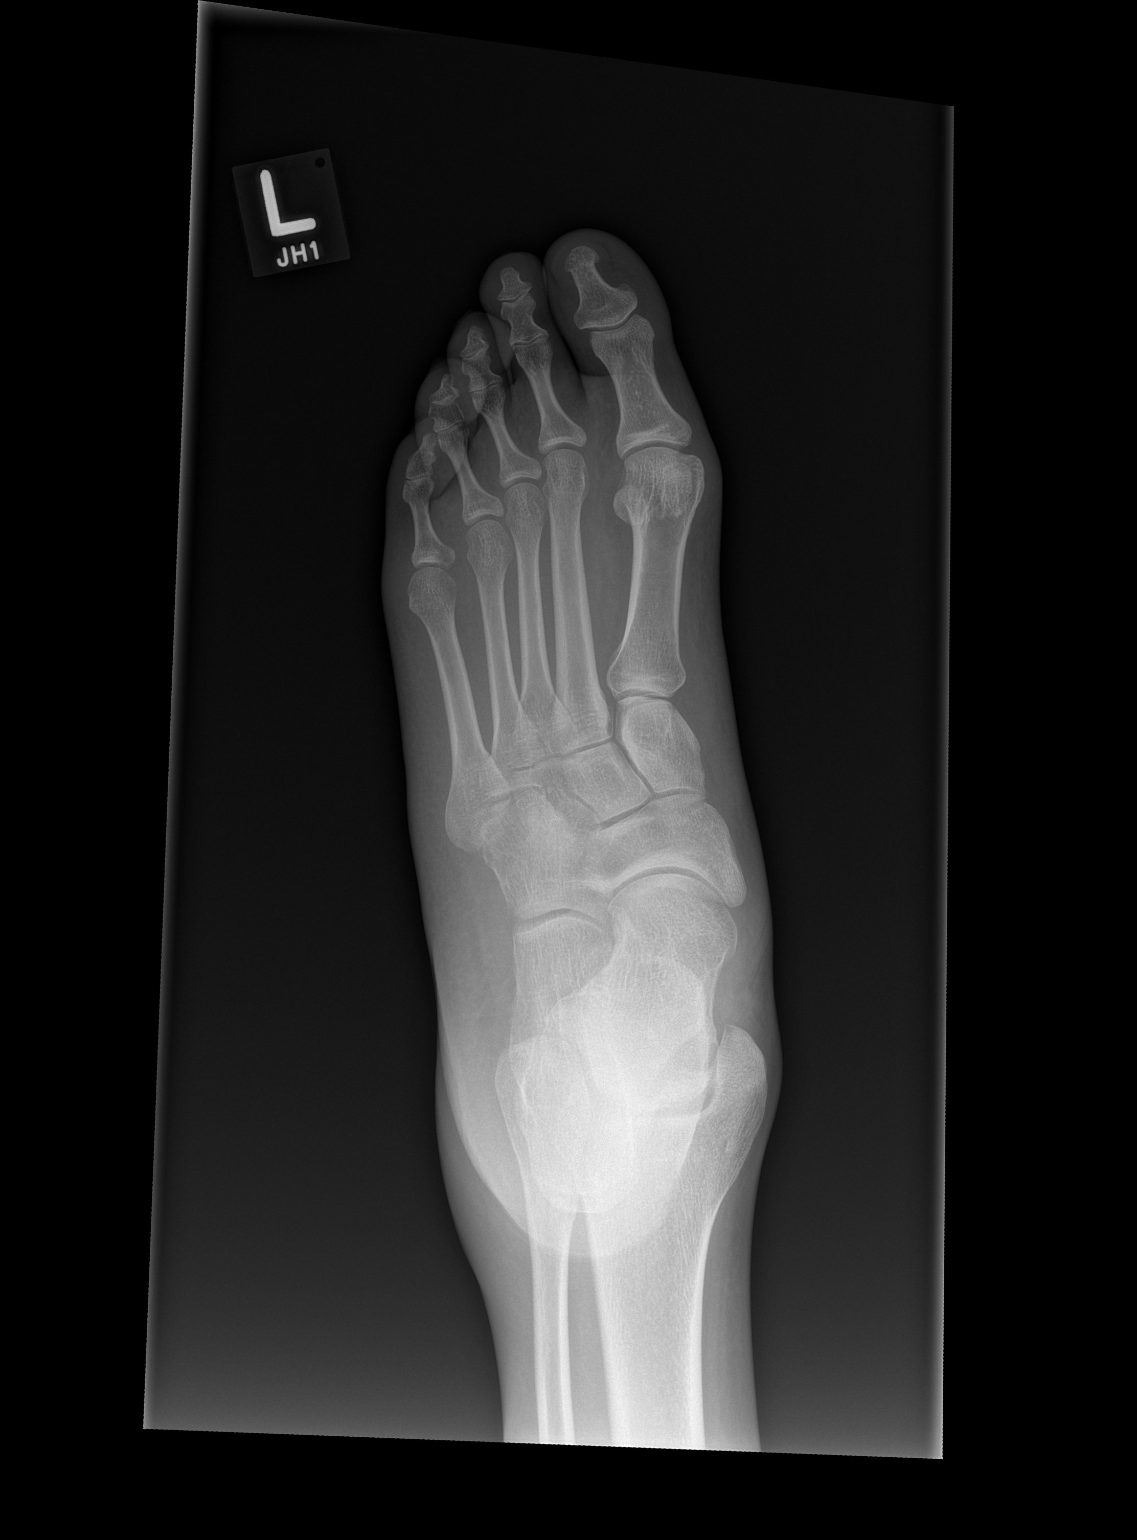

[x foot obl left]
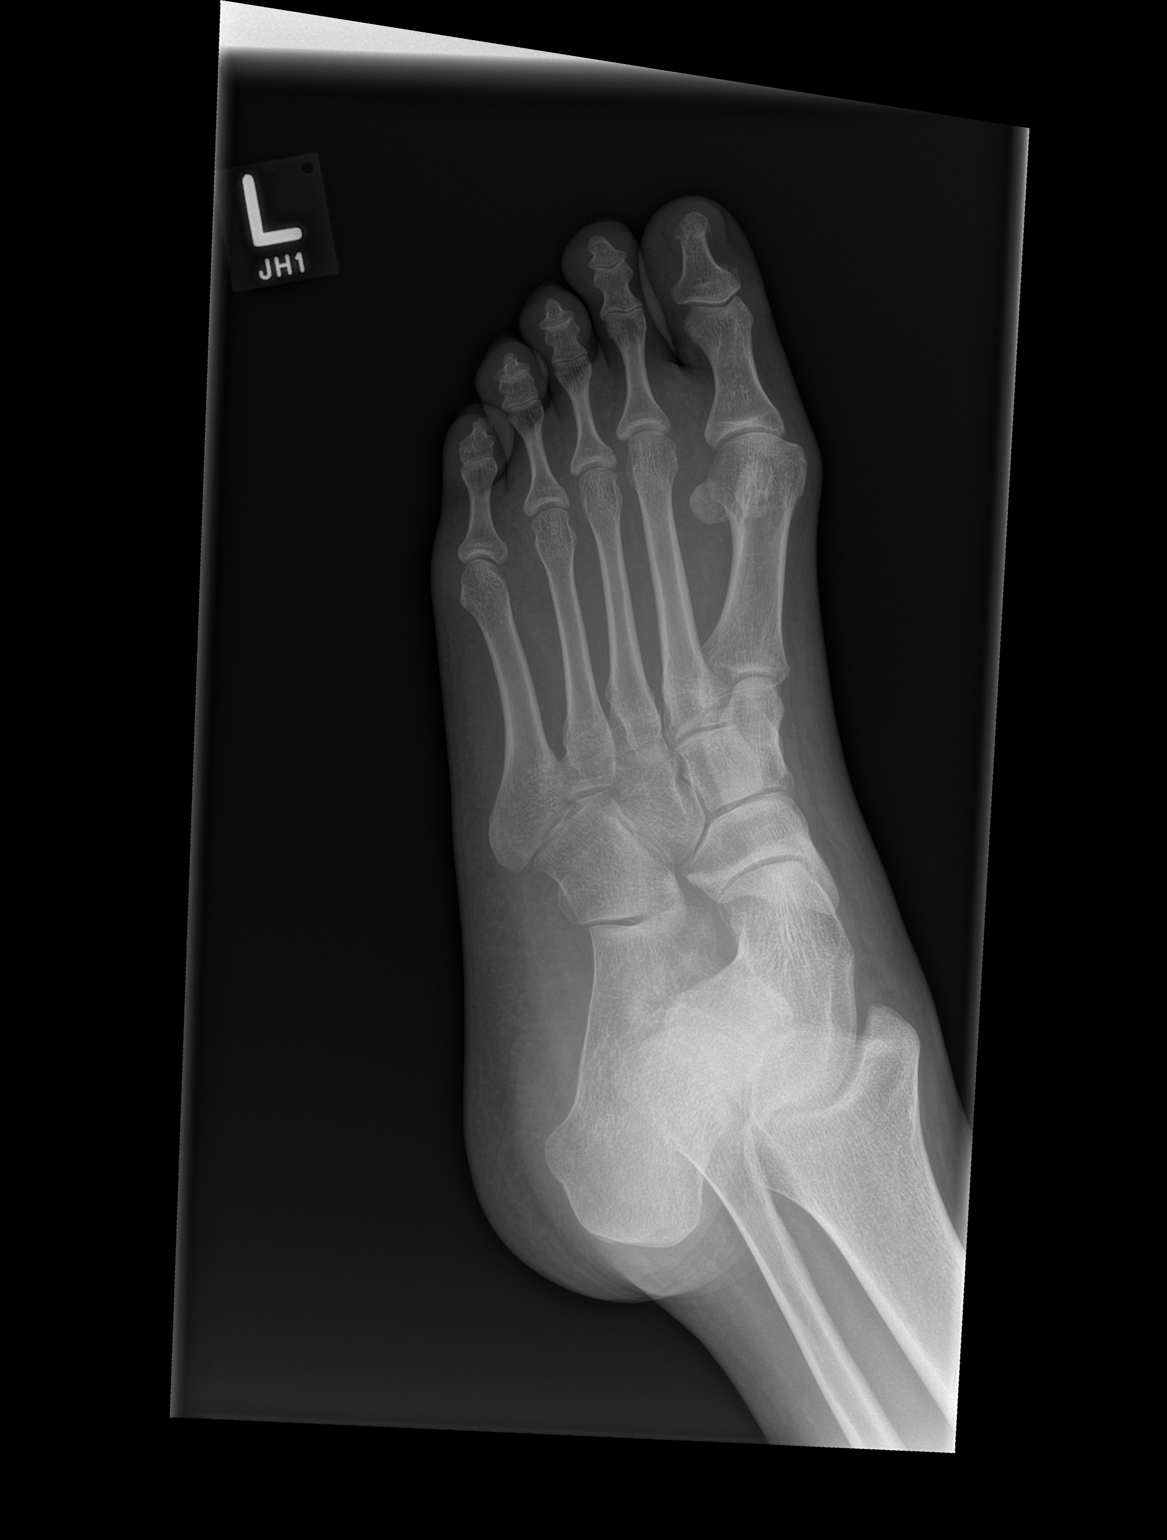

[x foot lat left]
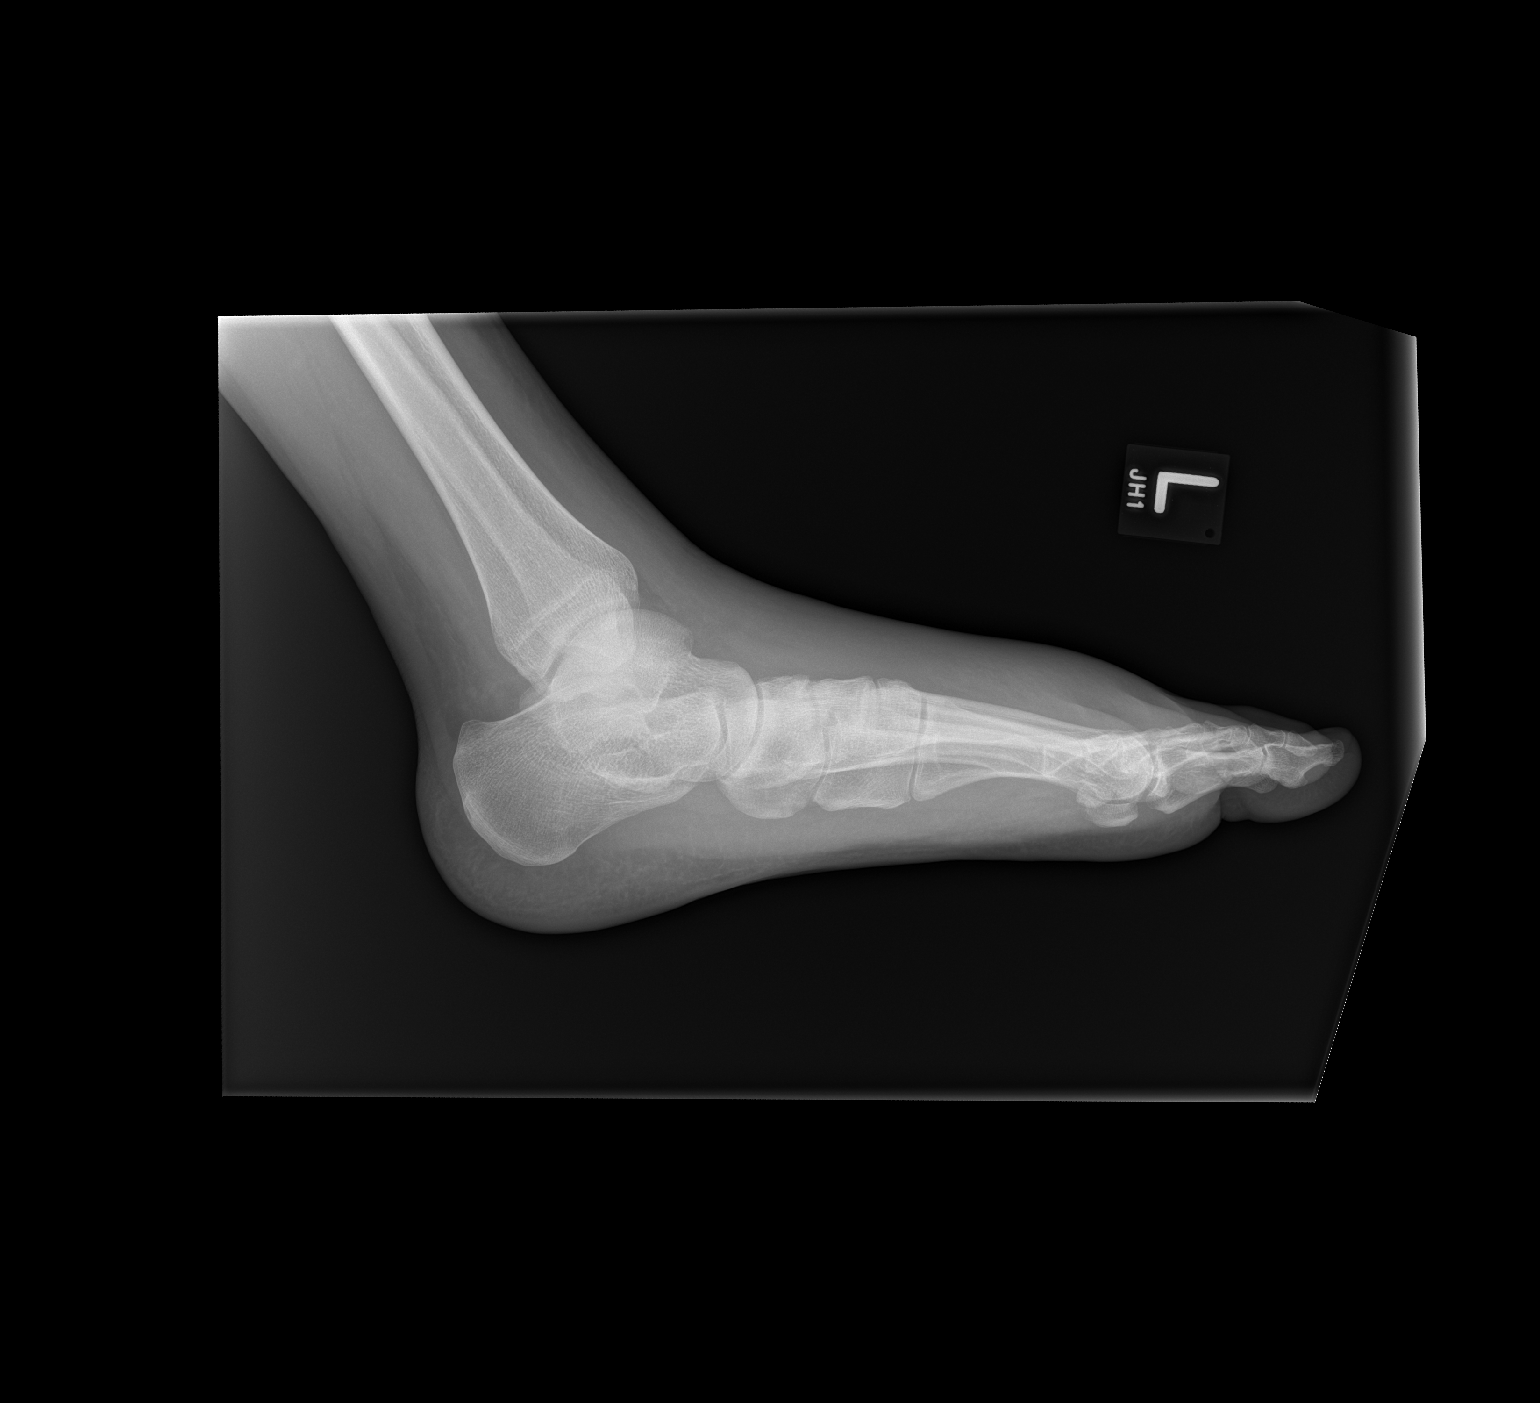

[3 of 3 positions shown; findings below may reference images not displayed]

FINDINGS: There is no evidence of fracture or dislocation. There is no
evidence of arthropathy or other focal bone abnormality. There is
relative pes planus. There is soft tissue swelling along the dorsal
aspect of the foot.
IMPRESSION: 1.  No acute osseous injury of the left foot.
2. Severe soft tissue swelling along the dorsal aspect of the foot
consistent with cellulitis.

## 2020-01-08 ENCOUNTER — Telehealth (HOSPITAL_COMMUNITY): Payer: Self-pay | Admitting: Psychiatry

## 2020-01-30 ENCOUNTER — Ambulatory Visit (HOSPITAL_COMMUNITY): Payer: Self-pay | Admitting: Licensed Clinical Social Worker

## 2020-02-02 ENCOUNTER — Telehealth (HOSPITAL_COMMUNITY): Payer: Self-pay | Admitting: Psychiatric/Mental Health

## 2020-02-02 ENCOUNTER — Other Ambulatory Visit: Payer: Self-pay

## 2020-04-01 ENCOUNTER — Ambulatory Visit (HOSPITAL_COMMUNITY)
Admission: RE | Admit: 2020-04-01 | Discharge: 2020-04-01 | Disposition: A | Discharge status: Home / Self Care | Payer: No Payment, Other | Attending: Psychiatry | Admitting: Psychiatry

## 2020-04-01 ENCOUNTER — Telehealth: Payer: Self-pay | Admitting: Internal Medicine

## 2020-04-01 DIAGNOSIS — F419 Anxiety disorder, unspecified: Secondary | ICD-10-CM | POA: Insufficient documentation

## 2020-04-01 DIAGNOSIS — Z79899 Other long term (current) drug therapy: Secondary | ICD-10-CM | POA: Insufficient documentation

## 2020-04-01 DIAGNOSIS — Z597 Insufficient social insurance and welfare support: Secondary | ICD-10-CM | POA: Insufficient documentation

## 2020-04-01 DIAGNOSIS — Z760 Encounter for issue of repeat prescription: Secondary | ICD-10-CM | POA: Insufficient documentation

## 2020-04-01 NOTE — H&P (Signed)
Behavioral Health Medical Screening Exam  Derek Jennings is an 34 y.o. male.who presented to [facility], voluntarily, accompanied bu his mother who was present during the evaluation with patients consent. Patient stated he came to [facility] after he refereed by [facility]. He stated that in the past, he received medication management through [facility] however, after they moved to a different location and after he returned, he was told that they could not assist assist him due to him no longer having insurance. He stated that his current medication is Mirtazapine which is prescribed for anxiety. He denied current SI, HI, psychosis and depressed mood. He denied prior suicide attempts or self-harming behaviors. He reported one prior psychiatric hospitalization, [place] (2019 or 2020) and per chart review, he had an unintentional overdose in 2018. He admitted to a history of methamphetamine use  Which appeared to be what he unintentionally overdose on in 2018. He denied current substances abuse or use. Denied access to firearms. Denied anger or current legal issues. Denied concerns with appetite or sleep. Denied history of physical, sexual, emotional abuse or other truama related disorder. Denied other concerns t this time.   Total Time spent with patient: 20 minutes  Psychiatric Specialty Exam: Physical Exam Psychiatric:        Mood and Affect: Mood normal.        Behavior: Behavior normal.        Thought Content: Thought content normal.        Judgment: Judgment normal.    Review of Systems  Psychiatric/Behavioral: Negative for agitation, behavioral problems, confusion, decreased concentration, dysphoric mood, hallucinations, self-injury, sleep disturbance and suicidal ideas. The patient is nervous/anxious. The patient is not hyperactive.    There were no vitals taken for this visit.There is no height or weight on file to calculate BMI. General Appearance: Casual Eye Contact:  Good Speech:  Clear and Coherent and  Normal Rate Volume:  Normal Mood:  Anxious Affect:  Appropriate Thought Process:  Coherent, Linear and Descriptions of Associations: Intact Orientation:  Full (Time, Place, and Person) Thought Content:  Logical Suicidal Thoughts:  No Homicidal Thoughts:  No Memory:  Immediate;   Fair Recent;   Fair Judgement:  Fair Insight:  Fair Psychomotor Activity:  Normal Concentration: Concentration: Fair and Attention Span: Fair Recall:  [employer] of Knowledge:Fair Language: Good Akathisia:  Negative Handed:  Right AIMS (if indicated):    Assets:  Communication Skills Desire for Improvement Resilience Social Support Sleep:     Musculoskeletal: Strength & Muscle Tone: within normal limits Gait & Station: normal Patient leans: N/A  There were no vitals taken for this visit.  Recommendations: Based on my evaluation the patient does not appear to have an emergency medical condition.   No evidence of imminent risk to self or others at present.   Patient does not meet criteria for psychiatric inpatient admission. Resources for the [facility] provided for medication management and therapy. Patient and mother was encouraged to follow-up with resources for establishment of care.     Denzil Magnuson, NP 04/01/2020, 11:39 AM

## 2020-04-01 NOTE — Telephone Encounter (Signed)
I have removed Dr Lenord Fellers as PCP from this patients chart. She has not seen patient since 06/2015. After 2 years of not being seen patient would have to re-establish care. Tried to follow up in 2018 after ED visit was unable to. Also tried calling pt back in 2020 when he called and it was not his number that he called from, unable to reach him once again.

## 2020-04-01 NOTE — BH Assessment (Addendum)
Assessment Note  Derek Jennings is an 34 y.o. male with ADD. He presents to [facility] as a walk-in. His mother transported him to [facility] and was present during today's assessment. Patient states that he went to [facility] today and was told that they are no longer taking patient's without insurance. He was referred to [facility] to see if he could get set up with an appointment for outpatient mental health services. Patient stating that he takes Mirtazapine 15 mg's for sleep and depression. He would like to continue his compliance with those medications.   Patient denies SI. He denies a prior history of suicide attempts and/or self mutilating behaviors. Denies depressive symptoms today. However, admits to a history of depression. Denies HI. Denies AVH's. However, states that his neighbors are walking up to him telling him that I'm going to jail/prison. Mom believes this is more of patient's paranoia and he has a history of similar reports. Patient has a history of substance use--crystal meth. Last use was 1 yr ago.  Patient has no history of inpatient treatment. However, has been under the care of outpatient services for several yrs.   Patient's appetite is normal. He sleeps well. Patient is oriented to person, place, time, and situation. Speech is normal. Insight and judgement are both good. Memory is recent and remote intact.   Per Denzil Magnuson, NP, patient does not meet criteria for psychiatric inpatient admission. Resources for the [facility] provided for medication management and therapy. Patient and mother was encouraged to follow-up with resources for establishment of care.   Diagnosis: Major Depressive Disorder, Recurrent and ADD  Past Medical History:  Past Medical History:  Diagnosis Date  . ADD (attention deficit disorder)   . Allergy   . Hyperhidrosis     Past Surgical History:  Procedure Laterality Date  . ADENOIDECTOMY    . APPLICATION OF WOUND VAC Left 12/13/2018    Procedure: Application Of Wound Vac;  Surgeon: [name], MD;  Location: [facility] OR;  Service: Orthopedics;  Laterality: Left;  . I & D EXTREMITY Left 12/10/2018   Procedure: IRRIGATION AND DEBRIDEMENT EXTREMITY placement of wound vac;  Surgeon: Beverely Low, MD;  Location: [facility] ORS;  Service: Orthopedics;  Laterality: Left;  . I & D EXTREMITY Left 12/13/2018   Procedure: LEFT FOOT DEBRIDEMENT AND SPLIT THICKNESS SKIN GRAFT;  Surgeon: [name], MD;  Location: [facility] OR;  Service: Orthopedics;  Laterality: Left;    Family History:  Family History  Problem Relation Age of Onset  . Hypertension Mother   . Heart disease Father   . Stroke Father   . Diabetes Other     Social History:  reports that he has been smoking. He has been smoking about 0.20 packs per day. He has quit using smokeless tobacco. He reports current alcohol use. He reports that he does not use drugs.  Additional Social History:     CIWA:   COWS:    Allergies:  Allergies  Allergen Reactions  . Amoxicillin Hives  . Penicillins Hives and Rash    Did it involve swelling of the face/tongue/throat, SOB, or low BP? Unknown Did it involve sudden or severe rash/hives, skin peeling, or any reaction on the inside of your mouth or nose? Unknown Did you need to seek medical attention at a hospital or doctor's office? Unknown When did it last happen? If all above answers are "NO", may proceed with cephalosporin use.     Home Medications: (Not in a  hospital admission)   OB/GYN Status:  No LMP for male patient.  General Assessment Data [facility] of Assessment: [facility] ED TTS Assessment: In system Is this a Tele or Face-to-Face Assessment?: Face-to-Face Is this an Initial Assessment or a Re-assessment for this encounter?: Initial Assessment Patient Accompanied by:: Parent Language Other than English: No Living Arrangements: Other (Comment) (lives alone ) What gender do you identify as?: Male Marital status: Single Maiden  name:  (n/a) Pregnancy Status: No Living Arrangements: Alone Can pt return to current living arrangement?: Yes Admission Status: Voluntary Is patient capable of signing voluntary admission?: Yes Referral Source: Self/Family/Friend Insurance type:  (Self Pay )     Crisis Care Plan Living Arrangements: Alone Legal Guardian: Other: (no legal guardian ) Name of Psychiatrist:  (no psychiatrist ) Name of Therapist:  (no therapist )     Risk to self with the past 6 months Suicidal Ideation: No (no suicidal ideations) Has patient been a risk to self within the past 6 months prior to admission? : No Suicidal Intent: No Has patient had any suicidal intent within the past 6 months prior to admission? : No Is patient at risk for suicide?: No Suicidal Plan?: No Has patient had any suicidal plan within the past 6 months prior to admission? : No Access to Means: No What has been your use of drugs/alcohol within the last 12 months?:  (history of crystal meth use ) Previous Attempts/Gestures: No How many times?:  (n/a) Other Self Harm Risks:  (no self harm risk ) Triggers for Past Attempts:  (n/a) Intentional Self Injurious Behavior: None Family Suicide History: No Recent stressful life event(s): Other (Comment) (living alone, paranoid about neighbors) Persecutory voices/beliefs?: No Depression: No Depression Symptoms:  (no symptoms reported ) Substance abuse history and/or treatment for substance abuse?: No Suicide prevention information given to non-admitted patients: Not applicable  Risk to Others within the past 6 months Homicidal Ideation: No Does patient have any lifetime risk of violence toward others beyond the six months prior to admission? : No Thoughts of Harm to Others: No Current Homicidal Intent: No Current Homicidal Plan: No Access to Homicidal Means: No Identified Victim:  (n/a) History of harm to others?: No Assessment of Violence: None Noted Violent Behavior  Description:  (currently calm and cooperative ) Does patient have access to weapons?: No Criminal Charges Pending?: No Does patient have a court date: No Is patient on probation?: No  Psychosis Hallucinations: None noted Delusions: Unspecified (paranoid-neighbors tell him he is going to jail )  Mental Status Report Appearance/Hygiene: Unremarkable Eye Contact: Good Motor Activity: Freedom of movement Speech: Logical/coherent Level of Consciousness: Alert Mood: Depressed Affect: Appropriate to circumstance Anxiety Level: None Thought Processes: Coherent, Relevant Judgement: Impaired Orientation: Person, Situation, Place, Time Obsessive Compulsive Thoughts/Behaviors: None  Cognitive Functioning Concentration: Normal Memory: Recent Intact, Remote Intact Is patient IDD: No Insight: Poor Impulse Control: Poor Appetite: Poor Have you had any weight changes? : No Change Sleep: Decreased Total Hours of Sleep:  (8-10) Vegetative Symptoms: None  ADLScreening [facility] Assessment Services) Patient's cognitive ability adequate to safely complete daily activities?: Yes Patient able to express need for assistance with ADLs?: Yes Independently performs ADLs?: Yes (appropriate for developmental age)  Prior Inpatient Therapy Prior Inpatient Therapy: No  Prior Outpatient Therapy Prior Outpatient Therapy: No Does patient have an ACCT team?: No Does patient have Intensive In-House Services?  : No Does patient have [facility] services? : No Does patient have P4CC services?: No  ADL Screening (condition  at time of admission) Patient's cognitive ability adequate to safely complete daily activities?: Yes Is the patient deaf or have difficulty hearing?: No Does the patient have difficulty seeing, even when wearing glasses/contacts?: No Does the patient have difficulty concentrating, remembering, or making decisions?: No Patient able to express need for assistance with ADLs?: Yes Does the  patient have difficulty dressing or bathing?: No Independently performs ADLs?: Yes (appropriate for developmental age) Does the patient have difficulty walking or climbing stairs?: No Weakness of Legs: None Weakness of Arms/Hands: None  Home Assistive Devices/Equipment Home Assistive Devices/Equipment: None    Abuse/Neglect Assessment (Assessment to be complete while patient is alone) Abuse/Neglect Assessment Can Be Completed: Yes Physical Abuse: Denies Verbal Abuse: Denies Sexual Abuse: Denies Exploitation of patient/patient's resources: Denies Self-Neglect: Denies     Merchant navy officer (For Healthcare) Does Patient Have a Medical Advance Directive?: No          Disposition: Per Denzil Magnuson, NP, patient does not meet criteria for psychiatric inpatient admission. Resources for the [facility] provided for medication management and therapy. Patient and mother was encouraged to follow-up with resources for establishment of care.  Disposition Initial Assessment Completed for this Encounter: Yes Disposition of Patient:  (Referred to the [facility] for outpatient services ) Patient referred to: Outpatient clinic referral [facility])  On Site Evaluation by:   Reviewed with Physician:    Melynda Ripple 04/01/2020 2:25 PM

## 2020-06-15 ENCOUNTER — Emergency Department (HOSPITAL_COMMUNITY): Payer: Self-pay

## 2020-06-15 ENCOUNTER — Inpatient Hospital Stay (HOSPITAL_COMMUNITY): Payer: Self-pay

## 2020-06-15 ENCOUNTER — Encounter (HOSPITAL_COMMUNITY): Payer: Self-pay

## 2020-06-15 ENCOUNTER — Other Ambulatory Visit: Payer: Self-pay

## 2020-06-15 ENCOUNTER — Inpatient Hospital Stay (HOSPITAL_COMMUNITY)
Admission: EM | Admit: 2020-06-15 | Discharge: 2020-07-03 | DRG: 917 | Disposition: E | Payer: Self-pay | Attending: Internal Medicine | Admitting: Internal Medicine

## 2020-06-15 DIAGNOSIS — D689 Coagulation defect, unspecified: Secondary | ICD-10-CM | POA: Diagnosis present

## 2020-06-15 DIAGNOSIS — M6282 Rhabdomyolysis: Secondary | ICD-10-CM | POA: Diagnosis present

## 2020-06-15 DIAGNOSIS — N19 Unspecified kidney failure: Secondary | ICD-10-CM

## 2020-06-15 DIAGNOSIS — I82413 Acute embolism and thrombosis of femoral vein, bilateral: Secondary | ICD-10-CM | POA: Diagnosis present

## 2020-06-15 DIAGNOSIS — N179 Acute kidney failure, unspecified: Secondary | ICD-10-CM

## 2020-06-15 DIAGNOSIS — A419 Sepsis, unspecified organism: Secondary | ICD-10-CM | POA: Diagnosis not present

## 2020-06-15 DIAGNOSIS — F32A Depression, unspecified: Secondary | ICD-10-CM | POA: Diagnosis present

## 2020-06-15 DIAGNOSIS — R0989 Other specified symptoms and signs involving the circulatory and respiratory systems: Secondary | ICD-10-CM

## 2020-06-15 DIAGNOSIS — L89100 Pressure ulcer of unspecified part of back, unstageable: Secondary | ICD-10-CM | POA: Diagnosis present

## 2020-06-15 DIAGNOSIS — R739 Hyperglycemia, unspecified: Secondary | ICD-10-CM | POA: Diagnosis not present

## 2020-06-15 DIAGNOSIS — G931 Anoxic brain damage, not elsewhere classified: Secondary | ICD-10-CM | POA: Diagnosis present

## 2020-06-15 DIAGNOSIS — F17210 Nicotine dependence, cigarettes, uncomplicated: Secondary | ICD-10-CM | POA: Diagnosis present

## 2020-06-15 DIAGNOSIS — E875 Hyperkalemia: Secondary | ICD-10-CM | POA: Diagnosis present

## 2020-06-15 DIAGNOSIS — Z20822 Contact with and (suspected) exposure to covid-19: Secondary | ICD-10-CM | POA: Diagnosis present

## 2020-06-15 DIAGNOSIS — J9601 Acute respiratory failure with hypoxia: Secondary | ICD-10-CM | POA: Diagnosis present

## 2020-06-15 DIAGNOSIS — H5704 Mydriasis: Secondary | ICD-10-CM | POA: Diagnosis present

## 2020-06-15 DIAGNOSIS — R0689 Other abnormalities of breathing: Secondary | ICD-10-CM

## 2020-06-15 DIAGNOSIS — Z515 Encounter for palliative care: Secondary | ICD-10-CM

## 2020-06-15 DIAGNOSIS — D696 Thrombocytopenia, unspecified: Secondary | ICD-10-CM

## 2020-06-15 DIAGNOSIS — R069 Unspecified abnormalities of breathing: Secondary | ICD-10-CM

## 2020-06-15 DIAGNOSIS — E860 Dehydration: Secondary | ICD-10-CM | POA: Diagnosis present

## 2020-06-15 DIAGNOSIS — D6959 Other secondary thrombocytopenia: Secondary | ICD-10-CM | POA: Diagnosis present

## 2020-06-15 DIAGNOSIS — N170 Acute kidney failure with tubular necrosis: Secondary | ICD-10-CM | POA: Diagnosis present

## 2020-06-15 DIAGNOSIS — G9341 Metabolic encephalopathy: Secondary | ICD-10-CM | POA: Diagnosis present

## 2020-06-15 DIAGNOSIS — R6521 Severe sepsis with septic shock: Secondary | ICD-10-CM | POA: Diagnosis not present

## 2020-06-15 DIAGNOSIS — L89890 Pressure ulcer of other site, unstageable: Secondary | ICD-10-CM | POA: Diagnosis present

## 2020-06-15 DIAGNOSIS — R4020 Unspecified coma: Secondary | ICD-10-CM | POA: Diagnosis present

## 2020-06-15 DIAGNOSIS — I82443 Acute embolism and thrombosis of tibial vein, bilateral: Secondary | ICD-10-CM | POA: Diagnosis present

## 2020-06-15 DIAGNOSIS — T43621A Poisoning by amphetamines, accidental (unintentional), initial encounter: Principal | ICD-10-CM | POA: Diagnosis present

## 2020-06-15 DIAGNOSIS — F1910 Other psychoactive substance abuse, uncomplicated: Secondary | ICD-10-CM | POA: Diagnosis present

## 2020-06-15 DIAGNOSIS — I82433 Acute embolism and thrombosis of popliteal vein, bilateral: Secondary | ICD-10-CM | POA: Diagnosis present

## 2020-06-15 DIAGNOSIS — E872 Acidosis: Secondary | ICD-10-CM | POA: Diagnosis present

## 2020-06-15 DIAGNOSIS — Z66 Do not resuscitate: Secondary | ICD-10-CM | POA: Diagnosis not present

## 2020-06-15 DIAGNOSIS — I96 Gangrene, not elsewhere classified: Secondary | ICD-10-CM | POA: Diagnosis present

## 2020-06-15 DIAGNOSIS — L89620 Pressure ulcer of left heel, unstageable: Secondary | ICD-10-CM | POA: Diagnosis present

## 2020-06-15 DIAGNOSIS — R4189 Other symptoms and signs involving cognitive functions and awareness: Secondary | ICD-10-CM

## 2020-06-15 DIAGNOSIS — E870 Hyperosmolality and hypernatremia: Secondary | ICD-10-CM | POA: Diagnosis present

## 2020-06-15 DIAGNOSIS — R451 Restlessness and agitation: Secondary | ICD-10-CM | POA: Diagnosis present

## 2020-06-15 DIAGNOSIS — I82453 Acute embolism and thrombosis of peroneal vein, bilateral: Secondary | ICD-10-CM | POA: Diagnosis present

## 2020-06-15 LAB — ETHANOL: Alcohol, Ethyl (B): <10 mg/dL (ref <10)

## 2020-06-15 LAB — PROTIME-INR
INR: 2.2 — ABNORMAL HIGH (ref 0.8–1.2)
INR: 2 (ref 0.8–1.2)
Prothrombin Time: 23.4 seconds — ABNORMAL HIGH (ref 11.4–15.2)
Prothrombin Time: 22.3 seconds (ref 11.4–15.2)

## 2020-06-15 LAB — CBC
HCT: 49.2 % (ref 39.0–52.0)
Hemoglobin: 16 g/dL (ref 13.0–17.0)
MCH: 30.9 pg (ref 26.0–34.0)
MCHC: 32.5 g/dL (ref 30.0–36.0)
MCV: 95.2 fL (ref 80.0–100.0)
Platelets: 58 10*3/uL — ABNORMAL LOW (ref 150–400)
RBC: 5.17 MIL/uL (ref 4.22–5.81)
RDW: 13.3 % (ref 11.5–15.5)
WBC: 17.6 10*3/uL — ABNORMAL HIGH (ref 4.0–10.5)
nRBC: 0.4 % — ABNORMAL HIGH (ref 0.0–0.2)

## 2020-06-15 LAB — BASIC METABOLIC PANEL
Anion gap: 27 — ABNORMAL HIGH (ref 5–15)
BUN: 295 mg/dL — ABNORMAL HIGH (ref 6–20)
CO2: 17 mmol/L — ABNORMAL LOW (ref 22–32)
Calcium: 7.9 mg/dL — ABNORMAL LOW (ref 8.9–10.3)
Chloride: 114 mmol/L — ABNORMAL HIGH (ref 98–111)
Creatinine, Ser: 14.5 mg/dL — ABNORMAL HIGH (ref 0.61–1.24)
GFR, Estimated: 4 mL/min — ABNORMAL LOW (ref >60)
Glucose, Bld: 139 mg/dL — ABNORMAL HIGH (ref 70–99)
Potassium: 6.4 mmol/L (ref 3.5–5.1)
Sodium: 158 mmol/L — ABNORMAL HIGH (ref 135–145)

## 2020-06-15 LAB — I-STAT CHEM 8, ED
BUN: >130 mg/dL — ABNORMAL HIGH (ref 6–20)
Calcium, Ion: 0.85 mmol/L — CL (ref 1.15–1.40)
Chloride: 120 mmol/L — ABNORMAL HIGH (ref 98–111)
Creatinine, Ser: 17 mg/dL — ABNORMAL HIGH (ref 0.61–1.24)
Glucose, Bld: 122 mg/dL — ABNORMAL HIGH (ref 70–99)
HCT: 47 % (ref 39.0–52.0)
Hemoglobin: 16 g/dL (ref 13.0–17.0)
Potassium: 7.5 mmol/L (ref 3.5–5.1)
Sodium: 155 mmol/L — ABNORMAL HIGH (ref 135–145)
TCO2: 16 mmol/L — ABNORMAL LOW (ref 22–32)

## 2020-06-15 LAB — I-STAT ARTERIAL BLOOD GAS, ED
Acid-base deficit: 8 mmol/L — ABNORMAL HIGH (ref 0.0–2.0)
Acid-base deficit: 16 mmol/L — ABNORMAL HIGH (ref 0.0–2.0)
Bicarbonate: 17 mmol/L — ABNORMAL LOW (ref 20.0–28.0)
Bicarbonate: 9.1 mmol/L — ABNORMAL LOW (ref 20.0–28.0)
Calcium, Ion: 0.94 mmol/L — ABNORMAL LOW (ref 1.15–1.40)
Calcium, Ion: 0.63 mmol/L — CL (ref 1.15–1.40)
HCT: 42 % (ref 39.0–52.0)
HCT: 19 % — ABNORMAL LOW (ref 39.0–52.0)
Hemoglobin: 14.3 g/dL (ref 13.0–17.0)
Hemoglobin: 6.5 g/dL — CL (ref 13.0–17.0)
O2 Saturation: 100 %
O2 Saturation: 100 %
Potassium: 6.9 mmol/L (ref 3.5–5.1)
Potassium: 2.8 mmol/L — ABNORMAL LOW (ref 3.5–5.1)
Sodium: 155 mmol/L — ABNORMAL HIGH (ref 135–145)
Sodium: 161 mmol/L (ref 135–145)
TCO2: 18 mmol/L — ABNORMAL LOW (ref 22–32)
TCO2: 10 mmol/L — ABNORMAL LOW (ref 22–32)
pCO2 arterial: 33.2 mmHg (ref 32.0–48.0)
pCO2 arterial: 17.9 mmHg — CL (ref 32.0–48.0)
pH, Arterial: 7.318 — ABNORMAL LOW (ref 7.350–7.450)
pH, Arterial: 7.315 — ABNORMAL LOW (ref 7.350–7.450)
pO2, Arterial: 363 mmHg — ABNORMAL HIGH (ref 83.0–108.0)
pO2, Arterial: 184 mmHg — ABNORMAL HIGH (ref 83.0–108.0)

## 2020-06-15 LAB — CBC WITH DIFFERENTIAL/PLATELET
Abs Immature Granulocytes: 0.15 10*3/uL — ABNORMAL HIGH (ref 0.00–0.07)
Basophils Absolute: 0 10*3/uL (ref 0.0–0.1)
Basophils Relative: 0 %
Eosinophils Absolute: 0 10*3/uL (ref 0.0–0.5)
Eosinophils Relative: 0 %
HCT: 50.7 % (ref 39.0–52.0)
Hemoglobin: 16.1 g/dL (ref 13.0–17.0)
Immature Granulocytes: 1 %
Lymphocytes Relative: 4 %
Lymphs Abs: 0.6 10*3/uL — ABNORMAL LOW (ref 0.7–4.0)
MCH: 31.4 pg (ref 26.0–34.0)
MCHC: 31.8 g/dL (ref 30.0–36.0)
MCV: 98.8 fL (ref 80.0–100.0)
Monocytes Absolute: 1.2 10*3/uL — ABNORMAL HIGH (ref 0.1–1.0)
Monocytes Relative: 9 %
Neutro Abs: 12.5 10*3/uL — ABNORMAL HIGH (ref 1.7–7.7)
Neutrophils Relative %: 86 %
Platelets: 66 10*3/uL — ABNORMAL LOW (ref 150–400)
RBC: 5.13 MIL/uL (ref 4.22–5.81)
RDW: 13.4 % (ref 11.5–15.5)
WBC: 14.5 10*3/uL — ABNORMAL HIGH (ref 4.0–10.5)
nRBC: 0.6 % — ABNORMAL HIGH (ref 0.0–0.2)

## 2020-06-15 LAB — POCT I-STAT 7, (LYTES, BLD GAS, ICA,H+H)
Acid-base deficit: 6 mmol/L — ABNORMAL HIGH (ref 0.0–2.0)
Bicarbonate: 18.8 mmol/L — ABNORMAL LOW (ref 20.0–28.0)
Calcium, Ion: 1 mmol/L — ABNORMAL LOW (ref 1.15–1.40)
HCT: 46 % (ref 39.0–52.0)
Hemoglobin: 15.6 g/dL (ref 13.0–17.0)
O2 Saturation: 95 %
Patient temperature: 36.2
Potassium: 6.4 mmol/L (ref 3.5–5.1)
Sodium: 157 mmol/L — ABNORMAL HIGH (ref 135–145)
TCO2: 20 mmol/L — ABNORMAL LOW (ref 22–32)
pCO2 arterial: 33.2 mmHg (ref 32.0–48.0)
pH, Arterial: 7.357 (ref 7.350–7.450)
pO2, Arterial: 76 mmHg — ABNORMAL LOW (ref 83.0–108.0)

## 2020-06-15 LAB — ECHOCARDIOGRAM COMPLETE
AR max vel: 2.2 cm2
AV Area VTI: 2.16 cm2
AV Area mean vel: 2.05 cm2
AV Mean grad: 3 mmHg
AV Peak grad: 4.3 mmHg
Ao pk vel: 1.04 m/s
Area-P 1/2: 3.72 cm2
Height: 74 in
S' Lateral: 1.8 cm
Weight: 3200 oz

## 2020-06-15 LAB — LACTIC ACID, PLASMA
Lactic Acid, Venous: 3.1 mmol/L (ref 0.5–1.9)
Lactic Acid, Venous: 2 mmol/L (ref 0.5–1.9)

## 2020-06-15 LAB — MRSA PCR SCREENING: MRSA by PCR: NEGATIVE

## 2020-06-15 LAB — SALICYLATE LEVEL: Salicylate Lvl: <7 mg/dL — ABNORMAL LOW (ref 7.0–30.0)

## 2020-06-15 LAB — URINALYSIS, ROUTINE W REFLEX MICROSCOPIC
Bilirubin Urine: NEGATIVE
Glucose, UA: NEGATIVE mg/dL
Hgb urine dipstick: NEGATIVE
Ketones, ur: NEGATIVE mg/dL
Leukocytes,Ua: NEGATIVE
Nitrite: NEGATIVE
Protein, ur: NEGATIVE mg/dL
Specific Gravity, Urine: 1.02 (ref 1.005–1.030)
pH: 5 (ref 5.0–8.0)

## 2020-06-15 LAB — ACETAMINOPHEN LEVEL: Acetaminophen (Tylenol), Serum: <10 ug/mL — ABNORMAL LOW (ref 10–30)

## 2020-06-15 LAB — D-DIMER, QUANTITATIVE: D-Dimer, Quant: >20 ug/mL-FEU (ref 0.00–0.50)

## 2020-06-15 LAB — DIC (DISSEMINATED INTRAVASCULAR COAGULATION)PANEL
D-Dimer, Quant: >20 ug/mL-FEU — ABNORMAL HIGH (ref 0.00–0.50)
Fibrinogen: 63 mg/dL — CL (ref 210–475)
INR: 2 — ABNORMAL HIGH (ref 0.8–1.2)
Platelets: 58 10*3/uL — ABNORMAL LOW (ref 150–400)
Prothrombin Time: 22.2 seconds — ABNORMAL HIGH (ref 11.4–15.2)
Smear Review: NONE SEEN
aPTT: 33 seconds (ref 24–36)

## 2020-06-15 LAB — RAPID URINE DRUG SCREEN, HOSP PERFORMED
Amphetamines: POSITIVE — AB
Barbiturates: NOT DETECTED
Benzodiazepines: NOT DETECTED
Cocaine: NOT DETECTED
Opiates: NOT DETECTED
Tetrahydrocannabinol: NOT DETECTED

## 2020-06-15 LAB — TROPONIN I (HIGH SENSITIVITY)
Troponin I (High Sensitivity): 7 ng/L (ref <18)
Troponin I (High Sensitivity): 6 ng/L (ref <18)

## 2020-06-15 LAB — AMMONIA: Ammonia: 41 umol/L — ABNORMAL HIGH (ref 9–35)

## 2020-06-15 LAB — CBG MONITORING, ED: Glucose-Capillary: 127 mg/dL — ABNORMAL HIGH (ref 70–99)

## 2020-06-15 LAB — PHOSPHORUS: Phosphorus: >30 mg/dL — ABNORMAL HIGH (ref 2.5–4.6)

## 2020-06-15 LAB — APTT: aPTT: 32 seconds (ref 24–36)

## 2020-06-15 LAB — HIV ANTIBODY (ROUTINE TESTING W REFLEX): HIV Screen 4th Generation wRfx: NONREACTIVE

## 2020-06-15 LAB — RESPIRATORY PANEL BY RT PCR (FLU A&B, COVID)
Influenza A by PCR: NEGATIVE
Influenza B by PCR: NEGATIVE
SARS Coronavirus 2 by RT PCR: NEGATIVE

## 2020-06-15 LAB — MAGNESIUM: Magnesium: 4.1 mg/dL — ABNORMAL HIGH (ref 1.7–2.4)

## 2020-06-15 LAB — CK: Total CK: 2353 U/L — ABNORMAL HIGH (ref 49–397)

## 2020-06-15 MED ORDER — CALCIUM GLUCONATE-NACL 1-0.675 GM/50ML-% IV SOLN
1.0000 g | Freq: Once | INTRAVENOUS | Status: AC
  Administered 2020-06-15: 1000 mg via INTRAVENOUS
  Filled 2020-06-15: qty 50

## 2020-06-15 MED ORDER — SODIUM CHLORIDE 0.9 % IV SOLN
2.0000 g | Freq: Once | INTRAVENOUS | Status: AC
  Administered 2020-06-15: 2 g via INTRAVENOUS
  Filled 2020-06-15: qty 20

## 2020-06-15 MED ORDER — HEPARIN SODIUM (PORCINE) 1000 UNIT/ML DIALYSIS
1000.0000 [IU] | INTRAMUSCULAR | Status: DC | PRN
  Administered 2020-06-19: 2000 [IU] via INTRAVENOUS_CENTRAL
  Filled 2020-06-15: qty 2
  Filled 2020-06-15: qty 6
  Filled 2020-06-15: qty 4
  Filled 2020-06-15: qty 6

## 2020-06-15 MED ORDER — ROCURONIUM BROMIDE 50 MG/5ML IV SOLN
INTRAVENOUS | Status: AC | PRN
  Administered 2020-06-15: 100 mg via INTRAVENOUS

## 2020-06-15 MED ORDER — PHENYLEPHRINE HCL (PRESSORS) 10 MG/ML IV SOLN
INTRAVENOUS | Status: AC | PRN
  Administered 2020-06-15: 80 ug via INTRAVENOUS

## 2020-06-15 MED ORDER — SODIUM CHLORIDE 0.9 % IV SOLN
INTRAVENOUS | Status: AC | PRN
  Administered 2020-06-15: 1000 mL via INTRAVENOUS

## 2020-06-15 MED ORDER — ORAL CARE MOUTH RINSE
15.0000 mL | OROMUCOSAL | Status: DC
  Administered 2020-06-15 – 2020-06-25 (×96): 15 mL via OROMUCOSAL

## 2020-06-15 MED ORDER — MIDAZOLAM HCL 2 MG/2ML IJ SOLN
2.0000 mg | INTRAMUSCULAR | Status: DC | PRN
  Administered 2020-06-16: 2 mg via INTRAVENOUS
  Filled 2020-06-15: qty 2

## 2020-06-15 MED ORDER — POLYETHYLENE GLYCOL 3350 17 G PO PACK: 17.0000 g | PACK | Freq: Every day | ORAL | Status: DC | PRN

## 2020-06-15 MED ORDER — PRISMASOL BGK 0/2.5 32-2.5 MEQ/L EC SOLN
Status: DC
  Filled 2020-06-15 (×2): qty 5000

## 2020-06-15 MED ORDER — VANCOMYCIN HCL 2000 MG/400ML IV SOLN
2000.0000 mg | Freq: Once | INTRAVENOUS | Status: AC
  Administered 2020-06-15: 2000 mg via INTRAVENOUS
  Filled 2020-06-15 (×2): qty 400

## 2020-06-15 MED ORDER — ETOMIDATE 2 MG/ML IV SOLN
INTRAVENOUS | Status: AC | PRN
  Administered 2020-06-15: 20 mg via INTRAVENOUS

## 2020-06-15 MED ORDER — PHENYLEPHRINE 40 MCG/ML (10ML) SYRINGE FOR IV PUSH (FOR BLOOD PRESSURE SUPPORT)
PREFILLED_SYRINGE | INTRAVENOUS | Status: AC
  Filled 2020-06-15: qty 10

## 2020-06-15 MED ORDER — NALOXONE HCL 2 MG/2ML IJ SOSY
PREFILLED_SYRINGE | INTRAMUSCULAR | Status: AC | PRN
  Administered 2020-06-15: 2 mg via INTRAVENOUS

## 2020-06-15 MED ORDER — DOCUSATE SODIUM 100 MG PO CAPS: 100.0000 mg | ORAL_CAPSULE | Freq: Two times a day (BID) | ORAL | Status: DC | PRN

## 2020-06-15 MED ORDER — HEPARIN (PORCINE) 2000 UNITS/L FOR CRRT
INTRAVENOUS_CENTRAL | Status: DC | PRN
  Filled 2020-06-15: qty 1000

## 2020-06-15 MED ORDER — SODIUM CHLORIDE 0.9% FLUSH
10.0000 mL | Freq: Two times a day (BID) | INTRAVENOUS | Status: DC
  Administered 2020-06-16 – 2020-06-25 (×16): 10 mL

## 2020-06-15 MED ORDER — ONDANSETRON HCL 4 MG/2ML IJ SOLN: 4.0000 mg | Freq: Four times a day (QID) | INTRAMUSCULAR | Status: DC | PRN

## 2020-06-15 MED ORDER — STERILE WATER FOR INJECTION IV SOLN
INTRAVENOUS | Status: DC
  Filled 2020-06-15 (×3): qty 150

## 2020-06-15 MED ORDER — SODIUM CHLORIDE 0.9% FLUSH
10.0000 mL | INTRAVENOUS | Status: DC | PRN
  Administered 2020-06-15 – 2020-06-18 (×2): 10 mL

## 2020-06-15 MED ORDER — SODIUM CHLORIDE 0.9 % IV BOLUS
1000.0000 mL | Freq: Once | INTRAVENOUS | Status: AC
  Administered 2020-06-15: 1000 mL via INTRAVENOUS

## 2020-06-15 MED ORDER — PHENYLEPHRINE HCL-NACL 10-0.9 MG/250ML-% IV SOLN
0.0000 ug/min | INTRAVENOUS | Status: DC
  Administered 2020-06-15: 20 ug/min via INTRAVENOUS
  Administered 2020-06-16: 90 ug/min via INTRAVENOUS
  Administered 2020-06-16 (×2): 60 ug/min via INTRAVENOUS
  Filled 2020-06-15: qty 250
  Filled 2020-06-15: qty 500

## 2020-06-15 MED ORDER — PHENYLEPHRINE 40 MCG/ML (10ML) SYRINGE FOR IV PUSH (FOR BLOOD PRESSURE SUPPORT)
PREFILLED_SYRINGE | INTRAVENOUS | Status: AC | PRN
  Administered 2020-06-15: 80 ug via INTRAVENOUS

## 2020-06-15 MED ORDER — DEXTROSE 50 % IV SOLN
1.0000 | Freq: Once | INTRAVENOUS | Status: AC
  Administered 2020-06-15: 50 mL via INTRAVENOUS
  Filled 2020-06-15: qty 50

## 2020-06-15 MED ORDER — CHLORHEXIDINE GLUCONATE CLOTH 2 % EX PADS: 6.0000 | MEDICATED_PAD | Freq: Every day | CUTANEOUS | Status: DC

## 2020-06-15 MED ORDER — MIDAZOLAM HCL 2 MG/2ML IJ SOLN
2.0000 mg | INTRAMUSCULAR | Status: AC | PRN
  Administered 2020-06-15 – 2020-06-16 (×3): 2 mg via INTRAVENOUS
  Filled 2020-06-15 (×3): qty 2

## 2020-06-15 MED ORDER — FENTANYL CITRATE (PF) 100 MCG/2ML IJ SOLN
INTRAMUSCULAR | Status: AC
  Filled 2020-06-15: qty 2

## 2020-06-15 MED ORDER — PRISMASOL BGK 0/2.5 32-2.5 MEQ/L EC SOLN
Status: DC
  Filled 2020-06-15 (×16): qty 5000

## 2020-06-15 MED ORDER — FENTANYL CITRATE (PF) 100 MCG/2ML IJ SOLN: 100.0000 ug | INTRAMUSCULAR | Status: DC | PRN

## 2020-06-15 MED ORDER — LACTATED RINGERS IV SOLN
INTRAVENOUS | Status: AC | PRN
  Administered 2020-06-15: 1000 mL via INTRAVENOUS

## 2020-06-15 MED ORDER — FENTANYL CITRATE (PF) 100 MCG/2ML IJ SOLN
100.0000 ug | INTRAMUSCULAR | Status: AC | PRN
  Administered 2020-06-15 – 2020-06-16 (×3): 100 ug via INTRAVENOUS
  Filled 2020-06-15: qty 2

## 2020-06-15 MED ORDER — SODIUM CHLORIDE 0.9 % IV SOLN
1.0000 g | Freq: Once | INTRAVENOUS | Status: AC
  Administered 2020-06-15: 1 g via INTRAVENOUS
  Filled 2020-06-15 (×2): qty 10

## 2020-06-15 MED ORDER — SODIUM BICARBONATE 8.4 % IV SOLN
50.0000 meq | Freq: Once | INTRAVENOUS | Status: AC
  Administered 2020-06-15: 50 meq via INTRAVENOUS
  Filled 2020-06-15: qty 50

## 2020-06-15 MED ORDER — INSULIN ASPART 100 UNIT/ML IV SOLN
5.0000 [IU] | Freq: Once | INTRAVENOUS | Status: AC
  Administered 2020-06-15: 5 [IU] via INTRAVENOUS

## 2020-06-15 MED ORDER — SODIUM CHLORIDE 0.9 % IV SOLN
2.0000 g | Freq: Two times a day (BID) | INTRAVENOUS | Status: DC
  Administered 2020-06-15 – 2020-06-20 (×10): 2 g via INTRAVENOUS
  Filled 2020-06-15 (×10): qty 2

## 2020-06-15 MED ORDER — VANCOMYCIN HCL IN DEXTROSE 1-5 GM/200ML-% IV SOLN
1000.0000 mg | INTRAVENOUS | Status: DC
  Administered 2020-06-16: 1000 mg via INTRAVENOUS
  Filled 2020-06-15: qty 200

## 2020-06-15 MED ORDER — HEPARIN SODIUM (PORCINE) 1000 UNIT/ML DIALYSIS: 1000.0000 [IU] | INTRAMUSCULAR | Status: DC | PRN

## 2020-06-15 MED ORDER — SODIUM CHLORIDE 0.9 % IV SOLN
INTRAVENOUS | Status: DC
  Administered 2020-06-16: 125 mL/h via INTRAVENOUS

## 2020-06-15 MED ORDER — PANTOPRAZOLE SODIUM 40 MG IV SOLR
40.0000 mg | Freq: Every day | INTRAVENOUS | Status: DC
  Administered 2020-06-15 – 2020-06-16 (×2): 40 mg via INTRAVENOUS
  Filled 2020-06-15 (×2): qty 40

## 2020-06-15 MED ORDER — CHLORHEXIDINE GLUCONATE 0.12% ORAL RINSE (MEDLINE KIT)
15.0000 mL | Freq: Two times a day (BID) | OROMUCOSAL | Status: DC
  Administered 2020-06-15 – 2020-06-25 (×20): 15 mL via OROMUCOSAL
  Filled 2020-06-15 (×2): qty 15

## 2020-06-15 MED ORDER — CHLORHEXIDINE GLUCONATE CLOTH 2 % EX PADS
6.0000 | MEDICATED_PAD | Freq: Every day | CUTANEOUS | Status: DC
  Administered 2020-06-16 – 2020-06-18 (×3): 6 via TOPICAL

## 2020-06-15 NOTE — Progress Notes (Signed)
CT head reviewed, MRI ordered for better characterization of cerebellar abnormality.  Will ask neuro to see.  I am going to go ahead and order vanc/zosyn until we get echo and blood culture data.  Myrla Halsted MD PCCM

## 2020-06-15 NOTE — Progress Notes (Signed)
eLink Physician-Brief Progress Note Patient Name: Derek Jennings DOB: 11-23-1985 MRN: [id number]   Date of Service  2020/06/22  HPI/Events of Note  Hypotension - BP = 70/45 with MAP = 55.   eICU Interventions  Plan: 1. Monitor CVP now and Q 4 hours. 2. Phenylephrine IV infusion. Titrate to MAP >= 65.      Intervention Category Major Interventions: Hypotension - evaluation and management  Lenell Antu 06-22-2020, 8:38 PM

## 2020-06-15 NOTE — ED Provider Notes (Signed)
[name]PART[facility] Provider Note   CSN: [id number] Arrival date & time: 06/17/2020  1028     History No chief complaint on file.   Derek Jennings is a 34 y.o. male.  The history is provided by the EMS personnel.  Illness Location:  Unresponsive  Severity:  Severe Onset quality:  Unable to specify Duration: unclear duration, last seen 1 week ago. Timing:  Constant Progression:  Unchanged Chronicity:  New Ear pain: hx drug use and depression     34 yo male with history of ADD and polysubstance abuse presenting after being found down at home. Pt was last seen by family 1 week ago, they report pt has hx of depression and drug use. EMS report pt was found in bed, cool to touch, unresponsive, GCS3. Pupil dilated, increased WOB, pt bagged en route.     Past Medical History:  Diagnosis Date  . ADD (attention deficit disorder)   . Allergy   . Hyperhidrosis     Patient Active Problem List   Diagnosis Date Noted  . Acute renal failure (ARF) ([place]) 07/02/2020  . Uremic encephalopathy 06/09/2020  . Thrombocytopenia ([place]) 06/24/2020  . Acute respiratory insufficiency 06/22/2020  . Polysubstance abuse ([place]) 12/12/2018  . Moderate protein-calorie malnutrition ([place])   . Left foot infection 12/11/2018  . Hyperglycemia 12/11/2018  . Normocytic anemia 12/11/2018  . Cellulitis and abscess of foot 12/10/2018  . Sepsis ([place]) 12/10/2018  . Wound infection 12/10/2018  . Allergic rhinitis 12/11/2011  . Hyperhidrosis 08/01/2011    Past Surgical History:  Procedure Laterality Date  . ADENOIDECTOMY    . APPLICATION OF WOUND VAC Left 12/13/2018   Procedure: Application Of Wound Vac;  Surgeon: Newt Minion, MD;  Location: [facility];  Service: Orthopedics;  Laterality: Left;  . I & D EXTREMITY Left 12/10/2018   Procedure: IRRIGATION AND DEBRIDEMENT EXTREMITY placement of wound vac;  Surgeon: Netta Cedars, MD;  Location: [facility];  Service: Orthopedics;  Laterality:  Left;  . I & D EXTREMITY Left 12/13/2018   Procedure: LEFT FOOT DEBRIDEMENT AND SPLIT THICKNESS SKIN GRAFT;  Surgeon: Newt Minion, MD;  Location: [facility];  Service: Orthopedics;  Laterality: Left;       Family History  Problem Relation Age of Onset  . Hypertension Mother   . Heart disease Father   . Stroke Father   . Diabetes Other     Social History   Tobacco Use  . Smoking status: Current Every Day Smoker    Packs/day: 0.20  . Smokeless tobacco: Former Network engineer Use Topics  . Alcohol use: Yes    Comment: socially  . Drug use: No    Home Medications Prior to Admission medications   Medication Sig Start Date End Date Taking? Authorizing Provider  acetaminophen (TYLENOL) 325 MG tablet Take 1-2 tablets (325-650 mg total) by mouth every 6 (six) hours as needed for mild pain (pain score 1-3 or temp > 100.5). 12/14/18   Domenic Polite, MD  famotidine (PEPCID) 10 MG tablet Take 10 mg by mouth 3 (three) times daily.    [provider]  ibuprofen (ADVIL) 200 MG tablet Take 800-1,000 mg by mouth every 6 (six) hours as needed for moderate pain.    [provider]  loratadine (CLARITIN) 10 MG tablet Take 10 mg by mouth daily.    [provider]  Melatonin 5 MG TABS Take 5 mg by mouth at bedtime as needed (sleep).    [provider]  oxyCODONE (OXY IR/ROXICODONE) 5 MG immediate release tablet Take 1 tablet (5 mg total) by mouth every 6 (six) hours as needed for moderate pain. 12/14/18   Domenic Polite, MD  silver sulfADIAZINE (SILVADENE) 1 % cream Apply 1 application topically daily. Apply to left foot daily 01/05/19   Rayburn, Neta Mends, PA-C  vitamin C (ASCORBIC ACID) 500 MG tablet Take 1,500 mg by mouth daily.    [provider]    Allergies    Amoxicillin and Penicillins  Review of Systems   Review of Systems  Unable to perform ROS: Patient unresponsive  HENT: Ear pain: hx drug use and depression      Physical  Exam Updated Vital Signs BP 120/83   Pulse 88   Temp (!) 96.2 F (35.7 C)   Resp 17   Ht _0  (1.88 m)   Wt 90.7 kg   SpO2 99%   BMI 25.68 kg/m   Physical Exam Vitals reviewed.  Constitutional:      General: He is in acute distress.     Appearance: He is normal weight. He is ill-appearing.  HENT:     Head: Normocephalic and atraumatic.     Mouth/Throat:     Mouth: Mucous membranes are dry.  Eyes:     Conjunctiva/sclera:     Right eye: Right conjunctiva is injected.     Left eye: Left conjunctiva is injected.  Cardiovascular:     Rate and Rhythm: Normal rate.     Heart sounds: Normal heart sounds.  Pulmonary:     Breath sounds: Wheezing and rhonchi present.  Abdominal:     General: Abdomen is flat. There is no distension.     Palpations: Abdomen is soft.  Musculoskeletal:     Right lower leg: No edema.     Left lower leg: No edema.  Skin:    General: Skin is warm.     Findings: Bruising and lesion present.     Comments: Skin with multiple superficial abrasions and sores:  Pressure injury to L arm and hand with pallor and surrounding erythema Darkened discoloration of R face and striation abrasion to R neck L shin with dark ulcerating lesion  Sores in bilateral heels   Neurological:     GCS: GCS eye subscore is 2. GCS verbal subscore is 2. GCS motor subscore is 2.     Motor: No seizure activity.     Comments:  grunting/incomprehensible noises, not following commands, extension on upper extremities in response to pain, eyes not tracking      ED Results / Procedures / Treatments   Labs (all labs ordered are listed, but only abnormal results are displayed) Labs Reviewed  COMPREHENSIVE METABOLIC PANEL - Abnormal; Notable for the following components:      Result Value   Sodium 159 (*)    Potassium >7.5 (*)    Chloride 114 (*)    CO2 16 (*)    Glucose, Bld 136 (*)    BUN 315 (*)    Creatinine, Ser 17.03 (*)    Calcium 7.8 (*)    Total Protein 6.4 (*)     Albumin 3.1 (*)    ALT 184 (*)    Total Bilirubin 1.9 (*)    GFR, Estimated 3 (*)    Anion gap 29 (*)    All other components within normal limits  SALICYLATE LEVEL - Abnormal; Notable for the following components:   Salicylate Lvl <3.3 (*)  All other components within normal limits  ACETAMINOPHEN LEVEL - Abnormal; Notable for the following components:   Acetaminophen (Tylenol), Serum <10 (*)    All other components within normal limits  RAPID URINE DRUG SCREEN, HOSP PERFORMED - Abnormal; Notable for the following components:   Amphetamines POSITIVE (*)    All other components within normal limits  CBC WITH DIFFERENTIAL/PLATELET - Abnormal; Notable for the following components:   WBC 14.5 (*)    Platelets 66 (*)    nRBC 0.6 (*)    Neutro Abs 12.5 (*)    Lymphs Abs 0.6 (*)    Monocytes Absolute 1.2 (*)    Abs Immature Granulocytes 0.15 (*)    All other components within normal limits  CK - Abnormal; Notable for the following components:   Total CK 2,353 (*)    All other components within normal limits  LACTIC ACID, PLASMA - Abnormal; Notable for the following components:   Lactic Acid, Venous 3.1 (*)    All other components within normal limits  URINALYSIS, ROUTINE W REFLEX MICROSCOPIC - Abnormal; Notable for the following components:   Color, Urine AMBER (*)    All other components within normal limits  PROTIME-INR - Abnormal; Notable for the following components:   Prothrombin Time 23.4 (*)    INR 2.2 (*)    All other components within normal limits  CBG MONITORING, ED - Abnormal; Notable for the following components:   Glucose-Capillary 127 (*)    All other components within normal limits  I-STAT CHEM 8, ED - Abnormal; Notable for the following components:   Sodium 155 (*)    Potassium 7.5 (*)    Chloride 120 (*)    BUN >130 (*)    Creatinine, Ser 17.00 (*)    Glucose, Bld 122 (*)    Calcium, Ion 0.85 (*)    TCO2 16 (*)    All other components within normal limits   I-STAT ARTERIAL BLOOD GAS, ED - Abnormal; Notable for the following components:   pH, Arterial 7.318 (*)    pO2, Arterial 363 (*)    Bicarbonate 17.0 (*)    TCO2 18 (*)    Acid-base deficit 8.0 (*)    Sodium 155 (*)    Potassium 6.9 (*)    Calcium, Ion 0.94 (*)    All other components within normal limits  I-STAT ARTERIAL BLOOD GAS, ED - Abnormal; Notable for the following components:   pH, Arterial 7.315 (*)    pCO2 arterial 17.9 (*)    pO2, Arterial 184 (*)    Bicarbonate 9.1 (*)    TCO2 10 (*)    Acid-base deficit 16.0 (*)    Sodium 161 (*)    Potassium 2.8 (*)    Calcium, Ion 0.63 (*)    HCT 19.0 (*)    Hemoglobin 6.5 (*)    All other components within normal limits  RESPIRATORY PANEL BY RT PCR (FLU A&B, COVID)  CULTURE, BLOOD (ROUTINE X 2)  CULTURE, BLOOD (ROUTINE X 2)  ETHANOL  HIV ANTIBODY (ROUTINE TESTING W REFLEX)  RENAL FUNCTION PANEL  DIC (DISSEMINATED INTRAVASCULAR COAGULATION) PANEL (NOT AT [facility])  BLOOD GAS, ARTERIAL  CBG MONITORING, ED    EKG EKG Interpretation  Date/Time:  Saturday June 15 2020 10:31:18 EST Ventricular Rate:  92 PR Interval:    QRS Duration: 90 QT Interval:  345 QTC Calculation: 427 R Axis:   49 Text Interpretation: Sinus rhythm Consider left atrial enlargement ST elev, probable normal early  repol pattern No significant change since last tracing Confirmed by Dorie Rank [phone]) on 06/27/2020 11:38:04 AM   Radiology CT Head Wo Contrast  Result Date: 06/25/2020 CLINICAL DATA:  Head trauma.  Mental status change. EXAM: CT HEAD WITHOUT CONTRAST TECHNIQUE: Contiguous axial images were obtained from the base of the skull through the vertex without intravenous contrast. COMPARISON:  None. FINDINGS: Brain: Abnormal area of mixed attenuation, predominantly hypoattenuated with speckles of hyperattenuation within the right cerebellum measures 2.8 cm. There is no evidence of hydrocephalus or mass effect. The ventricular system is normal.  Vascular: No hyperdense vessel or unexpected calcification. Skull: Normal. Negative for fracture or focal lesion. Sinuses/Orbits: Complete opacification of the right maxillary sinus. Partial opacification of the right ethmoid sinus. Normal appearance of the orbits. Other: Right parietal scalp hematoma. IMPRESSION: 1. Abnormal area of mixed attenuation, predominantly hypoattenuated with speckles of hyperattenuation within the right cerebellum measures 2.8 cm. This may represent an acute intraparenchymal hemorrhage, a hemorrhagic infarct, or potentially a hemorrhagic mass. Further evaluation with MRI of the brain may be considered. 2. Right parietal scalp hematoma. 3. Right maxillary and ethmoid sinusitis. Critical Value/emergent results were called by telephone at the time of interpretation on 06/05/2020 at 3:03 pm to provider Dr. Gwenyth Ober, who verbally acknowledged these results. Electronically Signed   By: Fidela Salisbury M.D.   On: 06/03/2020 15:04   DG Chest Portable 1 View  Result Date: 06/14/2020 CLINICAL DATA:  Endotracheal tube placement, respiratory failure EXAM: PORTABLE CHEST 1 VIEW COMPARISON:  On 07/02/2020 at 10:44 a.m. FINDINGS: Left IJ line tip: Brachiocephalic vein confluence. Endotracheal tube tip is 4.5 cm above the carina and satisfactorily positioned. A nasogastric tube enters the stomach. The patient is rotated to the right on today's radiograph, reducing diagnostic sensitivity and specificity. No pneumothorax. The lungs appear clear. No blunting of the costophrenic angles. IMPRESSION: 1. Left IJ line tip: Brachiocephalic vein confluence. 2. Endotracheal tube tip 4.5 cm above the carina and satisfactorily positioned. Electronically Signed   By: Van Clines M.D.   On: 06/14/2020 13:15   DG Chest Port 1 View  Result Date: 06/30/2020 CLINICAL DATA:  Possible overdose.  Unresponsive. EXAM: PORTABLE CHEST 1 VIEW COMPARISON:  02/09/2014 FINDINGS: Patient is rotated to the  right. Low volume film with mild vascular congestion. Subtle patchy airspace disease noted left base. Cardiopericardial silhouette is at upper limits of normal for size. Left IJ central line tip overlies the innominate vein confluence. No evidence for pneumothorax or pleural effusion. Gaseous distention of bowel in the left abdomen likely stomach and colon. IMPRESSION: Subtle patchy airspace disease at the left base. Otherwise no acute cardiopulmonary findings. Electronically Signed   By: Misty Stanley M.D.   On: 06/11/2020 11:57    Procedures .Central Line  Date/Time: 06/30/2020 11:26 AM Performed by: Roosevelt Locks, MD Authorized by: Dorie Rank, MD   Consent:    Consent obtained:  Emergent situation Pre-procedure details:    Hand hygiene: Hand hygiene performed prior to insertion     Sterile barrier technique: All elements of maximal sterile technique followed     Skin preparation:  2% chlorhexidine   Skin preparation agent: Skin preparation agent completely dried prior to procedure   Anesthesia (see MAR for exact dosages):    Anesthesia method:  Local infiltration   Local anesthetic:  Lidocaine 1% w/o epi Procedure details:    Location:  L internal jugular   Patient position:  Flat   Procedural supplies:  Triple lumen  Landmarks identified: yes     Ultrasound guidance: yes     Sterile ultrasound techniques: Sterile gel and sterile probe covers were used     Number of attempts:  1   Successful placement: yes   Post-procedure details:    Post-procedure:  Dressing applied   Assessment:  Blood return through all ports and placement verified by x-ray   Patient tolerance of procedure:  Tolerated well, no immediate complications  Procedure Name: Intubation Date/Time: 06/10/2020 12:19 PM Performed by: Roosevelt Locks, MD Pre-anesthesia Checklist: Patient identified, Suction available and Patient being monitored Oxygen Delivery Method: Non-rebreather mask Preoxygenation:  Pre-oxygenation with 100% oxygen Induction Type: Rapid sequence Ventilation: Mask ventilation without difficulty and Nasal airway inserted- appropriate to patient size Laryngoscope Size: Mac and 3 Grade View: Grade I Tube size: 7.5 mm Number of attempts: 1 Airway Equipment and Method: Video-laryngoscopy Placement Confirmation: ETT inserted through vocal cords under direct vision,  Positive ETCO2,  Breath sounds checked- equal and bilateral and CO2 detector Secured at: 26 cm Tube secured with: ETT holder      (including critical care time)  Medications Ordered in ED Medications  phenylephrine 0.4-0.9 MG/10ML-% injection (has no administration in time range)  sodium chloride 0.9 % bolus 1,000 mL (0 mLs Intravenous Stopped 06/06/2020 1226)    And  sodium chloride 0.9 % bolus 1,000 mL (0 mLs Intravenous Stopped 06/11/2020 1226)    And  0.9 %  sodium chloride infusion (has no administration in time range)  fentaNYL (SUBLIMAZE) injection 100 mcg (100 mcg Intravenous Given 06/23/2020 1220)  fentaNYL (SUBLIMAZE) injection 100 mcg (has no administration in time range)  midazolam (VERSED) injection 2 mg (has no administration in time range)  midazolam (VERSED) injection 2 mg (has no administration in time range)  docusate sodium (COLACE) capsule 100 mg (has no administration in time range)  polyethylene glycol (MIRALAX / GLYCOLAX) packet 17 g (has no administration in time range)  ondansetron (ZOFRAN) injection 4 mg (has no administration in time range)  pantoprazole (PROTONIX) injection 40 mg (has no administration in time range)  heparin injection 1,000-6,000 Units (has no administration in time range)  heparinized saline (2000 units/L) primer fluid for CRRT (has no administration in time range)  sodium bicarbonate 150 mEq in sterile water 1,000 mL infusion (has no administration in time range)  prismasol BGK 2/2.5 replacement solution (has no administration in time range)  prismasol BGK 2/2.5  dialysis solution (has no administration in time range)  calcium chloride 1 g in sodium chloride 0.9 % 100 mL IVPB (has no administration in time range)  phenylephrine (NEO-SYNEPHRINE) injection (80 mcg Intravenous Given 06/27/2020 1035)  0.9 %  sodium chloride infusion ( Intravenous Stopped 06/20/2020 1139)  lactated ringers infusion ( Intravenous Stopped 07/01/2020 1139)  naloxone (NARCAN) injection (2 mg Intravenous Given 06/04/2020 1029)  insulin aspart (novoLOG) injection 5 Units (5 Units Intravenous Given 07/02/2020 1139)    And  dextrose 50 % solution 50 mL (50 mLs Intravenous Given 06/27/2020 1139)  sodium bicarbonate injection 50 mEq (50 mEq Intravenous Given 06/16/2020 1139)  calcium gluconate 1 g/ 50 mL sodium chloride IVPB (0 g Intravenous Stopped 06/16/2020 1335)  cefTRIAXone (ROCEPHIN) 2 g in sodium chloride 0.9 % 100 mL IVPB (0 g Intravenous Stopped 06/30/2020 1258)  etomidate (AMIDATE) injection (20 mg Intravenous Given 06/12/2020 1214)  rocuronium (ZEMURON) injection (100 mg Intravenous Given 06/13/2020 1214)  PHENYLephrine 40 mcg/ml in normal saline Adult IV Push Syringe (For Blood Pressure Support) (80 mcg Intravenous Given  06/05/2020 1214)    ED Course  I have reviewed the triage vital signs and the nursing notes.  Pertinent labs & imaging results that were available during my care of the patient were reviewed by me and considered in my medical decision making (see chart for details).  Clinical Course as of Jun 16 1507  Sat Jun 15, 2020  1142 BP has improved with IV fluids.  Pt still not verbally responding to questions.  Moans.  Some movement of head but no movement of extremities noted.   [JK]  1143 ABG shows mild acidosis.  K and NA significantly elevated   [JK]  1151 Istat shows hyperkalemia, hypernatremia, uremia, renal failure.     [JK]    Clinical Course User Index [JK] Dorie Rank, MD   MDM Rules/Calculators/A&P                          Medical Decision Making: Cylan Borum  is a 34 y.o. male who presented to the ED today with found down unresponsive.  Past medical history significant for polysubstance abuse and ADD Reviewed and confirmed nursing documentation for past medical history, family history, social history.  On my initial exam, the pt was unresponsive, GCS 6, multiple skin sores and abrasions, soft non distended abdomen.  Concern for possible OD given family's reported hx of drug use, however unclear etiology of current state.  Narcan given upon arrival without response. Hypotensive on arrival with systolic 70, given LR bolus and phenylephrine. Central line placed (as above). Once hemodynamics optimized and BP stable in 962X systolic, pt was intubated for airway protection (as above) without complication.  Labs show: metabolic acidosis with pH 7.31, pCO2 33, HCO3 17 Hypernatremia Na 155 Hyperkalemia- K 7.5, given calcium, insulin and dextrose.  Acute renal failure- Cr 17, BUN >130.  Nephrology consulted - will plan for CRRT  Pt is critically ill and with require ICU admission, Critical care consulted   CXR obtained without acute cardiopulmonary abnormality, confirmed central lines placement and ET tube placement. UA without evidence of infection. UDS positive for amphetamines.  Blood cultures obtained, given empiric broad spectrum abx.  Critical care team to place arterial line and HD catheter.   All radiology and laboratory studies reviewed independently and with my attending physician, agree with reading provided by radiologist unless otherwise noted.   Upon reassessing patient, patient was unchanged mental status, GCS 6, incomprehensible grunting, no purposeful movements.   Based on the above findings, I believe patient requires admission.  Dispo: Admitted to ICU   The above care was discussed with and agreed upon by my attending physician.   Emergency Department Medication Summary:  Medications  phenylephrine 0.4-0.9 MG/10ML-% injection  (has no administration in time range)  sodium chloride 0.9 % bolus 1,000 mL (0 mLs Intravenous Stopped 06/07/2020 1226)    And  sodium chloride 0.9 % bolus 1,000 mL (0 mLs Intravenous Stopped 06/16/2020 1226)    And  0.9 %  sodium chloride infusion (has no administration in time range)  fentaNYL (SUBLIMAZE) injection 100 mcg (100 mcg Intravenous Given 06/10/2020 1220)  fentaNYL (SUBLIMAZE) injection 100 mcg (has no administration in time range)  midazolam (VERSED) injection 2 mg (has no administration in time range)  midazolam (VERSED) injection 2 mg (has no administration in time range)  docusate sodium (COLACE) capsule 100 mg (has no administration in time range)  polyethylene glycol (MIRALAX / GLYCOLAX) packet 17 g (has no administration in time  range)  ondansetron (ZOFRAN) injection 4 mg (has no administration in time range)  pantoprazole (PROTONIX) injection 40 mg (has no administration in time range)  heparin injection 1,000-6,000 Units (has no administration in time range)  heparinized saline (2000 units/L) primer fluid for CRRT (has no administration in time range)  sodium bicarbonate 150 mEq in sterile water 1,000 mL infusion (has no administration in time range)  prismasol BGK 2/2.5 replacement solution (has no administration in time range)  prismasol BGK 2/2.5 dialysis solution (has no administration in time range)  calcium chloride 1 g in sodium chloride 0.9 % 100 mL IVPB (has no administration in time range)  phenylephrine (NEO-SYNEPHRINE) injection (80 mcg Intravenous Given 06/07/2020 1035)  0.9 %  sodium chloride infusion ( Intravenous Stopped 07/02/2020 1139)  lactated ringers infusion ( Intravenous Stopped 06/05/2020 1139)  naloxone (NARCAN) injection (2 mg Intravenous Given 06/09/2020 1029)  insulin aspart (novoLOG) injection 5 Units (5 Units Intravenous Given 06/14/2020 1139)    And  dextrose 50 % solution 50 mL (50 mLs Intravenous Given 06/05/2020 1139)  sodium bicarbonate injection 50  mEq (50 mEq Intravenous Given 06/09/2020 1139)  calcium gluconate 1 g/ 50 mL sodium chloride IVPB (0 g Intravenous Stopped 06/05/2020 1335)  cefTRIAXone (ROCEPHIN) 2 g in sodium chloride 0.9 % 100 mL IVPB (0 g Intravenous Stopped 06/29/2020 1258)  etomidate (AMIDATE) injection (20 mg Intravenous Given 06/14/2020 1214)  rocuronium (ZEMURON) injection (100 mg Intravenous Given 06/23/2020 1214)  PHENYLephrine 40 mcg/ml in normal saline Adult IV Push Syringe (For Blood Pressure Support) (80 mcg Intravenous Given 06/07/2020 1214)       Final Clinical Impression(s) / ED Diagnoses Final diagnoses:  Unresponsive state    Rx / DC Orders ED Discharge Orders    None       Roosevelt Locks, MD 06/24/2020 [id number]    Dorie Rank, MD 06/16/20 [phone]

## 2020-06-15 NOTE — Progress Notes (Signed)
Pt arrived from ED w/full report given by [facility]  All lines verified.

## 2020-06-15 NOTE — Progress Notes (Signed)
Called ABG results to Lorin Glass, MD [facility]. No new orders at this time.

## 2020-06-15 NOTE — ED Notes (Signed)
Unable to validate vitals at this time, pt was in CT.

## 2020-06-15 NOTE — Progress Notes (Signed)
ABG results given to Linwood Dibbles, MD. No new orders at this time.

## 2020-06-15 NOTE — Consult Note (Signed)
[facility] Nephrology Consultation Note  Requesting MD: Dr Linwood Dibbles Reason for consult: AKI, hyperkalemia  HPI:  Derek Jennings is a 34 y.o. male with history of ADHD who was brought in by EMS after being found down on the floor, minimal responsiveness with a GCS of 3, seen as a consultation at the request of ER physician for severe hyperkalemia and acute kidney injury. Per chart, he has history of depression and drug abuse who was seen by family about a week prior.  EMS found minimally responsive with GCS of 3, dilated pupil, increased work of breathing.  He was bagged en route to the ER. On arrival, he was hypotensive to 92/55, temperature 96.9.  Intubated.  The labs showed sodium 155, potassium 7.5, BUN more than 130, creatinine level 17, hemoglobin 16 and WBC 14.5.  Urine toxicology positive for amphetamine.  UA without protein.  Alcohol and salicylate negative.  He received IV fluid resuscitation in ER.  The hyperkalemia is treated with IVF, insulin, dextrose, sodium bicarbonate and calcium gluconate.  He is now being admitted to ICU for further management.  Creatinine, Ser  Date/Time Value Ref Range Status  06/20/2020 11:41 AM 17.00 (H) 0.61 - 1.24 mg/dL Final  37/85/8850 27:74 AM 0.93 0.61 - 1.24 mg/dL Final    PMHx:   Past Medical History:  Diagnosis Date  . ADD (attention deficit disorder)   . Allergy   . Hyperhidrosis     Past Surgical History:  Procedure Laterality Date  . ADENOIDECTOMY    . APPLICATION OF WOUND VAC Left 12/13/2018   Procedure: Application Of Wound Vac;  Surgeon: [name], MD;  Location: [facility] OR;  Service: Orthopedics;  Laterality: Left;  . I & D EXTREMITY Left 12/10/2018   Procedure: IRRIGATION AND DEBRIDEMENT EXTREMITY placement of wound vac;  Surgeon: Beverely Low, MD;  Location: [facility] ORS;  Service: Orthopedics;  Laterality: Left;  . I & D EXTREMITY Left 12/13/2018   Procedure: LEFT FOOT DEBRIDEMENT AND SPLIT THICKNESS SKIN GRAFT;   Surgeon: [name], MD;  Location: [facility] OR;  Service: Orthopedics;  Laterality: Left;    Family Hx:  Family History  Problem Relation Age of Onset  . Hypertension Mother   . Heart disease Father   . Stroke Father   . Diabetes Other     Social History:  reports that he has been smoking. He has been smoking about 0.20 packs per day. He has quit using smokeless tobacco. He reports current alcohol use. He reports that he does not use drugs.  Allergies:  Allergies  Allergen Reactions  . Amoxicillin Hives  . Penicillins Hives and Rash    Did it involve swelling of the face/tongue/throat, SOB, or low BP? Unknown Did it involve sudden or severe rash/hives, skin peeling, or any reaction on the inside of your mouth or nose? Unknown Did you need to seek medical attention at a hospital or doctor's office? Unknown When did it last happen? If all above answers are "NO", may proceed with cephalosporin use.     Medications: Prior to Admission medications   Medication Sig Start Date End Date Taking? Authorizing Provider  acetaminophen (TYLENOL) 325 MG tablet Take 1-2 tablets (325-650 mg total) by mouth every 6 (six) hours as needed for mild pain (pain score 1-3 or temp > 100.5). 12/14/18   Zannie Cove, MD  famotidine (PEPCID) 10 MG tablet Take 10 mg by mouth 3 (three) times daily.    [provider]  ibuprofen (  ADVIL) 200 MG tablet Take 800-1,000 mg by mouth every 6 (six) hours as needed for moderate pain.    [provider]  loratadine (CLARITIN) 10 MG tablet Take 10 mg by mouth daily.    [provider]  Melatonin 5 MG TABS Take 5 mg by mouth at bedtime as needed (sleep).    [provider]  oxyCODONE (OXY IR/ROXICODONE) 5 MG immediate release tablet Take 1 tablet (5 mg total) by mouth every 6 (six) hours as needed for moderate pain. 12/14/18   Zannie CoveJoseph, Preetha, MD  silver sulfADIAZINE (SILVADENE) 1 % cream Apply 1 application topically daily. Apply  to left foot daily 01/05/19   Rayburn, Fanny BienShawn Montgomery, PA-C  vitamin C (ASCORBIC ACID) 500 MG tablet Take 1,500 mg by mouth daily.    [provider]    I have reviewed the patient's current medications.  Labs:  Results for orders placed or performed during the hospital encounter of 23-Oct-2019 (from the past 48 hour(s))  CBG monitoring, ED     Status: Abnormal   Collection Time: 23-Oct-2019 10:30 AM  Result Value Ref Range   Glucose-Capillary 127 (H) 70 - 99 mg/dL    Comment: Glucose reference range applies only to samples taken after fasting for at least 8 hours.  Urine rapid drug screen (hosp performed)     Status: Abnormal   Collection Time: 23-Oct-2019 10:44 AM  Result Value Ref Range   Opiates NONE DETECTED NONE DETECTED   Cocaine NONE DETECTED NONE DETECTED   Benzodiazepines NONE DETECTED NONE DETECTED   Amphetamines POSITIVE (A) NONE DETECTED   Tetrahydrocannabinol NONE DETECTED NONE DETECTED   Barbiturates NONE DETECTED NONE DETECTED    Comment: (NOTE) DRUG SCREEN FOR MEDICAL PURPOSES ONLY.  IF CONFIRMATION IS NEEDED FOR ANY PURPOSE, NOTIFY LAB WITHIN 5 DAYS.  LOWEST DETECTABLE LIMITS FOR URINE DRUG SCREEN Drug Class                     Cutoff (ng/mL) Amphetamine and metabolites    1000 Barbiturate and metabolites    200 Benzodiazepine                 200 Tricyclics and metabolites     300 Opiates and metabolites        300 Cocaine and metabolites        300 THC                            50 Performed at [facility], [address] [place], [place] [address]   Urinalysis, Routine w reflex microscopic     Status: Abnormal   Collection Time: 23-Oct-2019 10:44 AM  Result Value Ref Range   Color, Urine AMBER (A) YELLOW    Comment: BIOCHEMICALS MAY BE AFFECTED BY COLOR   APPearance CLEAR CLEAR   Specific Gravity, Urine 1.020 1.005 - 1.030   pH 5.0 5.0 - 8.0   Glucose, UA NEGATIVE NEGATIVE mg/dL   Hgb urine dipstick NEGATIVE NEGATIVE   Bilirubin Urine  NEGATIVE NEGATIVE   Ketones, ur NEGATIVE NEGATIVE mg/dL   Protein, ur NEGATIVE NEGATIVE mg/dL   Nitrite NEGATIVE NEGATIVE   Leukocytes,Ua NEGATIVE NEGATIVE    Comment: Performed at [facility], [address] [place], [place] [address]  I-Stat Arterial Blood Gas, ED     Status: Abnormal   Collection Time: 23-Oct-2019 10:53 AM  Result Value Ref Range   pH, Arterial  7.318 (L) 7.35 - 7.45   pCO2 arterial 33.2 32 - 48 mmHg   pO2, Arterial 363 (H) 83 - 108 mmHg   Bicarbonate 17.0 (L) 20.0 - 28.0 mmol/L   TCO2 18 (L) 22 - 32 mmol/L   O2 Saturation 100.0 %   Acid-base deficit 8.0 (H) 0.0 - 2.0 mmol/L   Sodium 155 (H) 135 - 145 mmol/L   Potassium 6.9 (HH) 3.5 - 5.1 mmol/L   Calcium, Ion 0.94 (L) 1.15 - 1.40 mmol/L   HCT 42.0 39 - 52 %   Hemoglobin 14.3 13.0 - 17.0 g/dL   Collection site Radial    Drawn by RT    Sample type ARTERIAL    Comment NOTIFIED PHYSICIAN   Salicylate level     Status: Abnormal   Collection Time: 06/06/2020 11:20 AM  Result Value Ref Range   Salicylate Lvl <7.0 (L) 7.0 - 30.0 mg/dL    Comment: Performed at [facility], [address], [place], [place] [address]  Acetaminophen level     Status: Abnormal   Collection Time: 06/21/2020 11:20 AM  Result Value Ref Range   Acetaminophen (Tylenol), Serum <10 (L) 10 - 30 ug/mL    Comment: (NOTE) Therapeutic concentrations vary significantly. A range of 10-30 ug/mL  may be an effective concentration for many patients. However, some  are best treated at concentrations outside of this range. Acetaminophen concentrations >150 ug/mL at 4 hours after ingestion  and >50 ug/mL at 12 hours after ingestion are often associated with  toxic reactions.  Performed at [facility], [address], [place], [place] [address]   Ethanol     Status: None   Collection Time: 06/25/2020 11:20 AM  Result Value Ref Range   Alcohol, Ethyl (B) <10 <10 mg/dL    Comment: (NOTE) Lowest detectable limit for serum alcohol is 10  mg/dL.  For medical purposes only. Performed at [facility], [address] [place], [place] [address]   CBC WITH DIFFERENTIAL     Status: Abnormal   Collection Time: 06/05/2020 11:20 AM  Result Value Ref Range   WBC 14.5 (H) 4.0 - 10.5 K/uL   RBC 5.13 4.22 - 5.81 MIL/uL   Hemoglobin 16.1 13.0 - 17.0 g/dL   HCT 65.5 39 - 52 %   MCV 98.8 80.0 - 100.0 fL   MCH 31.4 26.0 - 34.0 pg   MCHC 31.8 30.0 - 36.0 g/dL   RDW 37.4 82.7 - 07.8 %   Platelets 66 (L) 150 - 400 K/uL    Comment: PLATELET COUNT CONFIRMED BY SMEAR SPECIMEN CHECKED FOR CLOTS Immature Platelet Fraction may be clinically indicated, consider ordering this additional test [id number]    nRBC 0.6 (H) 0.0 - 0.2 %   Neutrophils Relative % 86 %   Neutro Abs 12.5 (H) 1.7 - 7.7 K/uL   Lymphocytes Relative 4 %   Lymphs Abs 0.6 (L) 0.7 - 4.0 K/uL   Monocytes Relative 9 %   Monocytes Absolute 1.2 (H) 0.1 - 1.0 K/uL   Eosinophils Relative 0 %   Eosinophils Absolute 0.0 0.0 - 0.5 K/uL   Basophils Relative 0 %   Basophils Absolute 0.0 0.0 - 0.1 K/uL   Immature Granulocytes 1 %   Abs Immature Granulocytes 0.15 (H) 0.00 - 0.07 K/uL    Comment: Performed at [facility], [address] [place], [place] [address]  Protime-INR     Status: Abnormal   Collection Time:  07-02-20 11:20 AM  Result Value Ref Range   Prothrombin Time 23.4 (H) 11.4 - 15.2 seconds   INR 2.2 (H) 0.8 - 1.2    Comment: (NOTE) INR goal varies based on device and disease states. Performed at [facility], [address] [place], [place] [address]   I-Stat Chem 8, ED     Status: Abnormal   Collection Time: 07-02-2020 11:41 AM  Result Value Ref Range   Sodium 155 (H) 135 - 145 mmol/L   Potassium 7.5 (HH) 3.5 - 5.1 mmol/L   Chloride 120 (H) 98 - 111 mmol/L   BUN >130 (H) 6 - 20 mg/dL   Creatinine, Ser 49.70 (H) 0.61 - 1.24 mg/dL   Glucose, Bld 263 (H) 70 - 99 mg/dL    Comment: Glucose reference range applies only to samples taken after  fasting for at least 8 hours.   Calcium, Ion 0.85 (LL) 1.15 - 1.40 mmol/L   TCO2 16 (L) 22 - 32 mmol/L   Hemoglobin 16.0 13.0 - 17.0 g/dL   HCT 78.5 39 - 52 %   Comment NOTIFIED PHYSICIAN      ROS: Patient is unresponsive therefore ROS is limited.  Physical Exam: Vitals:   2020-07-02 1224 02-Jul-2020 1227  BP: 98/67 110/66  Pulse: (!) 107 (!) 102  Resp: 14 14  Temp: (!) 96.4 F (35.8 C) (!) 96.4 F (35.8 C)  SpO2: 100% 100%     General exam: Unresponsive male with dry mucous membrane, intubated Respiratory system: Coarse breath sound bilateral Cardiovascular system: S1 & S2 heard, RRR.  No pedal edema. Gastrointestinal system: Abdomen is nondistended, soft  Central nervous system: Not responding Extremities: Necrotic skin changes with pressure in lower extremities, no edema. Skin: Multiple necrotic changes in extremities  Psychiatry: Unable to assess.  Assessment/Plan:  #Acute kidney injury likely ATN in the setting of severe dehydration/hypotension and possibly rhabdomyolysis after found down on the floor.  UA without proteinuria.  CK level pending.  Order kidney ultrasound.  Foley catheter was inserted for a strict ins and out. Received IV fluid bolus in ER, needs more IV fluid resuscitation. Given severe hyperkalemia and uremia, he will need urgent dialysis.  Given low blood pressure we will do CRRT in ICU.  I tried to call patient's mother multiple times without success.  Start CRRT as this is a medical emergency. Use a low potassium bath, heparin once CT scan ruled out any bleeding.  Try to maintain positive with no net UF. Strict ins and out, maintain MAP more than 65.  #Severe hyperkalemia with advanced renal failure: Received medical treatment in ER.  Starting CRRT with low potassium bath.  Monitor lab.  #Unresponsive/acute metabolic encephalopathy: Getting CT scan and treatment as above.  #Acute respiratory failure with hypoxia: Now intubated.  Manage by PCCM.  Most  of the lab results are still pending including CMP.  Will review when available.  Thank you for the consult, will follow with you.   Lashawna Poche Jaynie Collins 2020/07/02, 12:41 PM  [facility].

## 2020-06-15 NOTE — Procedures (Signed)
Arterial Catheter Insertion Procedure Note  Derek Jennings  [id number]  [birth year]  Date:2020/07/01  Time:2:02 PM    Provider Performing: Delfin Gant    Procedure: Insertion of Arterial Line (11173) without US guidance  Indication(s) Blood pressure monitoring and/or need for frequent ABGs  Consent Unable to obtain consent due to emergent nature of procedure.  Anesthesia None   Time Out Verified patient identification, verified procedure, site/side was marked, verified correct patient position, special equipment/implants available, medications/allergies/relevant history reviewed, required imaging and test results available.   Sterile Technique Maximal sterile technique including full sterile barrier drape, hand hygiene, sterile gown, sterile gloves, mask, hair covering, sterile ultrasound probe cover (if used).   Procedure Description Area of catheter insertion was cleaned with chlorhexidine and draped in sterile fashion. Without real-time ultrasound guidance an arterial catheter was placed into the right radial artery.  Appropriate arterial tracings confirmed on monitor.     Complications/Tolerance none   EBL Minimal   Specimen(s) None  Delfin Gant, NP-C [facility] Contact / Pager information can be found on Amion  July 01, 2020, 2:02 PM

## 2020-06-15 NOTE — Consult Note (Signed)
NEURO HOSPITALIST CONSULT NOTE   Requesting physician: Dr. Katrinka BlazingSmith  Reason for Consult: MRI finding  History obtained from: Chart  HPI:                 Derek Jennings is an 34 y.o. male with a past medical history significant for drug abuse and depression who arrived at [facility] after being found unresponsive in bed. He was last seen by family one week ago. EMS reported that he was cool to the touch with increased work of breathing, hypotensive. Derek Jennings was bagged en route to the hospital.   Amphetamines were present on Derek Jennings's drug screen.  Pertinent Medications Fentanyl 100mcg PRN Midazolam 2mg  PRN  Pertinent Imaging/Diagnostics CT head 13Nov2021: Abnormal area of mixed attenuation, predominantly hypoattenuated with speckles of hyperattenuation within the right cerebellum measures 2.8 cm.  MR brain pending  Past Medical History:  Diagnosis Date  . ADD (attention deficit disorder)   . Allergy   . Hyperhidrosis     Past Surgical History:  Procedure Laterality Date  . ADENOIDECTOMY    . APPLICATION OF WOUND VAC Left 12/13/2018   Procedure: Application Of Wound Vac;  Surgeon: Nadara Mustarduda, Marcus V, MD;  [facility];  Service: Orthopedics;  Laterality: Left;  . I & D EXTREMITY Left 12/10/2018   Procedure: IRRIGATION AND DEBRIDEMENT EXTREMITY placement of wound vac;  Surgeon: Beverely LowNorris, Steve, MD;  Location: [facility] ORS;  Service: Orthopedics;  Laterality: Left;  . I & D EXTREMITY Left 12/13/2018   Procedure: LEFT FOOT DEBRIDEMENT AND SPLIT THICKNESS SKIN GRAFT;  Surgeon: Nadara Mustarduda, Marcus V, MD;  [facility];  Service: Orthopedics;  Laterality: Left;    Family History  Problem Relation Age of Onset  . Hypertension Mother   . Heart disease Father   . Stroke Father   . Diabetes Other     Social History:  reports that he has been smoking. He has been smoking about 0.20 packs per day. He has quit using smokeless tobacco. He reports current alcohol use. He  reports that he does not use drugs.  Allergies  Allergen Reactions  . Amoxicillin Hives  . Penicillins Hives and Rash    Did it involve swelling of the face/tongue/throat, SOB, or low BP? Unknown Did it involve sudden or severe rash/hives, skin peeling, or any reaction on the inside of your mouth or nose? Unknown Did you need to seek medical attention at a hospital or doctor's office? Unknown When did it last happen? If all above answers are "NO", may proceed with cephalosporin use.     MEDICATIONS:                                                                                                                   No outpatient medications have been marked as taking for the 09-16-2019 encounter [facility](Hospital Encounter).     Review Of Systems:  History obtained from unobtainable from patient due to mental status  Blood pressure 110/76, pulse 92, temperature (!) 96.8 F (36 C), resp. rate 15, height 6\' 2"  (1.88 m), weight 90.7 kg, SpO2 99 %.   Physical Examination: General: WDWN male. Obtunded, intubated, in bed HEENT:  Normocephalic, no lesions, without obvious abnormality.  Normal external eye and external ears, nose Cardiovascular: RRR Pulmonary: Ventilated, occasionally breathing over the vent Abdomen: Soft Musculoskeletal: Tone and bulk normal throughout; no atrophy noted Skin: Several pressure sores on the lower limbs. Nurse reports pressure sores across the back  Neurological Examination:                                                                        Mental Status: Derek Jennings is obtunded, unresponsive to verbal stimuli, does not follow commands. Cranial Nerves: II: Pupils are equal, round, subtly reactive to light. III,IV, VI: Questionable oculocephalic reflex V,VII: Upper face is symmetric VIII: Unresponsive to verbal stimuli IX,X: Gag reflex present XI:  Unable to assess XII: Unable to assess Motor/sensory: Extensor posturing to noxious stimuli in all four extremities and trunk Deep Tendon Reflexes: Dropped throughout Plantars: Dropped Cerebellar: Unable to assess Proprioception: Unable to assess Gait: Unable to assess   Lab Results: Basic Metabolic Panel: Recent Labs  Lab 06/16/2020 1120 06/07/2020 1141 06/06/2020 1427 06/22/2020 1740 06/08/2020 1801  NA 159* 155* 161* 158* 157*  K >7.5* 7.5* 2.8* 6.4* 6.4*  CL 114* 120*  --  114*  --   CO2 16*  --   --  17*  --   GLUCOSE 136* 122*  --  139*  --   BUN 315* >130*  --  295*  --   CREATININE 17.03* 17.00*  --  14.50*  --   CALCIUM 7.8*  --   --  7.9*  --   MG  --   --   --  4.1*  --   PHOS  --   --   --  >30.0*  --     Liver Function Tests: Recent Labs  Lab 06/22/2020 1120  AST 41  ALT 184*  ALKPHOS 55  BILITOT 1.9*  PROT 6.4*  ALBUMIN 3.1*   No results for input(s): LIPASE, AMYLASE in the last 168 hours. Recent Labs  Lab 06/14/2020 1831  AMMONIA 41*    CBC: Recent Labs  Lab 06/10/2020 1120 06/22/2020 1141 06/21/2020 1427 06/03/2020 1740 06/14/2020 1801  WBC 14.5*  --   --  17.6*  --   NEUTROABS 12.5*  --   --   --   --   HGB 16.1 16.0 6.5* 16.0 15.6  HCT 50.7 47.0 19.0* 49.2 46.0  MCV 98.8  --   --  95.2  --   PLT 66*  --   --  58*  58*  --     Cardiac Enzymes: Recent Labs  Lab 06/27/2020 1120  CKTOTAL 2,353*    Lipid Panel: No results for input(s): CHOL, TRIG, HDL, CHOLHDL, VLDL, LDLCALC in the last 168 hours.  CBG: Recent Labs  Lab 06/18/2020 1030  GLUCAP 127*    Microbiology: Results for orders placed or performed during the hospital encounter of 06/06/2020  Respiratory Panel by RT PCR (Flu A&B,  Covid) - Nasopharyngeal Swab     Status: None   Collection Time: 2020/07/14 10:41 AM   Specimen: Nasopharyngeal Swab  Result Value Ref Range Status   SARS Coronavirus 2 by RT PCR NEGATIVE NEGATIVE Final    Comment: (NOTE) SARS-CoV-2 target nucleic acids are NOT  DETECTED.  The SARS-CoV-2 RNA is generally detectable in upper respiratoy specimens during the acute phase of infection. The lowest concentration of SARS-CoV-2 viral copies this assay can detect is 131 copies/mL. A negative result does not preclude SARS-Cov-2 infection and should not be used as the sole basis for treatment or other patient management decisions. A negative result may occur with  improper specimen collection/handling, submission of specimen other than nasopharyngeal swab, presence of viral mutation(s) within the areas targeted by this assay, and inadequate number of viral copies (<131 copies/mL). A negative result must be combined with clinical observations, patient history, and epidemiological information. The expected result is Negative.  Fact Sheet for Patients:  https://www.moore.com/  Fact Sheet for Healthcare Providers:  https://www.young.biz/  This test is no t yet approved or cleared by the [place] FDA and  has been authorized for detection and/or diagnosis of SARS-CoV-2 by FDA under an Emergency Use Authorization (EUA). This EUA will remain  in effect (meaning this test can be used) for the duration of the COVID-19 declaration under Section 564(b)(1) of the Act, 21 U.S.C. section 360bbb-3(b)(1), unless the authorization is terminated or revoked sooner.     Influenza A by PCR NEGATIVE NEGATIVE Final   Influenza B by PCR NEGATIVE NEGATIVE Final    Comment: (NOTE) The Xpert Xpress SARS-CoV-2/FLU/RSV assay is intended as an aid in  the diagnosis of influenza from Nasopharyngeal swab specimens and  should not be used as a sole basis for treatment. Nasal washings and  aspirates are unacceptable for Xpert Xpress SARS-CoV-2/FLU/RSV  testing.  Fact Sheet for Patients: https://www.moore.com/  Fact Sheet for Healthcare Providers: https://www.young.biz/  This test is not yet  approved or cleared by the [place] FDA and  has been authorized for detection and/or diagnosis of SARS-CoV-2 by  FDA under an Emergency Use Authorization (EUA). This EUA will remain  in effect (meaning this test can be used) for the duration of the  Covid-19 declaration under Section 564(b)(1) of the Act, 21  U.S.C. section 360bbb-3(b)(1), unless the authorization is  terminated or revoked. Performed at [facility], [address] [place], [place] [address]   MRSA PCR Screening     Status: None   Collection Time: 14-Jul-2020  5:57 PM   Specimen: Nasal Mucosa; Nasopharyngeal  Result Value Ref Range Status   MRSA by PCR NEGATIVE NEGATIVE Final    Comment:        The GeneXpert MRSA Assay (FDA approved for NASAL specimens only), is one component of a comprehensive MRSA colonization surveillance program. It is not intended to diagnose MRSA infection nor to guide or monitor treatment for MRSA infections. Performed at [facility], [address] [place], [place] [address]     Coagulation Studies: Recent Labs    07-14-20 1120 07-14-2020 1740  LABPROT 23.4* 22.2*  22.3  INR 2.2* 2.0*  2.0    Imaging: CT Head Wo Contrast  Result Date: 07-14-2020 CLINICAL DATA:  Head trauma.  Mental status change. EXAM: CT HEAD WITHOUT CONTRAST TECHNIQUE: Contiguous axial images were obtained from the base of the skull through the vertex without intravenous contrast. COMPARISON:  None. FINDINGS: Brain: Abnormal area of mixed  attenuation, predominantly hypoattenuated with speckles of hyperattenuation within the right cerebellum measures 2.8 cm. There is no evidence of hydrocephalus or mass effect. The ventricular system is normal. Vascular: No hyperdense vessel or unexpected calcification. Skull: Normal. Negative for fracture or focal lesion. Sinuses/Orbits: Complete opacification of the right maxillary sinus. Partial opacification of the right ethmoid sinus. Normal appearance of the  orbits. Other: Right parietal scalp hematoma. IMPRESSION: 1. Abnormal area of mixed attenuation, predominantly hypoattenuated with speckles of hyperattenuation within the right cerebellum measures 2.8 cm. This may represent an acute intraparenchymal hemorrhage, a hemorrhagic infarct, or potentially a hemorrhagic mass. Further evaluation with MRI of the brain may be considered. 2. Right parietal scalp hematoma. 3. Right maxillary and ethmoid sinusitis. Critical Value/emergent results were called by telephone at the time of interpretation on 11-Jul-2020 at 3:03 pm to provider Dr. Andris Baumann, who verbally acknowledged these results. Electronically Signed   By: Ted Mcalpine M.D.   On: 07-11-20 15:04   CT Cervical Spine Wo Contrast  Result Date: 07/11/20 CLINICAL DATA:  Found unresponsive. EXAM: CT CERVICAL SPINE WITHOUT CONTRAST TECHNIQUE: Multidetector CT imaging of the cervical spine was performed without intravenous contrast. Multiplanar CT image reconstructions were also generated. COMPARISON:  None. FINDINGS: Alignment: Normal. Skull base and vertebrae: No acute fracture. No primary bone lesion or focal pathologic process. Soft tissues and spinal canal: No prevertebral fluid or swelling. No visible canal hematoma. Disc levels:  Normal. Upper chest: Negative. Other: Endotracheal tube and enteric catheter noted. IMPRESSION: No evidence of acute traumatic injury to the cervical spine. Electronically Signed   By: Ted Mcalpine M.D.   On: July 11, 2020 15:13   US RENAL  Result Date: 11-Jul-2020 CLINICAL DATA:  Acute renal insufficiency EXAM: RENAL / URINARY TRACT ULTRASOUND COMPLETE COMPARISON:  None. FINDINGS: Right Kidney: Renal measurements: 10.6 x 4.9 x 5.8 cm = volume: 155.1 mL. Mild increased renal cortical echotexture. No mass or hydronephrosis. Left Kidney: Renal measurements: 10.1 x 4.9 x 5.0 cm = volume: 129.0 mL. Mild increased renal cortical echotexture. No mass or hydronephrosis.  Bladder: Decompressed, Foley catheter in place. Other: None. IMPRESSION: 1. Mild bilateral increased renal cortical echotexture consistent with medical renal disease. Electronically Signed   By: Sharlet Salina M.D.   On: 07/11/2020 15:34   DG Chest Portable 1 View  Result Date: 07/11/2020 CLINICAL DATA:  Endotracheal tube placement, respiratory failure EXAM: PORTABLE CHEST 1 VIEW COMPARISON:  On July 11, 2020 at 10:44 a.m. FINDINGS: Left IJ line tip: Brachiocephalic vein confluence. Endotracheal tube tip is 4.5 cm above the carina and satisfactorily positioned. A nasogastric tube enters the stomach. The patient is rotated to the right on today's radiograph, reducing diagnostic sensitivity and specificity. No pneumothorax. The lungs appear clear. No blunting of the costophrenic angles. IMPRESSION: 1. Left IJ line tip: Brachiocephalic vein confluence. 2. Endotracheal tube tip 4.5 cm above the carina and satisfactorily positioned. Electronically Signed   By: Gaylyn Rong M.D.   On: 07-11-20 13:15   DG Chest Port 1 View  Result Date: 07-11-2020 CLINICAL DATA:  Possible overdose.  Unresponsive. EXAM: PORTABLE CHEST 1 VIEW COMPARISON:  02/09/2014 FINDINGS: Patient is rotated to the right. Low volume film with mild vascular congestion. Subtle patchy airspace disease noted left base. Cardiopericardial silhouette is at upper limits of normal for size. Left IJ central line tip overlies the innominate vein confluence. No evidence for pneumothorax or pleural effusion. Gaseous distention of bowel in the left abdomen likely stomach and colon. IMPRESSION: Subtle patchy airspace disease at  the left base. Otherwise no acute cardiopulmonary findings. Electronically Signed   By: Kennith Center M.D.   On: 2020/07/05 11:57   ECHOCARDIOGRAM COMPLETE  Result Date: 2020/07/05    ECHOCARDIOGRAM REPORT   Patient Name:   Derek Jennings Date of Exam: 05-Jul-2020 Medical Rec #:  [id number]    Height:       74.0 in Accession #:     [id number]   Weight:       200.0 lb Date of Birth:  1986/05/27   BSA:          2.173 m Patient Age:    34 years     BP:           126/76 mmHg Patient Gender: M            HR:           91 bpm. Exam Location:  Inpatient Procedure: 2D Echo, Cardiac Doppler and Color Doppler Indications:    Dyspnea  History:        Patient has no prior history of Echocardiogram examinations.                 Risk Factors:Current Smoker. Polysubstance abuse.  Sonographer:    Ross Ludwig RDCS (AE) Referring Phys: [phone] [facility] F DAVIS  Sonographer Comments: Technically difficult study due to poor echo windows, suboptimal parasternal window, suboptimal apical window and echo performed with patient supine and on artificial respirator. Image acquisition challenging due to respiratory motion. IMPRESSIONS  1. Left ventricular ejection fraction, by estimation, is 60 to 65%. The left ventricle has normal function. The left ventricle has no regional wall motion abnormalities. There is mild left ventricular hypertrophy. Left ventricular diastolic parameters are consistent with Grade I diastolic dysfunction (impaired relaxation).  2. Right ventricular systolic function is normal. The right ventricular size is normal.  3. The mitral valve is normal in structure. No evidence of mitral valve regurgitation. No evidence of mitral stenosis.  4. The aortic valve has an indeterminant number of cusps. Aortic valve regurgitation is not visualized. No aortic stenosis is present.  5. Aortic dilatation noted. There is moderate dilatation of the ascending aorta, measuring 45 mm.  6. The inferior vena cava is normal in size with greater than 50% respiratory variability, suggesting right atrial pressure of 3 mmHg. FINDINGS  Left Ventricle: Left ventricular ejection fraction, by estimation, is 60 to 65%. The left ventricle has normal function. The left ventricle has no regional wall motion abnormalities. The left ventricular internal cavity size was normal in size.  There is  mild left ventricular hypertrophy. Left ventricular diastolic parameters are consistent with Grade I diastolic dysfunction (impaired relaxation). Right Ventricle: The right ventricular size is normal. Right ventricular systolic function is normal. Left Atrium: Left atrial size was normal in size. Right Atrium: Right atrial size was normal in size. Pericardium: There is no evidence of pericardial effusion. Mitral Valve: The mitral valve is normal in structure. No evidence of mitral valve regurgitation. No evidence of mitral valve stenosis. Tricuspid Valve: The tricuspid valve is normal in structure. Tricuspid valve regurgitation is not demonstrated. No evidence of tricuspid stenosis. Aortic Valve: The aortic valve has an indeterminant number of cusps. Aortic valve regurgitation is not visualized. No aortic stenosis is present. Aortic valve mean gradient measures 3.0 mmHg. Aortic valve peak gradient measures 4.3 mmHg. Aortic valve area, by VTI measures 2.16 cm. Pulmonic Valve: The pulmonic valve was not well visualized. Pulmonic valve regurgitation is not visualized. No evidence  of pulmonic stenosis. Aorta: Aortic dilatation noted. There is moderate dilatation of the ascending aorta, measuring 45 mm. Venous: The inferior vena cava is normal in size with greater than 50% respiratory variability, suggesting right atrial pressure of 3 mmHg.   LEFT VENTRICLE PLAX 2D LVIDd:         3.00 cm  Diastology LVIDs:         1.80 cm  LV e' medial:    6.85 cm/s LV PW:         1.10 cm  LV E/e' medial:  8.4 LV IVS:        1.30 cm  LV e' lateral:   5.87 cm/s LVOT diam:     1.80 cm  LV E/e' lateral: 9.8 LV SV:         37 LV SV Index:   17 LVOT Area:     2.54 cm  RIGHT VENTRICLE             IVC RV S prime:     16.10 cm/s  IVC diam: 1.80 cm TAPSE (M-mode): 1.8 cm LEFT ATRIUM           Index      RIGHT ATRIUM          Index LA diam:      1.20 cm 0.55 cm/m RA Area:     9.17 cm LA Vol (A4C): 11.0 ml 5.06 ml/m RA Volume:    15.50 ml 7.13 ml/m  AORTIC VALVE AV Area (Vmax):    2.20 cm AV Area (Vmean):   2.05 cm AV Area (VTI):     2.16 cm AV Vmax:           104.00 cm/s AV Vmean:          86.800 cm/s AV VTI:            0.173 m AV Peak Grad:      4.3 mmHg AV Mean Grad:      3.0 mmHg LVOT Vmax:         90.10 cm/s LVOT Vmean:        69.900 cm/s LVOT VTI:          0.147 m LVOT/AV VTI ratio: 0.85  AORTA Ao Root diam: 3.00 cm MITRAL VALVE MV Area (PHT): 3.72 cm    SHUNTS MV Decel Time: 204 msec    Systemic VTI:  0.15 m MV E velocity: 57.40 cm/s  Systemic Diam: 1.80 cm MV A velocity: 62.60 cm/s MV E/A ratio:  0.92 Olga Millers MD Electronically signed by Olga Millers MD Signature Date/Time: 06/07/2020/4:56:42 PM    Final      Assessment and Plan:  Derek Jennings is a 34 year old gentleman with a past medical history significant for depression and drug abuse. He presented to [facility] after being found unresponsive in bed, last seen by family one week prior. It is unclear how long he was unresponsive, but considering the large pressure sores across his back and lower limbs and soiled presentation it is likely that he was stationary for several days.  Per MRI, Derek Jennings has an abnormal area of mixed attenuation, predominantly hypoattenuated with speckles of hyperattenuation within the right cerebellum. Considering his amphetamine use, this is lesion concerning for a hemorrhagic stroke. MRI is pending. Patient will be followed to ensure proper management of swelling or herniation.  Derek Jennings examination may be due to the lesion in his cerebellum or severe metabolic derangement. He is currently being  treated by [facility] for AKI. Although his MRI may be revealing, his metabolic derangement needs to normalized before a meaningful assessment can be done. Neurology will continue to follow.  Derek Potter PA-C [facility]  07-05-2020, 7:14 PM

## 2020-06-15 NOTE — H&P (Signed)
NAME:  Derek Jennings, MRN:  [id number], DOB:  Jan 16, 1986, LOS: 0 ADMISSION DATE:  23-Jun-2020, CONSULTATION DATE: 11/13 REFERRING MD:  Dr. Lynelle Doctor, CHIEF COMPLAINT: Acute renal failure  Brief History   34 year old male presents to ED via EMS after being found minimally responsive by family.  Last seen normal 1 week prior to admission.  Known history of depression and drug abuse.  PCCM consulted for further management and admission  History of present illness   History and physical obtained per chart review and verbal report given patient is currently encephalopathic and intubated  [name] is a 34 year old male with past medical history significant depression and drug abuse who presented to the emergency department with altered mental status after being found minimally responsive by family. Per chart review patient was last seen normal 1 week prior to admission.  There is reportedly history of depression and drug abuse.  On arrival patient was being bagged due to severely depressed mentation with a GCS of 3.  EMS reports possible drug paraphernalia on site.  Urine toxicology positive for amphetamines.  Full laboratory evaluation pending at time of assessment, relevant reported significant lab work thus far included sodium 155, potassium 7.5, glucose 122, BUN greater than 130, creatinine 17, ionized calcium 0.85, white blood cell count 14.5, and INR 22.  Chest x-ray relatively unremarkable.  Given severe renal failure with a creatinine of 17 and hyperkalemia nephrology was consulted.  Patient will need emergent hemodialysis.  Also concern for severe rhabdomyolysis, CK-MB currently pending.  Additionally patient was electively intubated in the emergency department due to depressed GCS.  PCCM consulted for further management and admission.  Past Medical History  Depression Drug abuse  Significant Hospital Events   Admitted 11/13 required intubation for airway protection  Consults:   Nephrology  Procedures:    Significant Diagnostic Tests:  Chest x-ray 11/13 > Subtle patchy airspace disease at the left base.  Micro Data:  COVID 11/13 > Blood cultures 11/13 >  Antimicrobials:     Interim history/subjective:  Admitting.  Objective   Blood pressure 93/62, pulse 89, temperature (!) 96.6 F (35.9 C), resp. rate 15, height 6\' 2"  (1.88 m), weight 90.7 kg, SpO2 100 %.        Intake/Output Summary (Last 24 hours) at 2020/06/23 1211 Last data filed at 06-23-20 1029 Gross per 24 hour  Intake 1000 ml  Output --  Net 1000 ml   Filed Weights   06/23/2020 1040  Weight: 90.7 kg    Examination: Constitutional: unresponsive young man on vent  Eyes: eyes pinpoint but reactive/requal Ears, nose, mouth, and throat: MM dry, trachea midline Cardiovascular: RRR, ext warm Respiratory: scattered rhonci, no wheezing Gastrointestinal: soft, hypoactive BS Skin: numerous pressue ulcers in various stages on ext, torso Neurologic: comatose, some of this may be from induction for intubation Psychiatric: cannot assess  K 7.5 Cr 17 BUN 315 Ck 2300 Plts low Hgb okay WBC mildly elevated Mild transaminitis and hyperbilirubinemia  Resolved Hospital Problem list     Assessment & Plan:  Acute uremic encephalopathy Acute renal failure with hyperkalemia - Appreciate nephrology recs, to start CRRT - Check head CT - Limit sedation to PRN for vent synchrony and comfort  Acute hypoxemic respiratory failure due to inability to protect airway - Mechanical ventilation, lung protective tidal volumes, VAP prevention bundle  Thrombocytopenia, INR elevation - Check DIC panel, trend  Hx polysubstance abuse- like etiology for most of issues above  Best practice:  Diet: N.p.o., consider initiation  of tube feeds soon Pain/Anxiety/Delirium protocol (if indicated): Pad protocol VAP protocol (if indicated): In place DVT prophylaxis: SCDs will initiate pharmacological DVT  prophylaxis once head CT resulted GI prophylaxis: PPI Glucose control: SSI Mobility: Bedrest Code Status: Full code Family Communication: We will update once identified Disposition: ICU  Labs   CBC: Recent Labs  Lab 06/08/2020 1053 06/25/2020 1141  HGB 14.3 16.0  HCT 42.0 47.0    Basic Metabolic Panel: Recent Labs  Lab 07/02/2020 1053 06/13/2020 1141  NA 155* 155*  K 6.9* 7.5*  CL  --  120*  GLUCOSE  --  122*  BUN  --  >130*  CREATININE  --  17.00*   GFR: Estimated Creatinine Clearance: 7.3 mL/min (A) (by C-G formula based on SCr of 17 mg/dL (H)). No results for input(s): PROCALCITON, WBC, LATICACIDVEN in the last 168 hours.  Liver Function Tests: No results for input(s): AST, ALT, ALKPHOS, BILITOT, PROT, ALBUMIN in the last 168 hours. No results for input(s): LIPASE, AMYLASE in the last 168 hours. No results for input(s): AMMONIA in the last 168 hours.  ABG    Component Value Date/Time   PHART 7.318 (L) 06/04/2020 1053   PCO2ART 33.2 06/23/2020 1053   PO2ART 363 (H) 06/08/2020 1053   HCO3 17.0 (L) 06/23/2020 1053   TCO2 16 (L) 06/27/2020 1141   ACIDBASEDEF 8.0 (H) 06/24/2020 1053   O2SAT 100.0 06/06/2020 1053     Coagulation Profile: Recent Labs  Lab 06/25/2020 1120  INR 2.2*    Cardiac Enzymes: No results for input(s): CKTOTAL, CKMB, CKMBINDEX, TROPONINI in the last 168 hours.  HbA1C: Hgb A1c MFr Bld  Date/Time Value Ref Range Status  12/11/2018 04:22 AM 5.5 4.8 - 5.6 % Final    Comment:    (NOTE) Pre diabetes:          5.7%-6.4% Diabetes:              >6.4% Glycemic control for   <7.0% adults with diabetes     CBG: Recent Labs  Lab 06/07/2020 1030  GLUCAP 127*    Review of Systems:   Unable to gather given acute encephalopathy intubation  Past Medical History  He,  has a past medical history of ADD (attention deficit disorder), Allergy, and Hyperhidrosis.   Surgical History    Past Surgical History:  Procedure Laterality Date  .  ADENOIDECTOMY    . APPLICATION OF WOUND VAC Left 12/13/2018   Procedure: Application Of Wound Vac;  Surgeon: [name], MD;  Location: [facility] OR;  Service: Orthopedics;  Laterality: Left;  . I & D EXTREMITY Left 12/10/2018   Procedure: IRRIGATION AND DEBRIDEMENT EXTREMITY placement of wound vac;  Surgeon: Beverely Low, MD;  Location: [facility] ORS;  Service: Orthopedics;  Laterality: Left;  . I & D EXTREMITY Left 12/13/2018   Procedure: LEFT FOOT DEBRIDEMENT AND SPLIT THICKNESS SKIN GRAFT;  Surgeon: [name], MD;  Location: [facility] OR;  Service: Orthopedics;  Laterality: Left;     Social History   reports that he has been smoking. He has been smoking about 0.20 packs per day. He has quit using smokeless tobacco. He reports current alcohol use. He reports that he does not use drugs.   Family History   His family history includes Diabetes in an other family member; Heart disease in his father; Hypertension in his mother; Stroke in his father.   Allergies Allergies  Allergen Reactions  . Amoxicillin Hives  . Penicillins Hives and Rash  Did it involve swelling of the face/tongue/throat, SOB, or low BP? Unknown Did it involve sudden or severe rash/hives, skin peeling, or any reaction on the inside of your mouth or nose? Unknown Did you need to seek medical attention at a hospital or doctor's office? Unknown When did it last happen? If all above answers are "NO", may proceed with cephalosporin use.      Home Medications  Prior to Admission medications   Medication Sig Start Date End Date Taking? Authorizing Provider  acetaminophen (TYLENOL) 325 MG tablet Take 1-2 tablets (325-650 mg total) by mouth every 6 (six) hours as needed for mild pain (pain score 1-3 or temp > 100.5). 12/14/18   Zannie Cove, MD  famotidine (PEPCID) 10 MG tablet Take 10 mg by mouth 3 (three) times daily.    [provider]  ibuprofen (ADVIL) 200 MG tablet Take 800-1,000 mg by mouth every 6 (six) hours as  needed for moderate pain.    [provider]  loratadine (CLARITIN) 10 MG tablet Take 10 mg by mouth daily.    [provider]  Melatonin 5 MG TABS Take 5 mg by mouth at bedtime as needed (sleep).    [provider]  oxyCODONE (OXY IR/ROXICODONE) 5 MG immediate release tablet Take 1 tablet (5 mg total) by mouth every 6 (six) hours as needed for moderate pain. 12/14/18   Zannie Cove, MD  silver sulfADIAZINE (SILVADENE) 1 % cream Apply 1 application topically daily. Apply to left foot daily 01/05/19   Rayburn, Fanny Bien, PA-C  vitamin C (ASCORBIC ACID) 500 MG tablet Take 1,500 mg by mouth daily.    [provider]     Critical care time:    Patient critically ill due to respiratory failure, renal failure Interventions to address this today CRRT, mechanical ventilation Risk of deterioration without these interventions is high  I personally spent 34 minutes providing critical care not including any separately billable procedures  Myrla Halsted MD [facility] 06/07/2020 1:25 PM Personal pager: [phone] If unanswered, please page CCM On-call: #[phone]

## 2020-06-15 NOTE — ED Triage Notes (Addendum)
Patient arrived from home by [facility] after being found in bed. Family reports patient last seen 1 week ago and has hx of depression and drug use. Patient arrived cool to touch, IO established pta and being bagged on arrival. GCS 3. cbg 160. Pupils dilated on arrival, EMS also reports empty baggies in room-unknown what they contained. Pressure wound to left ankle

## 2020-06-15 NOTE — Progress Notes (Signed)
Called [facility] to notify about pt's hypotension, MAP dropped from mid 70s to 50s at this time. Pt has fluids running at 125cc/hour and will be transported to MRI soon. No PRN orders available at this time.

## 2020-06-15 NOTE — Progress Notes (Signed)
  Echocardiogram 2D Echocardiogram has been performed.  Gerda Diss 06/11/2020, 4:43 PM

## 2020-06-15 NOTE — ED Provider Notes (Signed)
Pt presents to the ED for altered mental status.  Family found pt unresponsive in the bed.  Hypotensive.  Dried secretions around mouth.  EMS transported. BVM ventilation.  IO established Physical Exam  BP 102/72   Pulse 99   Temp (!) 96.4 F (35.8 C)   Resp 14   Ht 1.88 m (6\' 2" )   Wt 90.7 kg   SpO2 100%   BMI 25.68 kg/m   Physical Exam Constitutional:      Appearance: He is ill-appearing and toxic-appearing.  HENT:     Head: Normocephalic and atraumatic.     Nose: No rhinorrhea.     Mouth/Throat:     Mouth: Mucous membranes are dry.     Comments: Mm dry, crusted secretions Eyes:     General:        Right eye: No discharge.        Left eye: No discharge.     Pupils: Pupils are equal, round, and reactive to light.  Cardiovascular:     Rate and Rhythm: Normal rate and regular rhythm.  Pulmonary:     Effort: Pulmonary effort is normal.     Breath sounds: No stridor. No wheezing.  Abdominal:     General: Abdomen is flat. There is no distension.  Genitourinary:    Penis: Normal.   Musculoskeletal:        General: No deformity or signs of injury.     Cervical back: No rigidity.     Comments: Appears to have pressure type skin ulcer left lower leg  Skin:    General: Skin is dry.  Neurological:     Comments: Moans , no verbal response, not following commands     ED Course/Procedures   Clinical Course as of Jun 15 1302  Sat Jun 15, 2020  1142 BP has improved with IV fluids.  Pt still not verbally responding to questions.  Moans.  Some movement of head but no movement of extremities noted.   [JK]  1143 ABG shows mild acidosis.  K and NA significantly elevated   [JK]  1151 Istat shows hyperkalemia, hypernatremia, uremia, renal failure.     [JK]    Clinical Course User Index [JK] Jun 17, 2020, MD    EKG Interpretation  Date/Time:  Saturday June 15 2020 10:31:18 EST Ventricular Rate:  92 PR Interval:    QRS Duration: 90 QT Interval:  345 QTC  Calculation: 427 R Axis:   49 Text Interpretation: Sinus rhythm Consider left atrial enlargement ST elev, probable normal early repol pattern No significant change since last tracing Confirmed by 01-20-1984 [phone]) on 06/04/2020 11:38:04 AM        .Critical Care Performed by: 06/17/2020, MD Authorized by: Linwood Dibbles, MD   Critical care provider statement:    Critical care time (minutes):  45   Critical care was time spent personally by me on the following activities:  Discussions with consultants, evaluation of patient's response to treatment, examination of patient, ordering and performing treatments and interventions, ordering and review of laboratory studies, ordering and review of radiographic studies, pulse oximetry, re-evaluation of patient's condition, obtaining history from patient or surrogate and review of old charts    MDM   No response to initial narcan.  Considered intubation initially but oxygenating normally and pt hypotensive.  Resuscitation started to optimize hemodynamics.  Intubation after medical resuscitation.  No difficulty with IJ line or intubation.  Transient hypotension treated with neosynephrine.  Mental status changes related to  uremia and AKI.  Hyperkalemia treated with bicarb, insulin, dextrose, calcium.  Plan on consult with nephrology and critical care.  CT head and c spine as limited movement noted lower extremities.  May be related to the mental status changes, renal failure and uremia but will check imaging test.   Nephrology , Dr Ronalee Belts evaluated pt at bedside.  Critical care service admitting.  I saw and evaluated the patient, reviewed the resident's note and I agree with the findings and plan.     Linwood Dibbles, MD 07-09-2020 1304

## 2020-06-15 NOTE — Progress Notes (Signed)
Pharmacy Antibiotic Note  Derek Jennings is a 34 y.o. male admitted on 06/22/2020. Pharmacy has been consulted for vancomycin and cefepime dosing for possible sepsis. Pt is afebrile and WBC is elevated at 14.5. Scr is very high at 17 and lactic acid is elevated at 3.1. Pt to start on CRRT.   Plan: Vancomycin 2gm IV x 1 then 1gm IV Q24H Cefepime 2gm IV Q12H F/u renal plans, C&S, clinical status and levels PRN  Height: 6\' 2"  (188 cm) Weight: 90.7 kg (200 lb) IBW/kg (Calculated) : 82.2  Temp (24hrs), Avg:96.5 F (35.8 C), Min:96 F (35.6 C), Max:96.9 F (36.1 C)  Recent Labs  Lab 06/24/2020 1120 06/19/2020 1141  WBC 14.5*  --   CREATININE 17.03* 17.00*  LATICACIDVEN 3.1*  --     Estimated Creatinine Clearance: 7.3 mL/min (A) (by C-G formula based on SCr of 17 mg/dL (H)).    Allergies  Allergen Reactions  . Amoxicillin Hives  . Penicillins Hives and Rash    Did it involve swelling of the face/tongue/throat, SOB, or low BP? Unknown Did it involve sudden or severe rash/hives, skin peeling, or any reaction on the inside of your mouth or nose? Unknown Did you need to seek medical attention at a hospital or doctor's office? Unknown When did it last happen? If all above answers are "NO", may proceed with cephalosporin use.     Antimicrobials this admission: Vanc 11/13>> Cefepime 11/13>> CTX x 1 11/13  Dose adjustments this admission: N/A  Microbiology results: Pending  Thank you for allowing pharmacy to be a part of this patient's care.  Dorinne Graeff, 12/13 06/06/2020 4:01 PM

## 2020-06-15 NOTE — ED Notes (Signed)
Dr Katrinka Blazing aware of Critical potassium and Lactic levels.

## 2020-06-15 NOTE — Procedures (Signed)
Central Venous Catheter Insertion Procedure Note  Derek Jennings  [id number]  02/01/1986  Date:July 02, 2020  Time:2:01 PM   Provider Performing:Kenzleigh Sedam Carmon Ginsberg Earlene Plater   Procedure: Insertion of Non-tunneled Central Venous Catheter(36556)with US guidance (06004)    Indication(s) Hemodialysis  Consent Unable to obtain consent due to emergent nature of procedure.  Anesthesia Topical only with 1% lidocaine   Timeout Verified patient identification, verified procedure, site/side was marked, verified correct patient position, special equipment/implants available, medications/allergies/relevant history reviewed, required imaging and test results available.  Sterile Technique Maximal sterile technique including full sterile barrier drape, hand hygiene, sterile gown, sterile gloves, mask, hair covering, sterile ultrasound probe cover (if used).  Procedure Description Area of catheter insertion was cleaned with chlorhexidine and draped in sterile fashion.   With real-time ultrasound guidance a HD catheter was placed into the right femoral vein.  Nonpulsatile blood flow and easy flushing noted in all ports.  The catheter was sutured in place and sterile dressing applied.  Complications/Tolerance None; patient tolerated the procedure well. Chest X-ray is ordered to verify placement for internal jugular or subclavian cannulation.  Chest x-ray is not ordered for femoral cannulation.  EBL Minimal  Specimen(s) None  Delfin Gant, NP-C [facility] Contact / Pager information can be found on Amion  07/02/20, 2:01 PM

## 2020-06-16 ENCOUNTER — Inpatient Hospital Stay (HOSPITAL_COMMUNITY): Payer: Self-pay

## 2020-06-16 DIAGNOSIS — R0689 Other abnormalities of breathing: Secondary | ICD-10-CM

## 2020-06-16 DIAGNOSIS — R609 Edema, unspecified: Secondary | ICD-10-CM

## 2020-06-16 DIAGNOSIS — F1910 Other psychoactive substance abuse, uncomplicated: Secondary | ICD-10-CM

## 2020-06-16 DIAGNOSIS — N19 Unspecified kidney failure: Secondary | ICD-10-CM

## 2020-06-16 DIAGNOSIS — G9349 Other encephalopathy: Secondary | ICD-10-CM

## 2020-06-16 DIAGNOSIS — D696 Thrombocytopenia, unspecified: Secondary | ICD-10-CM

## 2020-06-16 LAB — RENAL FUNCTION PANEL
Albumin: 2.7 g/dL — ABNORMAL LOW (ref 3.5–5.0)
Albumin: 2.7 g/dL — ABNORMAL LOW (ref 3.5–5.0)
Anion gap: 22 — ABNORMAL HIGH (ref 5–15)
Anion gap: 18 — ABNORMAL HIGH (ref 5–15)
BUN: 245 mg/dL — ABNORMAL HIGH (ref 6–20)
BUN: 140 mg/dL — ABNORMAL HIGH (ref 6–20)
CO2: 17 mmol/L — ABNORMAL LOW (ref 22–32)
CO2: 19 mmol/L — ABNORMAL LOW (ref 22–32)
Calcium: 8.3 mg/dL — ABNORMAL LOW (ref 8.9–10.3)
Calcium: 7.9 mg/dL — ABNORMAL LOW (ref 8.9–10.3)
Chloride: 119 mmol/L — ABNORMAL HIGH (ref 98–111)
Chloride: 108 mmol/L (ref 98–111)
Creatinine, Ser: 11.62 mg/dL — ABNORMAL HIGH (ref 0.61–1.24)
Creatinine, Ser: 6.28 mg/dL — ABNORMAL HIGH (ref 0.61–1.24)
GFR, Estimated: 5 mL/min — ABNORMAL LOW (ref >60)
GFR, Estimated: 11 mL/min — ABNORMAL LOW (ref >60)
Glucose, Bld: 129 mg/dL — ABNORMAL HIGH (ref 70–99)
Glucose, Bld: 123 mg/dL — ABNORMAL HIGH (ref 70–99)
Phosphorus: 10.2 mg/dL — ABNORMAL HIGH (ref 2.5–4.6)
Phosphorus: 6.4 mg/dL — ABNORMAL HIGH (ref 2.5–4.6)
Potassium: 6.3 mmol/L (ref 3.5–5.1)
Potassium: 4.9 mmol/L (ref 3.5–5.1)
Sodium: 158 mmol/L — ABNORMAL HIGH (ref 135–145)
Sodium: 145 mmol/L (ref 135–145)

## 2020-06-16 LAB — COMPREHENSIVE METABOLIC PANEL
ALT: 184 U/L — ABNORMAL HIGH (ref 0–44)
AST: 41 U/L (ref 15–41)
Albumin: 3.1 g/dL — ABNORMAL LOW (ref 3.5–5.0)
Alkaline Phosphatase: 55 U/L (ref 38–126)
Anion gap: 29 — ABNORMAL HIGH (ref 5–15)
BUN: >300 mg/dL — ABNORMAL HIGH (ref 6–20)
CO2: 16 mmol/L — ABNORMAL LOW (ref 22–32)
Calcium: 7.8 mg/dL — ABNORMAL LOW (ref 8.9–10.3)
Chloride: 114 mmol/L — ABNORMAL HIGH (ref 98–111)
Creatinine, Ser: 17.03 mg/dL — ABNORMAL HIGH (ref 0.61–1.24)
GFR, Estimated: 3 mL/min — ABNORMAL LOW (ref >60)
Glucose, Bld: 136 mg/dL — ABNORMAL HIGH (ref 70–99)
Potassium: >7.5 mmol/L (ref 3.5–5.1)
Sodium: 159 mmol/L — ABNORMAL HIGH (ref 135–145)
Total Bilirubin: 1.9 mg/dL — ABNORMAL HIGH (ref 0.3–1.2)
Total Protein: 6.4 g/dL — ABNORMAL LOW (ref 6.5–8.1)

## 2020-06-16 LAB — POCT I-STAT 7, (LYTES, BLD GAS, ICA,H+H)
Acid-base deficit: 6 mmol/L — ABNORMAL HIGH (ref 0.0–2.0)
Bicarbonate: 19.1 mmol/L — ABNORMAL LOW (ref 20.0–28.0)
Calcium, Ion: 1.07 mmol/L — ABNORMAL LOW (ref 1.15–1.40)
HCT: 46 % (ref 39.0–52.0)
Hemoglobin: 15.6 g/dL (ref 13.0–17.0)
O2 Saturation: 100 %
Potassium: 6.2 mmol/L — ABNORMAL HIGH (ref 3.5–5.1)
Sodium: 157 mmol/L — ABNORMAL HIGH (ref 135–145)
TCO2: 20 mmol/L — ABNORMAL LOW (ref 22–32)
pCO2 arterial: 36.5 mmHg (ref 32.0–48.0)
pH, Arterial: 7.326 — ABNORMAL LOW (ref 7.350–7.450)
pO2, Arterial: 199 mmHg — ABNORMAL HIGH (ref 83.0–108.0)

## 2020-06-16 LAB — CBC
HCT: 49.9 % (ref 39.0–52.0)
Hemoglobin: 16.3 g/dL (ref 13.0–17.0)
MCH: 31.2 pg (ref 26.0–34.0)
MCHC: 32.7 g/dL (ref 30.0–36.0)
MCV: 95.6 fL (ref 80.0–100.0)
Platelets: 60 10*3/uL — ABNORMAL LOW (ref 150–400)
RBC: 5.22 MIL/uL (ref 4.22–5.81)
RDW: 13.4 % (ref 11.5–15.5)
WBC: 16.8 10*3/uL — ABNORMAL HIGH (ref 4.0–10.5)
nRBC: 0.3 % — ABNORMAL HIGH (ref 0.0–0.2)

## 2020-06-16 LAB — PROTIME-INR
INR: 1.8 — ABNORMAL HIGH (ref 0.8–1.2)
Prothrombin Time: 20.6 seconds — ABNORMAL HIGH (ref 11.4–15.2)

## 2020-06-16 LAB — MAGNESIUM: Magnesium: 3.6 mg/dL — ABNORMAL HIGH (ref 1.7–2.4)

## 2020-06-16 LAB — CK TOTAL AND CKMB (NOT AT ARMC)
CK, MB: 36.1 ng/mL — ABNORMAL HIGH (ref 0.5–5.0)
Relative Index: 0.9 (ref 0.0–2.5)
Total CK: 3990 U/L — ABNORMAL HIGH (ref 49–397)

## 2020-06-16 LAB — HEPARIN LEVEL (UNFRACTIONATED): Heparin Unfractionated: 1.1 IU/mL — ABNORMAL HIGH (ref 0.30–0.70)

## 2020-06-16 MED ORDER — PHENYLEPHRINE CONCENTRATED 100MG/250ML (0.4 MG/ML) INFUSION SIMPLE
0.0000 ug/min | INTRAVENOUS | Status: DC
  Administered 2020-06-16: 100 ug/min via INTRAVENOUS
  Filled 2020-06-16 (×2): qty 250

## 2020-06-16 MED ORDER — STERILE WATER FOR INJECTION IV SOLN
INTRAVENOUS | Status: DC
  Filled 2020-06-16 (×4): qty 150

## 2020-06-16 MED ORDER — PRISMASOL BGK 0/2.5 32-2.5 MEQ/L EC SOLN
Status: DC
  Filled 2020-06-16 (×3): qty 5000

## 2020-06-16 MED ORDER — SODIUM CHLORIDE 0.9 % IV SOLN
INTRAVENOUS | Status: DC | PRN
  Administered 2020-06-16: 10 mL/h via INTRAVENOUS
  Administered 2020-06-22: 1000 mL via INTRAVENOUS

## 2020-06-16 MED ORDER — FENTANYL 2500MCG IN NS 250ML (10MCG/ML) PREMIX INFUSION
0.0000 ug/h | INTRAVENOUS | Status: DC
  Administered 2020-06-16: 25 ug/h via INTRAVENOUS
  Filled 2020-06-16: qty 250

## 2020-06-16 MED ORDER — HEPARIN BOLUS VIA INFUSION
4000.0000 [IU] | Freq: Once | INTRAVENOUS | Status: AC
  Administered 2020-06-16: 4000 [IU] via INTRAVENOUS
  Filled 2020-06-16: qty 4000

## 2020-06-16 MED ORDER — VASOPRESSIN 20 UNITS/100 ML INFUSION FOR SHOCK
0.0000 [IU]/min | INTRAVENOUS | Status: DC
  Administered 2020-06-16 (×2): 0.03 [IU]/min via INTRAVENOUS
  Administered 2020-06-17: 0.02 [IU]/min via INTRAVENOUS
  Filled 2020-06-16 (×3): qty 100

## 2020-06-16 MED ORDER — SODIUM ZIRCONIUM CYCLOSILICATE 10 G PO PACK
10.0000 g | PACK | Freq: Once | ORAL | Status: DC
Start: 2020-06-16 — End: 2020-06-16

## 2020-06-16 MED ORDER — HEPARIN (PORCINE) 25000 UT/250ML-% IV SOLN
1250.0000 [IU]/h | INTRAVENOUS | Status: DC
  Administered 2020-06-16: 1250 [IU]/h via INTRAVENOUS
  Administered 2020-06-17 – 2020-06-18 (×2): 1000 [IU]/h via INTRAVENOUS
  Administered 2020-06-19: 1150 [IU]/h via INTRAVENOUS
  Administered 2020-06-20 – 2020-06-24 (×6): 1250 [IU]/h via INTRAVENOUS
  Filled 2020-06-16 (×11): qty 250

## 2020-06-16 NOTE — Progress Notes (Signed)
Returned back to 2H24 from MRI, uneventful trip, pt remained on neo gtt throughout the test, slightly hypotensive, did receive one dose of PRN versed for biting ETT. RRT, transporter and RN at bedside.

## 2020-06-16 NOTE — Progress Notes (Addendum)
[facility] Physician-Brief Progress Note Patient Name: Derek Jennings DOB: 18-Apr-1986 MRN: [id number]   Date of Service  06/16/2020  HPI/Events of Note  Agitation - Patient biting down on ETT and not getting volumes at times.   eICU Interventions  Plan: 1. Fentanyl IV infusion. Titrate to RASS = 0 to -1.      Intervention Category Major Interventions: Delirium, psychosis, severe agitation - evaluation and management  [name] 06/16/2020, 2:06 AM

## 2020-06-16 NOTE — Progress Notes (Signed)
NAME:  Derek Jennings, MRN:  [id number], DOB:  [birth year], LOS: 1 ADMISSION DATE:  06-24-20, CONSULTATION DATE: 11/13 REFERRING MD:  Dr. Lynelle Doctor, CHIEF COMPLAINT: Acute renal failure  Brief History   34 year old male presents to ED via EMS after being found minimally responsive by family.  Last seen normal 1 week prior to admission.  Known history of depression and drug abuse.  PCCM consulted for further management and admission  History of present illness   History and physical obtained per chart review and verbal report given patient is currently encephalopathic and intubated  [address] is a 34 year old male with past medical history significant depression and drug abuse who presented to the emergency department with altered mental status after being found minimally responsive by family. Per chart review patient was last seen normal 1 week prior to admission.  There is reportedly history of depression and drug abuse.  On arrival patient was being bagged due to severely depressed mentation with a GCS of 3.  EMS reports possible drug paraphernalia on site.  Urine toxicology positive for amphetamines.  Full laboratory evaluation pending at time of assessment, relevant reported significant lab work thus far included sodium 155, potassium 7.5, glucose 122, BUN greater than 130, creatinine 17, ionized calcium 0.85, white blood cell count 14.5, and INR 22.  Chest x-ray relatively unremarkable.  Given severe renal failure with a creatinine of 17 and hyperkalemia nephrology was consulted.  Patient will need emergent hemodialysis.  Also concern for severe rhabdomyolysis, CK-MB currently pending.  Additionally patient was electively intubated in the emergency department due to depressed GCS.  PCCM consulted for further management and admission.  Past Medical History  Depression Drug abuse  Significant Hospital Events   Admitted 11/13 required intubation for airway protection  Consults:   Nephrology  Procedures:    Significant Diagnostic Tests:  Chest x-ray 11/13 > Subtle patchy airspace disease at the left base.  Micro Data:  COVID 11/13 > Blood cultures 11/13 >  Antimicrobials:     Interim history/subjective:  Required pressor support overnight Minimally responsive on ventilator  Objective   Blood pressure 102/66, pulse 85, temperature (!) 97.5 F (36.4 C), temperature source Oral, resp. rate 14, height 6\' 2"  (1.88 m), weight 88.5 kg, SpO2 100 %. CVP:  [0 mmHg-9 mmHg] 5 mmHg  Vent Mode: PRVC FiO2 (%):  [40 %-80 %] 40 % Set Rate:  [14 bmp] 14 bmp Vt Set:  [660 mL] 660 mL PEEP:  [5 cmH20] 5 cmH20 Plateau Pressure:  [15 cmH20-19 cmH20] 17 cmH20   Intake/Output Summary (Last 24 hours) at 06/16/2020 0749 Last data filed at 06/16/2020 0700 Gross per 24 hour  Intake 3255.54 ml  Output 1256 ml  Net 1999.54 ml   Filed Weights   06-24-20 1040 24-Jun-2020 2100 06/16/20 0400  Weight: 90.7 kg 88.8 kg 88.5 kg    Examination: General: Adult thin elderly male lying in bed on mechanical ventilation in no acute distress HEENT: [place]/AT, MM pink/moist, PERRL,  Neuro: Minimally responsive on ventilator, 6 feet facial twitch to painful stimuli CV: s1s2 regular rate and rhythm, no murmur, rubs, or gallops,  PULM: Mechanical breath sounds, no added breath sounds, tolerating ventilator well GI: soft, bowel sounds active in all 4 quadrants, non-tender, non-distended, tolerating TF Extremities: warm/dry, no edema  Skin: no rashes or lesions  Resolved Hospital Problem list     Assessment & Plan:  Acute uremic encephalopathy -Found unresponsive by family after approximately 1 week downtime Acute renal failure with  hyperkalemia -Likely secondary to severe dehydration/hypotension  -Creatinine 0.93 on 12/15/2018.  Creatinine on admission 17.03, BUN 315 Hyperkalemia -Potassium 6.4 on arrival, received temporizing agents including Lokelma P: Nephrology consulted,  appreciate assistance Patient initiated on emergent CRRT overnight Follow renal function / urine output Trend Bmet Avoid nephrotoxins Ensure adequate renal perfusion  IV hydration Renal ultrasound relatively unremarkable Correct hyperkalemia per CRRT orders  Acute hypoxemic respiratory failure due to inability to protect airway -In the setting of prolonged downtime and acute uremic encephalopathy.  Questionable overdose given polysubstance abuse history -Patient was positive for amphetamines on arrival, and family verifies long history of substance abuse P: Continue ventilator support with lung protective strategies  Wean PEEP and FiO2 for sats greater than 90%. Head of bed elevated 30 degrees. Plateau pressures less than 30 cm H20.  Follow intermittent chest x-ray and ABG.   SAT/SBT as tolerated, mentation preclude extubation  Ensure adequate pulmonary hygiene  Follow cultures  VAP bundle in place  PAD protocol  Concern for possible cerebellum infarct and anoxic injury  -Brain MRI obtained overnight 11/14 with diffuse white matter abnormality most consistent with hypoxic/ischemic injury -Patient had prolonged downtime P: Neurology following, appreciate assistance Patient had prolonged downtime with likely an adequate ventilation/oxygenation Appears he became responsive to pain overnight with need for sedation on ventilator Correct underlying metabolic derangements Minimize sedation as able Supportive measures as above and below  Thrombocytopenia, INR elevation P: Follow-up DIC panel Trend Monitor for signs of bleeding  Hx polysubstance abuse - Like etiology for most of issues above P: Cessation education when appropriate  Best practice:  Diet: N.p.o., consider initiation of tube feeds soon Pain/Anxiety/Delirium protocol (if indicated): Pad protocol VAP protocol (if indicated): In place DVT prophylaxis: SCDs will initiate pharmacological DVT prophylaxis once head CT  resulted GI prophylaxis: PPI Glucose control: SSI Mobility: Bedrest Code Status: Full code Family Communication: We will update once identified Disposition: ICU  Labs   CBC: Recent Labs  Lab 06/09/2020 1120 06/11/2020 1141 06/22/2020 1427 06/20/2020 1740 06/26/2020 1801 06/16/20 0420 06/16/20 0431  WBC 14.5*  --   --  17.6*  --  16.8*  --   NEUTROABS 12.5*  --   --   --   --   --   --   HGB 16.1   < > 6.5* 16.0 15.6 16.3 15.6  HCT 50.7   < > 19.0* 49.2 46.0 49.9 46.0  MCV 98.8  --   --  95.2  --  95.6  --   PLT 66*  --   --  58*  58*  --  60*  --    < > = values in this interval not displayed.    Basic Metabolic Panel: Recent Labs  Lab 06/07/2020 1120 06/09/2020 1120 07/02/2020 1141 06/17/2020 1141 06/12/2020 1427 06/30/2020 1740 06/14/2020 1801 06/16/20 0420 06/16/20 0431  NA 159*   < > 155*   < > 161* 158* 157* 158* 157*  K >7.5*   < > 7.5*   < > 2.8* 6.4* 6.4* 6.3* 6.2*  CL 114*  --  120*  --   --  114*  --  119*  --   CO2 16*  --   --   --   --  17*  --  17*  --   GLUCOSE 136*  --  122*  --   --  139*  --  129*  --   BUN 315*  --  >130*  --   --  295*  --  245*  --   CREATININE 17.03*  --  17.00*  --   --  14.50*  --  11.62*  --   CALCIUM 7.8*  --   --   --   --  7.9*  --  8.3*  --   MG  --   --   --   --   --  4.1*  --  3.6*  --   PHOS  --   --   --   --   --  >30.0*  --  10.2*  --    < > = values in this interval not displayed.   GFR: Estimated Creatinine Clearance: 10.6 mL/min (A) (by C-G formula based on SCr of 11.62 mg/dL (H)). Recent Labs  Lab 06/24/2020 1120 06/25/2020 1740 06/16/20 0420  WBC 14.5* 17.6* 16.8*  LATICACIDVEN 3.1* 2.0*  --     Liver Function Tests: Recent Labs  Lab 07/01/2020 1120 06/16/20 0420  AST 41  --   ALT 184*  --   ALKPHOS 55  --   BILITOT 1.9*  --   PROT 6.4*  --   ALBUMIN 3.1* 2.7*   No results for input(s): LIPASE, AMYLASE in the last 168 hours. Recent Labs  Lab 06/25/2020 1831  AMMONIA 41*    ABG    Component Value Date/Time    PHART 7.326 (L) 06/16/2020 0431   PCO2ART 36.5 06/16/2020 0431   PO2ART 199 (H) 06/16/2020 0431   HCO3 19.1 (L) 06/16/2020 0431   TCO2 20 (L) 06/16/2020 0431   ACIDBASEDEF 6.0 (H) 06/16/2020 0431   O2SAT 100.0 06/16/2020 0431     Coagulation Profile: Recent Labs  Lab 06/11/2020 1120 07/01/2020 1740  INR 2.2* 2.0*  2.0    Cardiac Enzymes: Recent Labs  Lab 06/27/2020 1120  CKTOTAL 2,353*    HbA1C: Hgb A1c MFr Bld  Date/Time Value Ref Range Status  12/11/2018 04:22 AM 5.5 4.8 - 5.6 % Final    Comment:    (NOTE) Pre diabetes:          5.7%-6.4% Diabetes:              >6.4% Glycemic control for   <7.0% adults with diabetes     CBG: Recent Labs  Lab 06/11/2020 1030  GLUCAP 127*    Critical care time:    Performed by: Delfin Gant  Total critical care time: 43 minutes  Critical care time was exclusive of separately billable procedures and treating other patients.  Critical care was necessary to treat or prevent imminent or life-threatening deterioration.  Critical care was time spent personally by me on the following activities: development of treatment plan with patient and/or surrogate as well as nursing, discussions with consultants, evaluation of patient's response to treatment, examination of patient, obtaining history from patient or surrogate, ordering and performing treatments and interventions, ordering and review of laboratory studies, ordering and review of radiographic studies, pulse oximetry and re-evaluation of patient's condition.  Delfin Gant, NP-C [facility] Contact / Pager information can be found on Amion  06/16/2020, 7:56 AM

## 2020-06-16 NOTE — Progress Notes (Signed)
eLink Physician-Brief Progress Note Patient Name: Abrian Hanover DOB: [birth year] MRN: [id number]   Date of Service  06/16/2020  HPI/Events of Note  Nursing concerned about possible compartment syndrome with bilateral  DVT's with dusky appearance with very faint pulses and evaluation by the  ground team.  eICU Interventions  Ground team notified of Nursing request.     Intervention Category Major Interventions: Other:  Lenell Antu 06/16/2020, 8:52 PM

## 2020-06-16 NOTE — Progress Notes (Signed)
ANTICOAGULATION CONSULT NOTE   Pharmacy Consult for heparin Indication: DVT  Allergies  Allergen Reactions  . Amoxicillin Hives  . Penicillins Hives and Rash    Did it involve swelling of the face/tongue/throat, SOB, or low BP? Unknown Did it involve sudden or severe rash/hives, skin peeling, or any reaction on the inside of your mouth or nose? Unknown Did you need to seek medical attention at a hospital or doctor's office? Unknown When did it last happen? If all above answers are "NO", may proceed with cephalosporin use.     Patient Measurements: Height: 6\' 2"  (188 cm) Weight: 88.5 kg (195 lb 1.7 oz) IBW/kg (Calculated) : 82.2 Heparin Dosing Weight: 88kg  Vital Signs: Temp: 99.7 F (37.6 C) (11/14 2000) Temp Source: Bladder (11/14 2000) BP: 77/39 (11/14 2000)  Labs: Recent Labs    06/24/2020 1120 07/02/2020 1141 06/08/2020 1740 06/08/2020 1740 07/01/2020 1801 06/21/2020 1801 06/08/2020 2032 06/16/20 0420 06/16/20 0431 06/16/20 1405 06/16/20 2015  HGB 16.1   < > 16.0   < > 15.6   < >  --  16.3 15.6  --   --   HCT 50.7   < > 49.2   < > 46.0  --   --  49.9 46.0  --   --   PLT 66*  --  58*  58*  --   --   --   --  60*  --   --   --   APTT  --   --  33  32  --   --   --   --   --   --   --   --   LABPROT 23.4*  --  22.2*  22.3  --   --   --   --   --   --  20.6*  --   INR 2.2*  --  2.0*  2.0  --   --   --   --   --   --  1.8*  --   HEPARINUNFRC  --   --   --   --   --   --   --   --   --   --  1.10*  CREATININE 17.03*   < > 14.50*  --   --   --   --  11.62*  --  6.28*  --   CKTOTAL 2,353*  --   --   --   --   --   --   --   --  3,990*  --   CKMB  --   --   --   --   --   --   --   --   --  36.1*  --   TROPONINIHS  --   --  7  --   --   --  6  --   --   --   --    < > = values in this interval not displayed.    Estimated Creatinine Clearance: 19.6 mL/min (A) (by C-G formula based on SCr of 6.28 mg/dL (H)).   Medical History: Past Medical History:  Diagnosis Date   . ADD (attention deficit disorder)   . Allergy   . Hyperhidrosis     Assessment: 34 year old male found down, minimally responsive by family. Originally thought to be in DIC, hgb currently stable, plt low at 60. D-dimer>20, fibrinogen low at 63. New large bilateral DVTs noted on dopplers.  Pharmacy dosing heparin  -heparin level = 1.1     Goal of Therapy:  Heparin level 0.3-0.7 units/ml Monitor platelets by anticoagulation protocol: Yes   Plan:  -Decrease heparin to 1000 units/hr -Heparin level in 8 hours and daily wth CBC daily  Harland German, PharmD Clinical Pharmacist **Pharmacist phone directory can now be found on [email or web address] ([email or web address]).  Listed under [facility].

## 2020-06-16 NOTE — Progress Notes (Addendum)
[facility] NEPHROLOGY PROGRESS NOTE  Assessment/ Plan: Pt is a 34 y.o. yo male  with history of ADHD who was brought in by EMS after being found down on the floor, minimal responsiveness with a GCS of 3, seen as a consultation at the request of ER physician for severe hyperkalemia and acute kidney injury (BUN 315, Cr 17, K >7.5).   #Acute kidney injury likely ATN in the setting of severe dehydration/hypotension, rhabdomyolysis after found down on the floor.  UA without proteinuria.  Kidney ultrasound consistent with increased echogenicity without acute finding. Started CRRT for multiple electrolytes abnormalities including hyperkalemia.  Potassium level is still elevated therefore increasing dialysate flow and prefilter blood flow.  Both as 2K bath.  Monitor lab. Strict ins and out, maintain MAP more than 65.  #Severe hyperkalemia with advanced renal failure: Now managed with CRRT, prescription changed as above.  Monitor lab.  #Unresponsive/acute metabolic encephalopathy: MRI of the brain without hemorrhage or mass-effect however has hypoxic ischemic injury.  Seen by neurology.  #Acute respiratory failure with hypoxia: intubated.  Manage by PCCM.  #Hypernatremia: Due to dehydration.  Receiving fluid resuscitation, start free water from the tube.  No ultrafiltration.  #Acidosis: Sodium bicarbonate on post filter fluid.  Maintain MAP.  Antibiotics treatment with primary team.  Prognosis guarded.  Discussed with ICU team.  Subjective: Seen and examined in ICU.  Remains intubated, sedated.  Tolerating CRRT well.  Urine output around 720 cc. Objective Vital signs in last 24 hours: Vitals:   06/16/20 0630 06/16/20 0645 06/16/20 0700 06/16/20 0737  BP:      Pulse:    85  Resp: _0 Temp: (!) 97.5 F (36.4 C)     TempSrc: Oral     SpO2: 100% 100% 100% 100%  Weight:      Height:       Weight change:   Intake/Output Summary (Last 24 hours) at 06/16/2020  0848 Last data filed at 06/16/2020 0700 Gross per 24 hour  Intake 3255.54 ml  Output 1256 ml  Net 1999.54 ml       Labs: Basic Metabolic Panel: Recent Labs  Lab 07/01/2020 1120 06/30/2020 1120 06/21/2020 1141 06/08/2020 1427 06/04/2020 1740 06/16/2020 1740 06/03/2020 1801 06/16/20 0420 06/16/20 0431  NA 159*   < > 155*   < > 158*   < > 157* 158* 157*  K >7.5*   < > 7.5*   < > 6.4*   < > 6.4* 6.3* 6.2*  CL 114*   < > 120*  --  114*  --   --  119*  --   CO2 16*  --   --   --  17*  --   --  17*  --   GLUCOSE 136*   < > 122*  --  139*  --   --  129*  --   BUN 315*   < > >130*  --  295*  --   --  245*  --   CREATININE 17.03*   < > 17.00*  --  14.50*  --   --  11.62*  --   CALCIUM 7.8*  --   --   --  7.9*  --   --  8.3*  --   PHOS  --   --   --   --  >30.0*  --   --  10.2*  --    < > = values in this interval not displayed.  Liver Function Tests: Recent Labs  Lab 06/03/2020 1120 06/16/20 0420  AST 41  --   ALT 184*  --   ALKPHOS 55  --   BILITOT 1.9*  --   PROT 6.4*  --   ALBUMIN 3.1* 2.7*   No results for input(s): LIPASE, AMYLASE in the last 168 hours. Recent Labs  Lab 06/07/2020 1831  AMMONIA 41*   CBC: Recent Labs  Lab 06/21/2020 1120 06/06/2020 1141 06/19/2020 1740 06/06/2020 1740 06/27/2020 1801 06/16/20 0420 06/16/20 0431  WBC 14.5*  --  17.6*  --   --  16.8*  --   NEUTROABS 12.5*  --   --   --   --   --   --   HGB 16.1   < > 16.0   < > 15.6 16.3 15.6  HCT 50.7   < > 49.2   < > 46.0 49.9 46.0  MCV 98.8  --  95.2  --   --  95.6  --   PLT 66*  --  58*   58*  --   --  60*  --    < > = values in this interval not displayed.   Cardiac Enzymes: Recent Labs  Lab 06/24/2020 1120  CKTOTAL 2,353*   CBG: Recent Labs  Lab 06/27/2020 1030  GLUCAP 127*    Iron Studies: No results for input(s): IRON, TIBC, TRANSFERRIN, FERRITIN in the last 72 hours. Studies/Results: CT Head Wo Contrast  Result Date: 06/29/2020 CLINICAL DATA:  Head trauma.  Mental status change. EXAM: CT  HEAD WITHOUT CONTRAST TECHNIQUE: Contiguous axial images were obtained from the base of the skull through the vertex without intravenous contrast. COMPARISON:  None. FINDINGS: Brain: Abnormal area of mixed attenuation, predominantly hypoattenuated with speckles of hyperattenuation within the right cerebellum measures 2.8 cm. There is no evidence of hydrocephalus or mass effect. The ventricular system is normal. Vascular: No hyperdense vessel or unexpected calcification. Skull: Normal. Negative for fracture or focal lesion. Sinuses/Orbits: Complete opacification of the right maxillary sinus. Partial opacification of the right ethmoid sinus. Normal appearance of the orbits. Other: Right parietal scalp hematoma. IMPRESSION: 1. Abnormal area of mixed attenuation, predominantly hypoattenuated with speckles of hyperattenuation within the right cerebellum measures 2.8 cm. This may represent an acute intraparenchymal hemorrhage, a hemorrhagic infarct, or potentially a hemorrhagic mass. Further evaluation with MRI of the brain may be considered. 2. Right parietal scalp hematoma. 3. Right maxillary and ethmoid sinusitis. Critical Value/emergent results were called by telephone at the time of interpretation on 06/10/2020 at 3:03 pm to provider Dr. Gwenyth Ober, who verbally acknowledged these results. Electronically Signed   By: Fidela Salisbury M.D.   On: 06/29/2020 15:04   CT Cervical Spine Wo Contrast  Result Date: 06/03/2020 CLINICAL DATA:  Found unresponsive. EXAM: CT CERVICAL SPINE WITHOUT CONTRAST TECHNIQUE: Multidetector CT imaging of the cervical spine was performed without intravenous contrast. Multiplanar CT image reconstructions were also generated. COMPARISON:  None. FINDINGS: Alignment: Normal. Skull base and vertebrae: No acute fracture. No primary bone lesion or focal pathologic process. Soft tissues and spinal canal: No prevertebral fluid or swelling. No visible canal hematoma. Disc levels:  Normal.  Upper chest: Negative. Other: Endotracheal tube and enteric catheter noted. IMPRESSION: No evidence of acute traumatic injury to the cervical spine. Electronically Signed   By: Fidela Salisbury M.D.   On: 06/05/2020 15:13   MR BRAIN WO CONTRAST  Result Date: 06/16/2020 CLINICAL DATA:  Suspected drug overdose.  Found unconscious. EXAM:  MRI HEAD WITHOUT CONTRAST TECHNIQUE: Multiplanar, multiecho pulse sequences of the brain and surrounding structures were obtained without intravenous contrast. COMPARISON:  Head CT 06/25/2020 FINDINGS: Brain: There is diffuse white matter abnormality on diffusion-weighted imaging throughout both cerebral and cerebellar hemispheres. Edema is worst in the right cerebellar hemisphere. There is no extra-axial collection. No midline shift or other mass effect. Vascular: The major intracranial arterial and venous sinus flow voids are preserved. Skull and upper cervical spine: There is a small subgaleal hematoma at the scalp vertex. Sinuses/Orbits: Opacification of the right maxillary sinus. Normal orbits Other: None IMPRESSION: 1. Diffuse white matter abnormality on diffusion-weighted imaging throughout both cerebral and cerebellar hemispheres, most consistent with hypoxic ischemic injury. 2. No hemorrhage or mass effect. Electronically Signed   By: Ulyses Jarred M.D.   On: 06/16/2020 00:42   US RENAL  Result Date: 06/25/2020 CLINICAL DATA:  Acute renal insufficiency EXAM: RENAL / URINARY TRACT ULTRASOUND COMPLETE COMPARISON:  None. FINDINGS: Right Kidney: Renal measurements: 10.6 x 4.9 x 5.8 cm = volume: 155.1 mL. Mild increased renal cortical echotexture. No mass or hydronephrosis. Left Kidney: Renal measurements: 10.1 x 4.9 x 5.0 cm = volume: 129.0 mL. Mild increased renal cortical echotexture. No mass or hydronephrosis. Bladder: Decompressed, Foley catheter in place. Other: None. IMPRESSION: 1. Mild bilateral increased renal cortical echotexture consistent with medical renal  disease. Electronically Signed   By: Randa Ngo M.D.   On: 06/06/2020 15:34   DG Chest Portable 1 View  Result Date: 06/05/2020 CLINICAL DATA:  Endotracheal tube placement, respiratory failure EXAM: PORTABLE CHEST 1 VIEW COMPARISON:  On 06/04/2020 at 10:44 a.m. FINDINGS: Left IJ line tip: Brachiocephalic vein confluence. Endotracheal tube tip is 4.5 cm above the carina and satisfactorily positioned. A nasogastric tube enters the stomach. The patient is rotated to the right on today's radiograph, reducing diagnostic sensitivity and specificity. No pneumothorax. The lungs appear clear. No blunting of the costophrenic angles. IMPRESSION: 1. Left IJ line tip: Brachiocephalic vein confluence. 2. Endotracheal tube tip 4.5 cm above the carina and satisfactorily positioned. Electronically Signed   By: Van Clines M.D.   On: 06/13/2020 13:15   DG Chest Port 1 View  Result Date: 06/27/2020 CLINICAL DATA:  Possible overdose.  Unresponsive. EXAM: PORTABLE CHEST 1 VIEW COMPARISON:  02/09/2014 FINDINGS: Patient is rotated to the right. Low volume film with mild vascular congestion. Subtle patchy airspace disease noted left base. Cardiopericardial silhouette is at upper limits of normal for size. Left IJ central line tip overlies the innominate vein confluence. No evidence for pneumothorax or pleural effusion. Gaseous distention of bowel in the left abdomen likely stomach and colon. IMPRESSION: Subtle patchy airspace disease at the left base. Otherwise no acute cardiopulmonary findings. Electronically Signed   By: Misty Stanley M.D.   On: 06/22/2020 11:57   ECHOCARDIOGRAM COMPLETE  Result Date: 06/07/2020    ECHOCARDIOGRAM REPORT   Patient Name:   [facility] [place] Date of Exam: 06/11/2020 Medical Rec #:  [id number]    Height:       74.0 in Accession #:    [id number]   Weight:       200.0 lb Date of Birth:  05-Nov-1985   BSA:          2.173 m Patient Age:    34 years     BP:           126/76 mmHg Patient  Gender: M            HR:  91 bpm. Exam Location:  Inpatient Procedure: 2D Echo, Cardiac Doppler and Color Doppler Indications:    Dyspnea  History:        Patient has no prior history of Echocardiogram examinations.                 Risk Factors:Current Smoker. Polysubstance abuse.  Sonographer:    Clayton Lefort RDCS (AE) Referring Phys: [phone] [facility] F DAVIS  Sonographer Comments: Technically difficult study due to poor echo windows, suboptimal parasternal window, suboptimal apical window and echo performed with patient supine and on artificial respirator. Image acquisition challenging due to respiratory motion. IMPRESSIONS  1. Left ventricular ejection fraction, by estimation, is 60 to 65%. The left ventricle has normal function. The left ventricle has no regional wall motion abnormalities. There is mild left ventricular hypertrophy. Left ventricular diastolic parameters are consistent with Grade I diastolic dysfunction (impaired relaxation).  2. Right ventricular systolic function is normal. The right ventricular size is normal.  3. The mitral valve is normal in structure. No evidence of mitral valve regurgitation. No evidence of mitral stenosis.  4. The aortic valve has an indeterminant number of cusps. Aortic valve regurgitation is not visualized. No aortic stenosis is present.  5. Aortic dilatation noted. There is moderate dilatation of the ascending aorta, measuring 45 mm.  6. The inferior vena cava is normal in size with greater than 50% respiratory variability, suggesting right atrial pressure of 3 mmHg. FINDINGS  Left Ventricle: Left ventricular ejection fraction, by estimation, is 60 to 65%. The left ventricle has normal function. The left ventricle has no regional wall motion abnormalities. The left ventricular internal cavity size was normal in size. There is  mild left ventricular hypertrophy. Left ventricular diastolic parameters are consistent with Grade I diastolic dysfunction (impaired  relaxation). Right Ventricle: The right ventricular size is normal. Right ventricular systolic function is normal. Left Atrium: Left atrial size was normal in size. Right Atrium: Right atrial size was normal in size. Pericardium: There is no evidence of pericardial effusion. Mitral Valve: The mitral valve is normal in structure. No evidence of mitral valve regurgitation. No evidence of mitral valve stenosis. Tricuspid Valve: The tricuspid valve is normal in structure. Tricuspid valve regurgitation is not demonstrated. No evidence of tricuspid stenosis. Aortic Valve: The aortic valve has an indeterminant number of cusps. Aortic valve regurgitation is not visualized. No aortic stenosis is present. Aortic valve mean gradient measures 3.0 mmHg. Aortic valve peak gradient measures 4.3 mmHg. Aortic valve area, by VTI measures 2.16 cm. Pulmonic Valve: The pulmonic valve was not well visualized. Pulmonic valve regurgitation is not visualized. No evidence of pulmonic stenosis. Aorta: Aortic dilatation noted. There is moderate dilatation of the ascending aorta, measuring 45 mm. Venous: The inferior vena cava is normal in size with greater than 50% respiratory variability, suggesting right atrial pressure of 3 mmHg.   LEFT VENTRICLE PLAX 2D LVIDd:         3.00 cm  Diastology LVIDs:         1.80 cm  LV e' medial:    6.85 cm/s LV PW:         1.10 cm  LV E/e' medial:  8.4 LV IVS:        1.30 cm  LV e' lateral:   5.87 cm/s LVOT diam:     1.80 cm  LV E/e' lateral: 9.8 LV SV:         37 LV SV Index:   17 LVOT Area:  2.54 cm  RIGHT VENTRICLE             IVC RV S prime:     16.10 cm/s  IVC diam: 1.80 cm TAPSE (M-mode): 1.8 cm LEFT ATRIUM           Index      RIGHT ATRIUM          Index LA diam:      1.20 cm 0.55 cm/m RA Area:     9.17 cm LA Vol (A4C): 11.0 ml 5.06 ml/m RA Volume:   15.50 ml 7.13 ml/m  AORTIC VALVE AV Area (Vmax):    2.20 cm AV Area (Vmean):   2.05 cm AV Area (VTI):     2.16 cm AV Vmax:           104.00  cm/s AV Vmean:          86.800 cm/s AV VTI:            0.173 m AV Peak Grad:      4.3 mmHg AV Mean Grad:      3.0 mmHg LVOT Vmax:         90.10 cm/s LVOT Vmean:        69.900 cm/s LVOT VTI:          0.147 m LVOT/AV VTI ratio: 0.85  AORTA Ao Root diam: 3.00 cm MITRAL VALVE MV Area (PHT): 3.72 cm    SHUNTS MV Decel Time: 204 msec    Systemic VTI:  0.15 m MV E velocity: 57.40 cm/s  Systemic Diam: 1.80 cm MV A velocity: 62.60 cm/s MV E/A ratio:  0.92 Kirk Ruths MD Electronically signed by Kirk Ruths MD Signature Date/Time: 07/02/2020/4:56:42 PM    Final     Medications: Infusions:  sodium chloride 125 mL/hr at 06/16/20 0700   ceFEPime (MAXIPIME) IV Stopped (06/16/20 0453)   fentaNYL infusion INTRAVENOUS 125 mcg/hr (06/16/20 0700)   phenylephrine (NEO-SYNEPHRINE) Adult infusion 60 mcg/min (06/16/20 0700)   prismasol BGK 2/2.5 dialysis solution 1,500 mL/hr at 06/16/20 0604   prismasol BGK 2/2.5 replacement solution     sodium bicarbonate (isotonic) 1000 mL infusion 150 mL/hr at 06/16/20 0702   vancomycin      Scheduled Medications:  chlorhexidine gluconate (MEDLINE KIT)  15 mL Mouth Rinse BID   Chlorhexidine Gluconate Cloth  6 each Topical Daily   mouth rinse  15 mL Mouth Rinse 10 times per day   pantoprazole (PROTONIX) IV  40 mg Intravenous QHS   sodium chloride flush  10-40 mL Intracatheter Q12H    have reviewed scheduled and prn medications.  Physical Exam: General: Critically ill looking male with dry mucous membrane, sedated, intubated Heart:RRR, s1s2 nl Lungs: Coarse breath sound bilateral Abdomen:soft, Non-tender, non-distended Extremities: Multiple necrotic skin changes in lower extremity Dialysis Access: Right femoral temporary HD catheter placed by ICU on 11/13.  Kendahl Bumgardner Prasad Ples Trudel 06/16/2020,8:48 AM  LOS: 1 day  Pager: [phone]

## 2020-06-16 NOTE — Progress Notes (Signed)
Neurology Progress Note   S:// No neuro events overnight.  Patient is critically ill laying in bed on CRRT. Patient is currently on neo and vasopressin drips. Has not received any sedation since this early this am.  RN at bedside.  On exam patient is attempting to open eyes to noxious stimuli. Does not track, or follow commands No family at the bedside    O:// Current vital signs: BP 107/70   Pulse 85   Temp 98.8 F (37.1 C)   Resp 16   Ht $R'6\' 2"'Pj$  (1.88 m)   Wt 88.5 kg   SpO2 98%   BMI 25.05 kg/m  Vital signs in last 24 hours: Temp:  [96.2 F (35.7 C)-98.8 F (37.1 C)] 98.8 F (37.1 C) (11/14 1300) Pulse Rate:  [64-111] 85 (11/14 0737) Resp:  [13-22] 16 (11/14 1300) BP: (70-129)/(45-93) 107/70 (11/14 1200) SpO2:  [95 %-100 %] 98 % (11/14 1100) Arterial Line BP: (78-136)/(51-72) 113/70 (11/14 1300) FiO2 (%):  [40 %] 40 % (11/14 1100) Weight:  [88.5 kg-90.7 kg] 88.5 kg (11/14 0400)   GENERAL: critically ill on ventilator, not on any sedation HENT: - Normocephalic and atraumatic, dry mm,  Eyes: Sclera edema  LUNGS - Clear to auscultation bilaterally with no wheezes CV - S1S2 RRR, no m/r/g, equal pulses bilaterally. ABDOMEN - Soft, nontender, nondistended with normoactive BS Ext: warm, well perfused, intact peripheral pulses, slight edema  NEURO:  Mental Status: attempts to open eyes to noxious stimuli  Language: unable to assess, patient intubated, does not follow commands Cranial Nerves: PERRL 2 nonreactive  EOMI, visual fields full, unable to determine facial asymmetry facial sensation intact, hearing intact, cough/gag intact,  sternocleidomastoid and trapezius muscle strength. No evidence of tongue atrophy or fibrillations Motor: no response to noxious stimuli  Tone: is normal and bulk is normal Sensation- no response to painful stimuli Coordination: unable to assess  Gait- deferred  Medications  Current Facility-Administered Medications:  .  [COMPLETED] sodium  chloride 0.9 % bolus 1,000 mL, 1,000 mL, Intravenous, Once, Stopped at 06/12/2020 1226 **AND** [COMPLETED] sodium chloride 0.9 % bolus 1,000 mL, 1,000 mL, Intravenous, Once, Stopped at 06/11/2020 1226 **AND** 0.9 %  sodium chloride infusion, , Intravenous, Continuous, Dorie Rank, MD, Last Rate: 125 mL/hr at 06/16/20 1300, Rate Verify at 06/16/20 1300 .  ceFEPIme (MAXIPIME) 2 g in sodium chloride 0.9 % 100 mL IVPB, 2 g, Intravenous, Q12H, Rumbarger, Valeda Malm, RPH, Stopped at 06/16/20 0453 .  chlorhexidine gluconate (MEDLINE KIT) (PERIDEX) 0.12 % solution 15 mL, 15 mL, Mouth Rinse, BID, Candee Furbish, MD, 15 mL at 06/16/20 0815 .  Chlorhexidine Gluconate Cloth 2 % PADS 6 each, 6 each, Topical, Daily, Candee Furbish, MD, 6 each at 06/16/20 0138 .  docusate sodium (COLACE) capsule 100 mg, 100 mg, Oral, BID PRN, Merlene Laughter F, NP .  fentaNYL (SUBLIMAZE) injection 100 mcg, 100 mcg, Intravenous, Q2H PRN, Dorie Rank, MD .  fentaNYL 2551mcg in NS 230mL (78mcg/ml) infusion-PREMIX, 0-400 mcg/hr, Intravenous, Continuous, Anders Simmonds, MD, Stopped at 06/16/20 0827 .  heparin ADULT infusion 100 units/mL (25000 units/253mL sodium chloride 0.45%), 1,250 Units/hr, Intravenous, Continuous, Lyndee Leo, RPH .  heparin bolus via infusion 4,000 Units, 4,000 Units, Intravenous, Once, Lyndee Leo, RPH .  heparin injection 1,000-6,000 Units, 1,000-6,000 Units, CRRT, PRN, Rosita Fire, MD .  heparinized saline (2000 units/L) primer fluid for CRRT, , CRRT, PRN, Rosita Fire, MD .  MEDLINE mouth rinse, 15 mL, Mouth Rinse, 10  times per day, Candee Furbish, MD, 15 mL at 06/16/20 1224 .  midazolam (VERSED) injection 2 mg, 2 mg, Intravenous, Q2H PRN, Dorie Rank, MD, 2 mg at 06/16/20 0442 .  ondansetron (ZOFRAN) injection 4 mg, 4 mg, Intravenous, Q6H PRN, Merlene Laughter F, NP .  pantoprazole (PROTONIX) injection 40 mg, 40 mg, Intravenous, QHS, Merlene Laughter F, NP, 40 mg at 06/09/2020 2101 .   phenylephrine CONCENTRATED 100mg  in sodium chloride 0.9% 259mL (0.4mg /mL) infusion, 0-400 mcg/min, Intravenous, Titrated, Candee Furbish, MD, Last Rate: 16.5 mL/hr at 06/16/20 1300, 110 mcg/min at 06/16/20 1300 .  polyethylene glycol (MIRALAX / GLYCOLAX) packet 17 g, 17 g, Per Tube, Daily PRN, Johnsie Cancel, NP .  prismasol BGK 2/2.5 dialysis solution, , CRRT, Continuous, Rosita Fire, MD, Last Rate: 2,000 mL/hr at 06/16/20 1221, New Bag at 06/16/20 1221 .  prismasol BGK 2/2.5 replacement solution, , CRRT, Continuous, Rosita Fire, MD, Last Rate: 500 mL/hr at 06/16/20 1000, New Bag at 06/16/20 1000 .  sodium bicarbonate 150 mEq in sterile water 1,000 mL infusion, , CRRT, Continuous, Rosita Fire, MD, Last Rate: 150 mL/hr at 06/16/20 0949, New Bag at 06/16/20 0949 .  sodium chloride flush (NS) 0.9 % injection 10-40 mL, 10-40 mL, Intracatheter, Q12H, Candee Furbish, MD .  sodium chloride flush (NS) 0.9 % injection 10-40 mL, 10-40 mL, Intracatheter, PRN, Candee Furbish, MD, 10 mL at 06/30/2020 2101 .  vancomycin (VANCOCIN) IVPB 1000 mg/200 mL premix, 1,000 mg, Intravenous, Q24H, Rumbarger, Valeda Malm, RPH .  vasopressin (PITRESSIN) 20 Units in sodium chloride 0.9 % 100 mL infusion-*FOR SHOCK*, 0-0.03 Units/min, Intravenous, Continuous, Merlene Laughter F, NP, Last Rate: 9 mL/hr at 06/16/20 1300, 0.03 Units/min at 06/16/20 1300 Labs CBC    Component Value Date/Time   WBC 16.8 (H) 06/16/2020 0420   RBC 5.22 06/16/2020 0420   HGB 15.6 06/16/2020 0431   HCT 46.0 06/16/2020 0431   PLT 60 (L) 06/16/2020 0420   MCV 95.6 06/16/2020 0420   MCH 31.2 06/16/2020 0420   MCHC 32.7 06/16/2020 0420   RDW 13.4 06/16/2020 0420   LYMPHSABS 0.6 (L) 06/29/2020 1120   MONOABS 1.2 (H) 06/08/2020 1120   EOSABS 0.0 06/04/2020 1120   BASOSABS 0.0 06/27/2020 1120    CMP     Component Value Date/Time   NA 157 (H) 06/16/2020 0431   K 6.2 (H) 06/16/2020 0431   CL 119 (H) 06/16/2020 0420    CO2 17 (L) 06/16/2020 0420   GLUCOSE 129 (H) 06/16/2020 0420   BUN 245 (H) 06/16/2020 0420   CREATININE 11.62 (H) 06/16/2020 0420   CREATININE 0.76 06/20/2015 1245   CALCIUM 8.3 (L) 06/16/2020 0420   PROT 6.4 (L) 06/22/2020 1120   ALBUMIN 2.7 (L) 06/16/2020 0420   AST 41 06/03/2020 1120   ALT 184 (H) 06/18/2020 1120   ALKPHOS 55 06/22/2020 1120   BILITOT 1.9 (H) 06/21/2020 1120   GFRNONAA 5 (L) 06/16/2020 0420   GFRNONAA >89 06/20/2015 1245   GFRAA >60 12/15/2018 0308   GFRAA >89 06/20/2015 1245    glycosylated hemoglobin  Lipid Panel     Component Value Date/Time   CHOL 155 01/04/2015 0914   TRIG 44 01/04/2015 0914   HDL 53 01/04/2015 0914   CHOLHDL 2.9 01/04/2015 0914   VLDL 9 01/04/2015 0914   LDLCALC 93 01/04/2015 0914     Imaging I have reviewed images in epic and the results pertinent to this consultation are:  CT-scan  of the brain Abnormal area of mixed attenuation, predominantly hypoattenuated with speckles of hyperattenuation within the right cerebellum measures 2.8 cm  MRI examination of the brain  Diffuse white matter abnormality on diffusion-weighted imaging throughout both cerebral and cerebellar hemispheres, most consistent with hypoxic ischemic injury.  No hemorrhage  Assessment:  Derek Jennings is a 34 yo male with past medical history significant for depression and drug abuse who presented to the ED after being found minimally responsive by family.  Per medical chart review patient was last seen normal one week prior to admission.  Patient was intubated in ED 2/2 low GCS.  Per EMS reports possible drug paraphernalia on scene.  Urine toxicology positive for amphetamines. Unclear how long he was unresponsive for, likely several days due to large pressure sores on back/limbs as well soiled on presentation.  MRI Brain revealed diffuse white matter abnormality on diffusion-weighted imaging throughout both cerebral and cerebellar hemispheres, most consistent with  hypoxic ischemic injury.  No hemorrhage Due to his continued metabolic derangements unable to determine neurological prognosis at this time    Recommendations: - continue to correct metabolic derangements - maintain normoglycemia, normothermia  - limit sedating medications as tolerated  - neurology will continue to follow   Beulah Gandy, DNP  [facility]  06/16/2020

## 2020-06-16 NOTE — Progress Notes (Signed)
Called [facility] due to pt not pulling volume on vent due to clamping down on ETT. Pt is very responsive to pain.

## 2020-06-16 NOTE — Progress Notes (Signed)
VASCULAR LAB    Bilateral lower extremity venous duplex has been performed.  See CV proc for preliminary results.   Kaiya Boatman, RVT 06/16/2020, 2:18 PM

## 2020-06-16 NOTE — Plan of Care (Signed)
  Problem: Clinical Measurements: Goal: Ability to maintain clinical measurements within normal limits will improve Outcome: Progressing Goal: Cardiovascular complication will be avoided Outcome: Progressing   Problem: Elimination: Goal: Will not experience complications related to urinary retention Outcome: Progressing   

## 2020-06-16 NOTE — Progress Notes (Signed)
ANTICOAGULATION CONSULT NOTE - Initial Consult  Pharmacy Consult for heparin Indication: DVT  Allergies  Allergen Reactions  . Amoxicillin Hives  . Penicillins Hives and Rash    Did it involve swelling of the face/tongue/throat, SOB, or low BP? Unknown Did it involve sudden or severe rash/hives, skin peeling, or any reaction on the inside of your mouth or nose? Unknown Did you need to seek medical attention at a hospital or doctor's office? Unknown When did it last happen? If all above answers are "NO", may proceed with cephalosporin use.     Patient Measurements: Height: 6\' 2"  (188 cm) Weight: 88.5 kg (195 lb 1.7 oz) IBW/kg (Calculated) : 82.2 Heparin Dosing Weight: 88kg  Vital Signs: Temp: 98.8 F (37.1 C) (11/14 1300) Temp Source: Oral (11/14 0630) BP: 107/70 (11/14 1200) Pulse Rate: 85 (11/14 0737)  Labs: Recent Labs    07-15-20 1120 07/15/20 1120 07-15-20 1141 07/15/2020 1427 07-15-20 1740 15-Jul-2020 1740 2020-07-15 1801 Jul 15, 2020 1801 07-15-20 2032 06/16/20 0420 06/16/20 0431  HGB 16.1   < > 16.0   < > 16.0   < > 15.6   < >  --  16.3 15.6  HCT 50.7   < > 47.0   < > 49.2   < > 46.0  --   --  49.9 46.0  PLT 66*  --   --   --  58*  58*  --   --   --   --  60*  --   APTT  --   --   --   --  33  32  --   --   --   --   --   --   LABPROT 23.4*  --   --   --  22.2*  22.3  --   --   --   --   --   --   INR 2.2*  --   --   --  2.0*  2.0  --   --   --   --   --   --   CREATININE 17.03*   < > 17.00*  --  14.50*  --   --   --   --  11.62*  --   CKTOTAL 2,353*  --   --   --   --   --   --   --   --   --   --   TROPONINIHS  --   --   --   --  7  --   --   --  6  --   --    < > = values in this interval not displayed.    Estimated Creatinine Clearance: 10.6 mL/min (A) (by C-G formula based on SCr of 11.62 mg/dL (H)).   Medical History: Past Medical History:  Diagnosis Date  . ADD (attention deficit disorder)   . Allergy   . Hyperhidrosis      Assessment: 34 year old male found down, minimally responsive by family. Originally thought to be in DIC, hgb currently stable, plt low at 60. D-dimer>20, fibrinogen low at 63. New large bilateral DVTs noted on dopplers. Orders to start IV heparin.   Goal of Therapy:  Heparin level 0.3-0.7 units/ml Monitor platelets by anticoagulation protocol: Yes   Plan:  Give 4000 units bolus x 1 Start heparin infusion at 1250 units/hr Check anti-Xa level in 6 hours and daily while on heparin Continue to monitor H&H and platelets  Sheppard Coil PharmD., BCPS Clinical Pharmacist 06/16/2020 1:31 PM

## 2020-06-17 ENCOUNTER — Inpatient Hospital Stay (HOSPITAL_COMMUNITY): Payer: Self-pay

## 2020-06-17 DIAGNOSIS — G9340 Encephalopathy, unspecified: Secondary | ICD-10-CM

## 2020-06-17 DIAGNOSIS — N179 Acute kidney failure, unspecified: Secondary | ICD-10-CM

## 2020-06-17 DIAGNOSIS — J9601 Acute respiratory failure with hypoxia: Secondary | ICD-10-CM

## 2020-06-17 DIAGNOSIS — I82409 Acute embolism and thrombosis of unspecified deep veins of unspecified lower extremity: Secondary | ICD-10-CM

## 2020-06-17 DIAGNOSIS — R0989 Other specified symptoms and signs involving the circulatory and respiratory systems: Secondary | ICD-10-CM

## 2020-06-17 LAB — HEMOGLOBIN A1C
Hgb A1c MFr Bld: 5.7 % — ABNORMAL HIGH (ref 4.8–5.6)
Mean Plasma Glucose: 116.89 mg/dL

## 2020-06-17 LAB — HEPARIN LEVEL (UNFRACTIONATED)
Heparin Unfractionated: 0.81 IU/mL — ABNORMAL HIGH (ref 0.30–0.70)
Heparin Unfractionated: 0.35 IU/mL (ref 0.30–0.70)

## 2020-06-17 LAB — CBC
HCT: 44.6 % (ref 39.0–52.0)
Hemoglobin: 14.8 g/dL (ref 13.0–17.0)
MCH: 30.7 pg (ref 26.0–34.0)
MCHC: 33.2 g/dL (ref 30.0–36.0)
MCV: 92.5 fL (ref 80.0–100.0)
Platelets: 65 10*3/uL — ABNORMAL LOW (ref 150–400)
RBC: 4.82 MIL/uL (ref 4.22–5.81)
RDW: 12.9 % (ref 11.5–15.5)
WBC: 21.5 10*3/uL — ABNORMAL HIGH (ref 4.0–10.5)
nRBC: 1.1 % — ABNORMAL HIGH (ref 0.0–0.2)

## 2020-06-17 LAB — RENAL FUNCTION PANEL
Albumin: 2.6 g/dL — ABNORMAL LOW (ref 3.5–5.0)
Albumin: 2.6 g/dL — ABNORMAL LOW (ref 3.5–5.0)
Anion gap: 16 — ABNORMAL HIGH (ref 5–15)
Anion gap: 16 — ABNORMAL HIGH (ref 5–15)
BUN: 83 mg/dL — ABNORMAL HIGH (ref 6–20)
BUN: 56 mg/dL — ABNORMAL HIGH (ref 6–20)
CO2: 21 mmol/L — ABNORMAL LOW (ref 22–32)
CO2: 19 mmol/L — ABNORMAL LOW (ref 22–32)
Calcium: 8.3 mg/dL — ABNORMAL LOW (ref 8.9–10.3)
Calcium: 8.5 mg/dL — ABNORMAL LOW (ref 8.9–10.3)
Chloride: 102 mmol/L (ref 98–111)
Chloride: 101 mmol/L (ref 98–111)
Creatinine, Ser: 4.05 mg/dL — ABNORMAL HIGH (ref 0.61–1.24)
Creatinine, Ser: 2.61 mg/dL — ABNORMAL HIGH (ref 0.61–1.24)
GFR, Estimated: 19 mL/min — ABNORMAL LOW (ref >60)
GFR, Estimated: 32 mL/min — ABNORMAL LOW (ref >60)
Glucose, Bld: 113 mg/dL — ABNORMAL HIGH (ref 70–99)
Glucose, Bld: 92 mg/dL (ref 70–99)
Phosphorus: 5.5 mg/dL — ABNORMAL HIGH (ref 2.5–4.6)
Phosphorus: 5.3 mg/dL — ABNORMAL HIGH (ref 2.5–4.6)
Potassium: 4.4 mmol/L (ref 3.5–5.1)
Potassium: 5 mmol/L (ref 3.5–5.1)
Sodium: 139 mmol/L (ref 135–145)
Sodium: 136 mmol/L (ref 135–145)

## 2020-06-17 LAB — GLUCOSE, CAPILLARY
Glucose-Capillary: 71 mg/dL (ref 70–99)
Glucose-Capillary: 75 mg/dL (ref 70–99)
Glucose-Capillary: 97 mg/dL (ref 70–99)
Glucose-Capillary: 85 mg/dL (ref 70–99)

## 2020-06-17 LAB — PROTIME-INR
INR: 1.7 — ABNORMAL HIGH (ref 0.8–1.2)
Prothrombin Time: 19.2 seconds — ABNORMAL HIGH (ref 11.4–15.2)

## 2020-06-17 LAB — LACTIC ACID, PLASMA: Lactic Acid, Venous: 2.1 mmol/L (ref 0.5–1.9)

## 2020-06-17 LAB — MAGNESIUM: Magnesium: 2.6 mg/dL — ABNORMAL HIGH (ref 1.7–2.4)

## 2020-06-17 LAB — CK: Total CK: 3896 U/L — ABNORMAL HIGH (ref 49–397)

## 2020-06-17 MED ORDER — PROSOURCE TF PO LIQD
45.0000 mL | Freq: Two times a day (BID) | ORAL | Status: DC
  Administered 2020-06-17: 45 mL
  Filled 2020-06-17: qty 45

## 2020-06-17 MED ORDER — VITAL AF 1.2 CAL PO LIQD
1000.0000 mL | ORAL | Status: DC
  Administered 2020-06-17 – 2020-06-25 (×9): 1000 mL
  Filled 2020-06-17 (×3): qty 1000

## 2020-06-17 MED ORDER — INSULIN ASPART 100 UNIT/ML ~~LOC~~ SOLN
0.0000 [IU] | SUBCUTANEOUS | Status: DC
  Administered 2020-06-18 – 2020-06-19 (×3): 1 [IU] via SUBCUTANEOUS

## 2020-06-17 MED ORDER — PANTOPRAZOLE SODIUM 40 MG PO PACK
40.0000 mg | PACK | Freq: Every day | ORAL | Status: DC
  Administered 2020-06-17 – 2020-06-25 (×9): 40 mg
  Filled 2020-06-17 (×9): qty 20

## 2020-06-17 MED ORDER — VITAL HIGH PROTEIN PO LIQD: 1000.0000 mL | ORAL | Status: DC

## 2020-06-17 MED ORDER — PROSOURCE TF PO LIQD
45.0000 mL | Freq: Three times a day (TID) | ORAL | Status: DC
  Administered 2020-06-17 – 2020-06-25 (×24): 45 mL
  Filled 2020-06-17 (×22): qty 45

## 2020-06-17 MED ORDER — PRISMASOL BGK 4/2.5 32-4-2.5 MEQ/L EC SOLN: Status: DC

## 2020-06-17 MED ORDER — SODIUM CHLORIDE 0.9 % FOR CRRT: INTRAVENOUS_CENTRAL | Status: DC | PRN

## 2020-06-17 MED ORDER — HEPARIN SODIUM (PORCINE) 1000 UNIT/ML DIALYSIS: 1000.0000 [IU] | INTRAMUSCULAR | Status: DC | PRN

## 2020-06-17 MED ORDER — LACTATED RINGERS IV BOLUS
750.0000 mL | Freq: Once | INTRAVENOUS | Status: AC
  Administered 2020-06-17: 750 mL via INTRAVENOUS

## 2020-06-17 MED ORDER — PRISMASOL BGK 4/2.5 32-4-2.5 MEQ/L REPLACEMENT SOLN: Status: DC

## 2020-06-17 MED ORDER — NOREPINEPHRINE 16 MG/250ML-% IV SOLN
0.0000 ug/min | INTRAVENOUS | Status: DC
  Administered 2020-06-17: 5 ug/min via INTRAVENOUS
  Filled 2020-06-17: qty 250

## 2020-06-17 NOTE — Progress Notes (Signed)
Initial Nutrition Assessment  DOCUMENTATION CODES:   Not applicable  INTERVENTION:   Tube feeding:  -Vital AF 1.2 @ 20 ml/hr via OG -Increase by 4 hours to goal rate of 70 ml/hr (1680) -45 ml ProSource TID  Provides: 2136 kcals, 159 grams protein, 1362 ml free water.   NUTRITION DIAGNOSIS:   Increased nutrient needs related to acute illness as evidenced by estimated needs.  GOAL:   Patient will meet greater than or equal to 90% of their needs  MONITOR:   Vent status, Skin, TF tolerance, Weight trends, Labs, I & O's  REASON FOR ASSESSMENT:        ASSESSMENT:   Patient with PMH significant for depression and drug use. Presents this admission with acute metabolic encephalopathy with concern for cerebellum infarct.   11/14- MRI shows diffuse white matter consistent with hypoxic injury, start CRRT  Pt discussed during ICU rounds and with RN.   Remains on CRRT. Requiring pressors. Unable to wean from vent. Family to discuss GOC. Xray confirms OG in stomach. Okay to start TF.   Weight shows to be stable over the last year. Suspect fluid is masking losses.   Patient is currently intubated on ventilator support MV: 14.7 L/min Temp (24hrs), Avg:99.3 F (37.4 C), Min:98.4 F (36.9 C), Max:100.2 F (37.9 C)  UOP: 40 ml x 24 hrs  OG: 400 ml x 24 hrs CRRT: 3003 ml x 24 hrs   Drips: levophed  Medications: SS novolog Labs: Phosphorus 5.5-trending down Mg 2.6 (H) Cr 4.05-down from yesterday   NUTRITION - FOCUSED PHYSICAL EXAM:    Most Recent Value  Orbital Region No depletion  Upper Arm Region Mild depletion  Thoracic and Lumbar Region Unable to assess  Buccal Region No depletion  Temple Region Mild depletion  Clavicle Bone Region Mild depletion  Clavicle and Acromion Bone Region Mild depletion  Scapular Bone Region Unable to assess  Dorsal Hand No depletion  Patellar Region No depletion  Anterior Thigh Region No depletion  Posterior Calf Region No depletion    Edema (RD Assessment) Mild  [BLE]  Hair Reviewed  Eyes Unable to assess  Mouth Unable to assess  Skin Reviewed  Nails Reviewed     Diet Order:   Diet Order            Diet NPO time specified  Diet effective now                 EDUCATION NEEDS:   Not appropriate for education at this time  Skin:  Skin Assessment: Skin Integrity Issues: Skin Integrity Issues:: DTI DTI: L foot, R ankle, L heel  Last BM:  PTA  Height:   Ht Readings from Last 1 Encounters:  07/02/2020 6\' 2"  (1.88 m)    Weight:   Wt Readings from Last 1 Encounters:  06/17/20 89.7 kg    BMI:  Body mass index is 25.39 kg/m.  Estimated Nutritional Needs:   Kcal:  2200  Protein:  130-160 g  Fluid:  >/= 2 L/day  06/19/20 RD, LDN Clinical Nutrition Pager listed in [facility]

## 2020-06-17 NOTE — Progress Notes (Signed)
[facility] ROUNDING NOTE   Subjective:   Brief history This is a 34 year old gentleman with a history of ADHD brought to the emergency room after being found down on the floor with minimal responsiveness in the GCS of 3.  He had acute kidney injury with BUN 315 creatinine 17 and potassium greater than 7.5.  The etiology of his acute kidney injury was thought to be ATN in the setting of severe dehydration hypotension and rhabdomyolysis.  His urinalysis is without protein.  His renal ultrasound showed increased echogenicity.  CRRT has been initiated.  Urine output 425 cc 06/16/2020.  Appears to be in keep even on CRRT. PrismaSol 2/2.5   replacement and dialysate Post filter sodium bicarbonate 150 cc an hour  Blood pressure 126/60 pulse 67 temperature 99 O2 sats 100% FiO2 40%  IV vasopressin, IV phenylephrine, IV heparin IV cefepime IV vancomycin  Sodium 139 potassium 4.4 chloride 102 CO2 21 BUN 83 creatinine 4 glucose 113 calcium 8.3 phosphorus 5.5 magnesium 2.6 hemoglobin 14.8   Objective:  Vital signs in last 24 hours:  Temp:  [97 F (36.1 C)-100.2 F (37.9 C)] 99.3 F (37.4 C) (11/15 0600) Pulse Rate:  [85] 85 (11/14 0737) Resp:  [13-21] 19 (11/15 0600) BP: (77-107)/(39-74) 98/60 (11/15 0400) SpO2:  [88 %-100 %] 100 % (11/15 0600) Arterial Line BP: (78-128)/(45-80) 93/53 (11/15 0600) FiO2 (%):  [40 %] 40 % (11/15 0400) Weight:  [89.7 kg] 89.7 kg (11/15 0304)  Weight change: -1.019 kg Filed Weights   07/02/2020 2100 06/16/20 0400 06/17/20 0304  Weight: 88.8 kg 88.5 kg 89.7 kg    Intake/Output: I/O last 3 completed shifts: In: 5719 [I.V.:4445.7; Other:275; IV Piggyback:998.3] Out: 2654 [Urine:720; Emesis/NG output:450; VOHYW:7371]   Intake/Output this shift:  Total I/O In: 1910.3 [I.V.:1798.6; IV Piggyback:111.7] Out: 1891 [Urine:40; Emesis/NG output:250; Other:1601]  General: Critically ill looking male with dry mucous membrane, sedated,  intubated Heart:RRR, s1s2 nl Lungs: Coarse breath sound bilateral Abdomen:soft, Non-tender, non-distended Extremities: Multiple necrotic skin changes in lower extremity Dialysis Access: Right femoral temporary HD catheter placed by ICU on 11/13.   Basic Metabolic Panel: Recent Labs  Lab 06/14/2020 1120 06/25/2020 1120 06/25/2020 1141 06/03/2020 1427 06/14/2020 1740 06/26/2020 1740 06/03/2020 1801 06/16/20 0420 06/16/20 0431 06/16/20 1405 06/17/20 0305  NA 159*   < > 155*   < > 158*   < > 157* 158* 157* 145 139  K >7.5*   < > 7.5*   < > 6.4*   < > 6.4* 6.3* 6.2* 4.9 4.4  CL 114*   < > 120*  --  114*  --   --  119*  --  108 102  CO2 16*  --   --   --  17*  --   --  17*  --  19* 21*  GLUCOSE 136*   < > 122*  --  139*  --   --  129*  --  123* 113*  BUN >300*   < > >130*  --  295*  --   --  245*  --  140* 83*  CREATININE 17.03*   < > 17.00*  --  14.50*  --   --  11.62*  --  6.28* 4.05*  CALCIUM 7.8*  --   --    < > 7.9*   < >  --  8.3*  --  7.9* 8.3*  MG  --   --   --   --  4.1*  --   --  3.6*  --   --  2.6*  PHOS  --   --   --   --  >30.0*  --   --  10.2*  --  6.4* 5.5*   < > = values in this interval not displayed.    Liver Function Tests: Recent Labs  Lab 06/06/2020 1120 06/16/20 0420 06/16/20 1405 06/17/20 0305  AST 41  --   --   --   ALT 184*  --   --   --   ALKPHOS 55  --   --   --   BILITOT 1.9*  --   --   --   PROT 6.4*  --   --   --   ALBUMIN 3.1* 2.7* 2.7* 2.6*   No results for input(s): LIPASE, AMYLASE in the last 168 hours. Recent Labs  Lab 06/24/2020 1831  AMMONIA 41*    CBC: Recent Labs  Lab 06/22/2020 1120 06/12/2020 1141 06/18/2020 1740 06/18/2020 1801 06/16/20 0420 06/16/20 0431 06/17/20 0305  WBC 14.5*  --  17.6*  --  16.8*  --  21.5*  NEUTROABS 12.5*  --   --   --   --   --   --   HGB 16.1   < > 16.0 15.6 16.3 15.6 14.8  HCT 50.7   < > 49.2 46.0 49.9 46.0 44.6  MCV 98.8  --  95.2  --  95.6  --  92.5  PLT 66*  --  58*  58*  --  60*  --  65*   < > = values  in this interval not displayed.    Cardiac Enzymes: Recent Labs  Lab 06/19/2020 1120 06/16/20 1405  CKTOTAL 2,353* 3,990*  CKMB  --  36.1*    BNP: Invalid input(s): POCBNP  CBG: Recent Labs  Lab 06/04/2020 1030  GLUCAP 127*    Microbiology: Results for orders placed or performed during the hospital encounter of 06/04/2020  Respiratory Panel by RT PCR (Flu A&B, Covid) - Nasopharyngeal Swab     Status: None   Collection Time: 06/04/2020 10:41 AM   Specimen: Nasopharyngeal Swab  Result Value Ref Range Status   SARS Coronavirus 2 by RT PCR NEGATIVE NEGATIVE Final    Comment: (NOTE) SARS-CoV-2 target nucleic acids are NOT DETECTED.  The SARS-CoV-2 RNA is generally detectable in upper respiratoy specimens during the acute phase of infection. The lowest concentration of SARS-CoV-2 viral copies this assay can detect is 131 copies/mL. A negative result does not preclude SARS-Cov-2 infection and should not be used as the sole basis for treatment or other patient management decisions. A negative result may occur with  improper specimen collection/handling, submission of specimen other than nasopharyngeal swab, presence of viral mutation(s) within the areas targeted by this assay, and inadequate number of viral copies (<131 copies/mL). A negative result must be combined with clinical observations, patient history, and epidemiological information. The expected result is Negative.  Fact Sheet for Patients:  PinkCheek.be  Fact Sheet for Healthcare Providers:  GravelBags.it  This test is no t yet approved or cleared by the [place] FDA and  has been authorized for detection and/or diagnosis of SARS-CoV-2 by FDA under an Emergency Use Authorization (EUA). This EUA will remain  in effect (meaning this test can be used) for the duration of the COVID-19 declaration under Section 564(b)(1) of the Act, 21 U.S.C. section  360bbb-3(b)(1), unless the authorization is terminated or revoked sooner.     Influenza A by PCR  NEGATIVE NEGATIVE Final   Influenza B by PCR NEGATIVE NEGATIVE Final    Comment: (NOTE) The Xpert Xpress SARS-CoV-2/FLU/RSV assay is intended as an aid in  the diagnosis of influenza from Nasopharyngeal swab specimens and  should not be used as a sole basis for treatment. Nasal washings and  aspirates are unacceptable for Xpert Xpress SARS-CoV-2/FLU/RSV  testing.  Fact Sheet for Patients: PinkCheek.be  Fact Sheet for Healthcare Providers: GravelBags.it  This test is not yet approved or cleared by the [place] FDA and  has been authorized for detection and/or diagnosis of SARS-CoV-2 by  FDA under an Emergency Use Authorization (EUA). This EUA will remain  in effect (meaning this test can be used) for the duration of the  Covid-19 declaration under Section 564(b)(1) of the Act, 21  U.S.C. section 360bbb-3(b)(1), unless the authorization is  terminated or revoked. Performed at [facility], [place] [address], [place], [place] [address]   Blood culture (routine x 2)     Status: None (Preliminary result)   Collection Time: 06/30/2020 11:20 AM   Specimen: BLOOD LEFT ARM  Result Value Ref Range Status   Specimen Description BLOOD LEFT ARM  Final   Special Requests   Final    BOTTLES DRAWN AEROBIC AND ANAEROBIC Blood Culture adequate volume   Culture   Final    NO GROWTH < 24 HOURS Performed at [facility], [place] [address], [place], [place] [address]    Report Status PENDING  Incomplete  MRSA PCR Screening     Status: None   Collection Time: 06/14/2020  5:57 PM   Specimen: Nasal Mucosa; Nasopharyngeal  Result Value Ref Range Status   MRSA by PCR NEGATIVE NEGATIVE Final    Comment:        The GeneXpert MRSA Assay (FDA approved for NASAL specimens only), is one component of a comprehensive MRSA  colonization surveillance program. It is not intended to diagnose MRSA infection nor to guide or monitor treatment for MRSA infections. Performed at [facility], [place] [address], [place], [place] [address]     Coagulation Studies: Recent Labs    06/21/2020 1120 06/11/2020 1740 06/16/20 1405 06/17/20 0305  LABPROT 23.4* 22.2*  22.3 20.6* 19.2*  INR 2.2* 2.0*  2.0 1.8* 1.7*    Urinalysis: Recent Labs    06/21/2020 1044  COLORURINE AMBER*  LABSPEC 1.020  PHURINE 5.0  GLUCOSEU NEGATIVE  HGBUR NEGATIVE  BILIRUBINUR NEGATIVE  KETONESUR NEGATIVE  PROTEINUR NEGATIVE  NITRITE NEGATIVE  LEUKOCYTESUR NEGATIVE      Imaging: CT Head Wo Contrast  Result Date: 06/06/2020 CLINICAL DATA:  Head trauma.  Mental status change. EXAM: CT HEAD WITHOUT CONTRAST TECHNIQUE: Contiguous axial images were obtained from the base of the skull through the vertex without intravenous contrast. COMPARISON:  None. FINDINGS: Brain: Abnormal area of mixed attenuation, predominantly hypoattenuated with speckles of hyperattenuation within the right cerebellum measures 2.8 cm. There is no evidence of hydrocephalus or mass effect. The ventricular system is normal. Vascular: No hyperdense vessel or unexpected calcification. Skull: Normal. Negative for fracture or focal lesion. Sinuses/Orbits: Complete opacification of the right maxillary sinus. Partial opacification of the right ethmoid sinus. Normal appearance of the orbits. Other: Right parietal scalp hematoma. IMPRESSION: 1. Abnormal area of mixed attenuation, predominantly hypoattenuated with speckles of hyperattenuation within the right cerebellum measures 2.8 cm. This may represent an acute intraparenchymal hemorrhage, a hemorrhagic infarct, or potentially a hemorrhagic mass. Further evaluation with MRI of the brain  may be considered. 2. Right parietal scalp hematoma. 3. Right maxillary and ethmoid sinusitis. Critical Value/emergent results were called by  telephone at the time of interpretation on 06/04/2020 at 3:03 pm to provider Dr. Gwenyth Ober, who verbally acknowledged these results. Electronically Signed   By: Fidela Salisbury M.D.   On: 06/12/2020 15:04   CT Cervical Spine Wo Contrast  Result Date: 06/29/2020 CLINICAL DATA:  Found unresponsive. EXAM: CT CERVICAL SPINE WITHOUT CONTRAST TECHNIQUE: Multidetector CT imaging of the cervical spine was performed without intravenous contrast. Multiplanar CT image reconstructions were also generated. COMPARISON:  None. FINDINGS: Alignment: Normal. Skull base and vertebrae: No acute fracture. No primary bone lesion or focal pathologic process. Soft tissues and spinal canal: No prevertebral fluid or swelling. No visible canal hematoma. Disc levels:  Normal. Upper chest: Negative. Other: Endotracheal tube and enteric catheter noted. IMPRESSION: No evidence of acute traumatic injury to the cervical spine. Electronically Signed   By: Fidela Salisbury M.D.   On: 06/06/2020 15:13   MR BRAIN WO CONTRAST  Result Date: 06/16/2020 CLINICAL DATA:  Suspected drug overdose.  Found unconscious. EXAM: MRI HEAD WITHOUT CONTRAST TECHNIQUE: Multiplanar, multiecho pulse sequences of the brain and surrounding structures were obtained without intravenous contrast. COMPARISON:  Head CT 06/13/2020 FINDINGS: Brain: There is diffuse white matter abnormality on diffusion-weighted imaging throughout both cerebral and cerebellar hemispheres. Edema is worst in the right cerebellar hemisphere. There is no extra-axial collection. No midline shift or other mass effect. Vascular: The major intracranial arterial and venous sinus flow voids are preserved. Skull and upper cervical spine: There is a small subgaleal hematoma at the scalp vertex. Sinuses/Orbits: Opacification of the right maxillary sinus. Normal orbits Other: None IMPRESSION: 1. Diffuse white matter abnormality on diffusion-weighted imaging throughout both cerebral and  cerebellar hemispheres, most consistent with hypoxic ischemic injury. 2. No hemorrhage or mass effect. Electronically Signed   By: Ulyses Jarred M.D.   On: 06/16/2020 00:42   US RENAL  Result Date: 06/05/2020 CLINICAL DATA:  Acute renal insufficiency EXAM: RENAL / URINARY TRACT ULTRASOUND COMPLETE COMPARISON:  None. FINDINGS: Right Kidney: Renal measurements: 10.6 x 4.9 x 5.8 cm = volume: 155.1 mL. Mild increased renal cortical echotexture. No mass or hydronephrosis. Left Kidney: Renal measurements: 10.1 x 4.9 x 5.0 cm = volume: 129.0 mL. Mild increased renal cortical echotexture. No mass or hydronephrosis. Bladder: Decompressed, Foley catheter in place. Other: None. IMPRESSION: 1. Mild bilateral increased renal cortical echotexture consistent with medical renal disease. Electronically Signed   By: Randa Ngo M.D.   On: 06/22/2020 15:34   DG Chest Portable 1 View  Result Date: 06/22/2020 CLINICAL DATA:  Endotracheal tube placement, respiratory failure EXAM: PORTABLE CHEST 1 VIEW COMPARISON:  On 07/02/2020 at 10:44 a.m. FINDINGS: Left IJ line tip: Brachiocephalic vein confluence. Endotracheal tube tip is 4.5 cm above the carina and satisfactorily positioned. A nasogastric tube enters the stomach. The patient is rotated to the right on today's radiograph, reducing diagnostic sensitivity and specificity. No pneumothorax. The lungs appear clear. No blunting of the costophrenic angles. IMPRESSION: 1. Left IJ line tip: Brachiocephalic vein confluence. 2. Endotracheal tube tip 4.5 cm above the carina and satisfactorily positioned. Electronically Signed   By: Van Clines M.D.   On: 06/14/2020 13:15   DG Chest Port 1 View  Result Date: 06/27/2020 CLINICAL DATA:  Possible overdose.  Unresponsive. EXAM: PORTABLE CHEST 1 VIEW COMPARISON:  02/09/2014 FINDINGS: Patient is rotated to the right. Low volume film with mild vascular congestion. Subtle  patchy airspace disease noted left base. Cardiopericardial  silhouette is at upper limits of normal for size. Left IJ central line tip overlies the innominate vein confluence. No evidence for pneumothorax or pleural effusion. Gaseous distention of bowel in the left abdomen likely stomach and colon. IMPRESSION: Subtle patchy airspace disease at the left base. Otherwise no acute cardiopulmonary findings. Electronically Signed   By: Misty Stanley M.D.   On: 06/14/2020 11:57   ECHOCARDIOGRAM COMPLETE  Result Date: 06/07/2020    ECHOCARDIOGRAM REPORT   Patient Name:   [facility] Date of Exam: 06/05/2020 Medical Rec #:  [id number]    Height:       74.0 in Accession #:    [id number]   Weight:       200.0 lb Date of Birth:  23-May-1986   BSA:          2.173 m Patient Age:    61 years     BP:           126/76 mmHg Patient Gender: M            HR:           91 bpm. Exam Location:  Inpatient Procedure: 2D Echo, Cardiac Doppler and Color Doppler Indications:    Dyspnea  History:        Patient has no prior history of Echocardiogram examinations.                 Risk Factors:Current Smoker. Polysubstance abuse.  Sonographer:    Clayton Lefort RDCS (AE) Referring Phys: [phone] [facility] F DAVIS  Sonographer Comments: Technically difficult study due to poor echo windows, suboptimal parasternal window, suboptimal apical window and echo performed with patient supine and on artificial respirator. Image acquisition challenging due to respiratory motion. IMPRESSIONS  1. Left ventricular ejection fraction, by estimation, is 60 to 65%. The left ventricle has normal function. The left ventricle has no regional wall motion abnormalities. There is mild left ventricular hypertrophy. Left ventricular diastolic parameters are consistent with Grade I diastolic dysfunction (impaired relaxation).  2. Right ventricular systolic function is normal. The right ventricular size is normal.  3. The mitral valve is normal in structure. No evidence of mitral valve regurgitation. No evidence of mitral stenosis.  4.  The aortic valve has an indeterminant number of cusps. Aortic valve regurgitation is not visualized. No aortic stenosis is present.  5. Aortic dilatation noted. There is moderate dilatation of the ascending aorta, measuring 45 mm.  6. The inferior vena cava is normal in size with greater than 50% respiratory variability, suggesting right atrial pressure of 3 mmHg. FINDINGS  Left Ventricle: Left ventricular ejection fraction, by estimation, is 60 to 65%. The left ventricle has normal function. The left ventricle has no regional wall motion abnormalities. The left ventricular internal cavity size was normal in size. There is  mild left ventricular hypertrophy. Left ventricular diastolic parameters are consistent with Grade I diastolic dysfunction (impaired relaxation). Right Ventricle: The right ventricular size is normal. Right ventricular systolic function is normal. Left Atrium: Left atrial size was normal in size. Right Atrium: Right atrial size was normal in size. Pericardium: There is no evidence of pericardial effusion. Mitral Valve: The mitral valve is normal in structure. No evidence of mitral valve regurgitation. No evidence of mitral valve stenosis. Tricuspid Valve: The tricuspid valve is normal in structure. Tricuspid valve regurgitation is not demonstrated. No evidence of tricuspid stenosis. Aortic Valve: The aortic valve has an indeterminant number of  cusps. Aortic valve regurgitation is not visualized. No aortic stenosis is present. Aortic valve mean gradient measures 3.0 mmHg. Aortic valve peak gradient measures 4.3 mmHg. Aortic valve area, by VTI measures 2.16 cm. Pulmonic Valve: The pulmonic valve was not well visualized. Pulmonic valve regurgitation is not visualized. No evidence of pulmonic stenosis. Aorta: Aortic dilatation noted. There is moderate dilatation of the ascending aorta, measuring 45 mm. Venous: The inferior vena cava is normal in size with greater than 50% respiratory variability,  suggesting right atrial pressure of 3 mmHg.   LEFT VENTRICLE PLAX 2D LVIDd:         3.00 cm  Diastology LVIDs:         1.80 cm  LV e' medial:    6.85 cm/s LV PW:         1.10 cm  LV E/e' medial:  8.4 LV IVS:        1.30 cm  LV e' lateral:   5.87 cm/s LVOT diam:     1.80 cm  LV E/e' lateral: 9.8 LV SV:         37 LV SV Index:   17 LVOT Area:     2.54 cm  RIGHT VENTRICLE             IVC RV S prime:     16.10 cm/s  IVC diam: 1.80 cm TAPSE (M-mode): 1.8 cm LEFT ATRIUM           Index      RIGHT ATRIUM          Index LA diam:      1.20 cm 0.55 cm/m RA Area:     9.17 cm LA Vol (A4C): 11.0 ml 5.06 ml/m RA Volume:   15.50 ml 7.13 ml/m  AORTIC VALVE AV Area (Vmax):    2.20 cm AV Area (Vmean):   2.05 cm AV Area (VTI):     2.16 cm AV Vmax:           104.00 cm/s AV Vmean:          86.800 cm/s AV VTI:            0.173 m AV Peak Grad:      4.3 mmHg AV Mean Grad:      3.0 mmHg LVOT Vmax:         90.10 cm/s LVOT Vmean:        69.900 cm/s LVOT VTI:          0.147 m LVOT/AV VTI ratio: 0.85  AORTA Ao Root diam: 3.00 cm MITRAL VALVE MV Area (PHT): 3.72 cm    SHUNTS MV Decel Time: 204 msec    Systemic VTI:  0.15 m MV E velocity: 57.40 cm/s  Systemic Diam: 1.80 cm MV A velocity: 62.60 cm/s MV E/A ratio:  0.92 Kirk Ruths MD Electronically signed by Kirk Ruths MD Signature Date/Time: 06/16/2020/4:56:42 PM    Final    VAS [place] LOWER EXTREMITY VENOUS (DVT)  Result Date: 06/16/2020  Lower Venous DVT Study Indications: Edema, and arrest.  Risk Factors: Polysubstance abuse. Limitations: Ventilation. Comparison Study: No prior study on file Performing Technologist: Sharion Dove RVS  Examination Guidelines: A complete evaluation includes B-mode imaging, spectral Doppler, color Doppler, and power Doppler as needed of all accessible portions of each vessel. Bilateral testing is considered an integral part of a complete examination. Limited examinations for reoccurring indications may be performed as noted. The reflux portion  of the exam is performed with the patient  in reverse Trendelenburg.  +---------+---------------+---------+-----------+----------+--------------+ RIGHT    CompressibilityPhasicitySpontaneityPropertiesThrombus Aging +---------+---------------+---------+-----------+----------+--------------+ CFV      Full           Yes      Yes                                 +---------+---------------+---------+-----------+----------+--------------+ SFJ      Full                                                        +---------+---------------+---------+-----------+----------+--------------+ FV Prox  Partial                                      Acute          +---------+---------------+---------+-----------+----------+--------------+ FV Mid   None                                         Acute          +---------+---------------+---------+-----------+----------+--------------+ FV DistalNone                                         Acute          +---------+---------------+---------+-----------+----------+--------------+ PFV      Full                                                        +---------+---------------+---------+-----------+----------+--------------+ POP      None           No       No                   Acute          +---------+---------------+---------+-----------+----------+--------------+ PTV      None                                         Acute          +---------+---------------+---------+-----------+----------+--------------+ PERO     None                                         Acute          +---------+---------------+---------+-----------+----------+--------------+   +---------+---------------+---------+-----------+----------+--------------+ LEFT     CompressibilityPhasicitySpontaneityPropertiesThrombus Aging +---------+---------------+---------+-----------+----------+--------------+ CFV      Full           Yes      Yes                                  +---------+---------------+---------+-----------+----------+--------------+ SFJ  Full                                                        +---------+---------------+---------+-----------+----------+--------------+ FV Prox  None                                         Acute          +---------+---------------+---------+-----------+----------+--------------+ FV Mid   None                                         Acute          +---------+---------------+---------+-----------+----------+--------------+ FV DistalNone                                         Acute          +---------+---------------+---------+-----------+----------+--------------+ PFV      None                                         Acute          +---------+---------------+---------+-----------+----------+--------------+ POP      None           No       No                   Acute          +---------+---------------+---------+-----------+----------+--------------+ PTV      None                                         Acute          +---------+---------------+---------+-----------+----------+--------------+ PERO     None                                         Acute          +---------+---------------+---------+-----------+----------+--------------+     Summary: RIGHT: - Findings consistent with acute deep vein thrombosis involving the right femoral vein, right popliteal vein, right posterior tibial veins, and right peroneal veins.  LEFT: - Findings consistent with acute deep vein thrombosis involving the left femoral vein, left proximal profunda vein, left popliteal vein, left posterior tibial veins, and left peroneal veins.  *See table(s) above for measurements and observations.    Preliminary      Medications:   . sodium chloride 125 mL/hr at 06/17/20 0600  . sodium chloride Stopped (06/17/20 0505)  . ceFEPime (MAXIPIME) IV Stopped (06/17/20 0454)  . fentaNYL  infusion INTRAVENOUS Stopped (06/16/20 0827)  . heparin 850 Units/hr (06/17/20 0600)  . phenylephrine (NEO-SYNEPHRINE) Adult infusion 70 mcg/min (06/17/20 0600)  . prismasol BGK 2/2.5 dialysis solution 2,000 mL/hr at 06/17/20 0503  . prismasol BGK 2/2.5 replacement solution 500 mL/hr at 06/17/20  0244  . sodium bicarbonate (isotonic) 1000 mL infusion 150 mL/hr at 06/17/20 0203  . vancomycin Stopped (06/16/20 1903)  . vasopressin 0.03 Units/min (06/17/20 0600)   . chlorhexidine gluconate (MEDLINE KIT)  15 mL Mouth Rinse BID  . Chlorhexidine Gluconate Cloth  6 each Topical Daily  . mouth rinse  15 mL Mouth Rinse 10 times per day  . pantoprazole (PROTONIX) IV  40 mg Intravenous QHS  . sodium chloride flush  10-40 mL Intracatheter Q12H   sodium chloride, docusate sodium, fentaNYL (SUBLIMAZE) injection, heparin, heparin, midazolam, ondansetron (ZOFRAN) IV, polyethylene glycol, sodium chloride flush  Assessment/ Plan:  1. Acute kidney injury likely ATN in the setting of severe dehydration/hypotension, rhabdomyolysis after found down on the floor. UA without proteinuria. Kidney ultrasound consistent with increased echogenicity without acute finding. Started CRRT for multiple electrolytes abnormalities including hyperkalemia.  We will change IV PrismaSol to 4/2.5 bags.  Will discontinue sodium bicarbonate    2. Severe hyperkalemia with advanced renal failure: Now managed with CRRT, prescription changed as above.  Monitor lab.  3.Unresponsive/acute metabolic encephalopathy: MRI of the brain without hemorrhage or mass-effect however has hypoxic ischemic injury.  Seen by neurology.  4.Acute respiratory failure with hypoxia: intubated. Manage byPCCM.  5.Hypernatremia: Due to dehydration.  Receiving fluid resuscitation.  6 Acidosis: We will discontinue sodium bicarbonate at this point.  Antibiotics per primary service  Prognosis guarded.  Discussed with ICU team.   LOS: [place] _0 _1 :18 AM

## 2020-06-17 NOTE — Progress Notes (Signed)
Notified [facility] regarding pt's bilateral absent pedal pulses, very faint posterior tibial pulses hear on doppler bilaterally, and calf circumference increased from 37cm to 38 cm.

## 2020-06-17 NOTE — Procedures (Signed)
Patient Name: Derek Jennings  MRN: [id number]  Epilepsy Attending: Charlsie Quest  Referring Physician/Provider: Zenia Resides, NP Date: 06/17/2020 Duration: 24.24 minutes  Patient history: 35 year old male with altered mental status.  EEG to evaluate for seizures.  Level of alertness:  comatose  AEDs during EEG study: None  Technical aspects: This EEG study was done with scalp electrodes positioned according to the 10-20 International system of electrode placement. Electrical activity was acquired at a sampling rate of 500Hz  and reviewed with a high frequency filter of 70Hz  and a low frequency filter of 1Hz . EEG data were recorded continuously and digitally stored.   Description: EEG showed continuous generalized rhythmic 2 to 3 Hz delta slowing.  Per documentation, patient was noted to be biting down onto during the EEG.  Concomitant EEG continued to show 2 to 3 Hz rhythmic delta slowing without any evolution as well as significant myogenic artifact.  Hyperventilation and photic stimulation were not performed.     Of note, EEG was technically difficult due to significant myogenic artifact.  ABNORMALITY -Continuous rhythmic slow, generalized  IMPRESSION: This technically difficult study is suggestive of severe diffuse encephalopathy, nonspecific etiology.  No seizures or epileptiform discharges were seen throughout the recording.  Of note, patient was noted to be biting down on the tube during EEG without concomitant EEG change and was not consistent with seizure.  Rakel Junio 

## 2020-06-17 NOTE — Progress Notes (Signed)
ANTICOAGULATION CONSULT NOTE   Pharmacy Consult for heparin Indication: DVT  Allergies  Allergen Reactions  . Amoxicillin Hives  . Penicillins Hives and Rash    Did it involve swelling of the face/tongue/throat, SOB, or low BP? Unknown Did it involve sudden or severe rash/hives, skin peeling, or any reaction on the inside of your mouth or nose? Unknown Did you need to seek medical attention at a hospital or doctor's office? Unknown When did it last happen? If all above answers are "NO", may proceed with cephalosporin use.     Patient Measurements: Height: 6\' 2"  (188 cm) Weight: 89.7 kg (197 lb 12 oz) IBW/kg (Calculated) : 82.2 Heparin Dosing Weight: 88kg  Vital Signs: Temp: 98.4 F (36.9 C) (11/15 1200) Temp Source: Bladder (11/15 0800) BP: 92/62 (11/15 1200) Pulse Rate: 98 (11/15 0723)  Labs: Recent Labs    07-13-2020 1120 07-13-20 1141 July 13, 2020 1740 2020-07-13 1801 Jul 13, 2020 2032 06/16/20 0420 06/16/20 0420 06/16/20 0431 06/16/20 1405 06/16/20 2015 06/17/20 0305 06/17/20 0440 06/17/20 0547 06/17/20 1140  HGB 16.1   < > 16.0   < >  --  16.3   < > 15.6  --   --  14.8  --   --   --   HCT 50.7   < > 49.2   < >  --  49.9  --  46.0  --   --  44.6  --   --   --   PLT 66*   < > 58*  58*  --   --  60*  --   --   --   --  65*  --   --   --   APTT  --   --  33  32  --   --   --   --   --   --   --   --   --   --   --   LABPROT 23.4*   < > 22.2*  22.3  --   --   --   --   --  20.6*  --  19.2*  --   --   --   INR 2.2*   < > 2.0*  2.0  --   --   --   --   --  1.8*  --  1.7*  --   --   --   HEPARINUNFRC  --   --   --   --   --   --   --   --   --  1.10*  --  0.81*  --  0.35  CREATININE 17.03*   < > 14.50*   < >  --  11.62*  --   --  6.28*  --  4.05*  --   --   --   CKTOTAL 2,353*  --   --   --   --   --   --   --  3,990*  --   --   --  3,896*  --   CKMB  --   --   --   --   --   --   --   --  36.1*  --   --   --   --   --   TROPONINIHS  --   --  7  --  6  --   --   --    --   --   --   --   --   --    < > =  values in this interval not displayed.    Estimated Creatinine Clearance: 30.4 mL/min (A) (by C-G formula based on SCr of 4.05 mg/dL (H)).   Medical History: Past Medical History:  Diagnosis Date  . ADD (attention deficit disorder)   . Allergy   . Hyperhidrosis     Assessment: 34 year old male found down, minimally responsive by family. Originally thought to be in DIC, hgb currently stable, plt low at 60. D-dimer>20, fibrinogen low at 63. New large bilateral DVTs noted on dopplers.  Pharmacy dosing heparin   Heparin level therapeutic at 0.35, INR 1.7, pltc low but stable, H/H wnl.  Goal of Therapy:  Heparin level 0.3-0.7 units/ml Monitor platelets by anticoagulation protocol: Yes   Plan:  -Continue heparin 850 units/h -Daily heparin level and CBC   Fredonia Highland, PharmD, BCPS Clinical Pharmacist [phone] Please check AMION for all [facility] Pharmacy numbers 06/17/2020

## 2020-06-17 NOTE — Progress Notes (Signed)
ANTICOAGULATION CONSULT NOTE   Pharmacy Consult for heparin Indication: DVT  Assessment: 34 year old male found down, minimally responsive by family. Originally thought to be in DIC, hgb currently stable, plt low at 60. D-dimer>20, fibrinogen low at 63. New large bilateral DVTs noted on dopplers.  Pharmacy dosing heparin  -heparin level this am remain elevated 0.81 units/ml.  Some oozing noted from HD cath  PTLC 65  Goal of Therapy:  Heparin level 0.3-0.7 units/ml Monitor platelets by anticoagulation protocol: Yes   Plan:  -Decrease heparin to 850 units/hr -Heparin level ~6 hours and daily wth CBC daily  Thanks for allowing pharmacy to be a part of this patient's care.  Talbert Cage, PharmD Clinical Pharmacist

## 2020-06-17 NOTE — Progress Notes (Signed)
eLink Physician-Brief Progress Note Patient Name: Derek Jennings DOB: [birth year] MRN: [id number]   Date of Service  06/17/2020  HPI/Events of Note  Nursing concerned that bilateral pedal pulses can no longer be palpated ar dopplered and that bilateral lower extremities have increased by 1 cm.   eICU Interventions  Will ask ground team to evaluate the patient at bedside.      Intervention Category Major Interventions: Other:  Lenell Antu 06/17/2020, 4:47 AM

## 2020-06-17 NOTE — Progress Notes (Signed)
Bilateral lower extremity arterial duplex completed. Refer to "CV Proc" under chart review to view preliminary results.  06/17/2020 11:55 AM Eula Fried., MHA, RVT, RDCS, RDMS

## 2020-06-17 NOTE — Progress Notes (Signed)
NAME:  Selby Foisy, MRN:  [id number], DOB:  [birth year], LOS: 2 ADMISSION DATE:  06/08/2020, CONSULTATION DATE: 11/13 REFERRING MD:  Dr. Tomi Bamberger, CHIEF COMPLAINT: Acute renal failure  Brief History   34 year old male presents to ED via EMS after being found minimally responsive by family.  Last seen normal 1 week prior to admission.  Known history of depression and drug abuse.  PCCM consulted for further management and admission    Past Medical History  Depression Drug abuse  Significant Hospital Events   Admitted 11/13 required intubation for airway protection, requiring multiple pressors for hemodynamic support CRRT started 11/14.  Bilateral DVTs on both lower extremities.  IV heparin started.  MRI obtained showing diffuse injury worrisome for an anoxic process  Consults:  Nephrology  Procedures:    Significant Diagnostic Tests:  Chest x-ray 11/13 > Subtle patchy airspace disease at the left base. ECHO 11/13: Left Ventricle: Left ventricular ejection fraction, by estimation, is 60  to 65%. The left ventricle has normal function. The left ventricle has no  regional wall motion abnormalities. The left ventricular internal cavity size was normal in size. There is mild left ventricular hypertrophy. Left ventricular diastolic parameters  are consistent with Grade I diastolic dysfunction (impaired relaxation). Right Ventricle: The right ventricular size is normal. Right ventricular systolic function is normal. Left Atrium: Left atrial size was normal in size.  LE vasc US 11/14:  RIGHT: - Findings consistent with acute deep vein thrombosis involving the right  femoral vein, right popliteal vein, right posterior tibial veins, and right peroneal veins.   LEFT: - Findings consistent with acute deep vein thrombosis involving the left  femoral vein, left proximal profunda vein, left popliteal vein, left posterior tibial veins, and left peroneal veins.  Micro Data:  COVID 11/13 > Blood  cultures 11/13 >  Antimicrobials:  11/13 vancomycin: Discontinued 11/15 11/13 cefepime  Interim history/subjective:  Still on pressors  Objective   Blood pressure (Abnormal) 79/53, pulse 98, temperature 99.1 F (37.3 C), resp. rate 18, height _0  (1.88 m), weight 89.7 kg, SpO2 99 %. CVP:  [0 mmHg-16 mmHg] 5 mmHg  Vent Mode: PRVC FiO2 (%):  [40 %] 40 % Set Rate:  [14 bmp] 14 bmp Vt Set:  [660 mL] 660 mL PEEP:  [5 cmH20] [place] Pressure:  [18 cmH20-22 cmH20] 22 cmH20   Intake/Output Summary (Last 24 hours) at 06/17/2020 0824 Last data filed at 06/17/2020 0800 Gross per 24 hour  Intake 4444.63 ml  Output 3629 ml  Net 815.63 ml   Filed Weights   06/14/2020 2100 06/16/20 0400 06/17/20 0304  Weight: 88.8 kg 88.5 kg 89.7 kg    Examination: General this is a 34 year old male patient who remains unresponsive on mechanical ventilation HEENT normocephalic atraumatic no jugular venous distention appreciated he is orally intubated.  Does seem to have a right downward gaze, pupils are reactive. Pulmonary: Clear to auscultation equal chest rise bilaterally pulse oximetry in the high 90s on FiO2 of 40%, and PEEP of 5 Cardiac: Regular rate and rhythm without murmur rub or gallop is normal sinus rhythm on telemetry Abdomen: Soft nontender no organomegaly Extremities: Slightly cool to palpation, capillary refill is present but sluggish.  He does have Doppler pulses present on both DPs. Neuro: Unresponsive, does have cough and gag, no purposeful movement GU: Anuric  Resolved Hospital Problem list   Hyperkalemia  Hypernatremia   Assessment & Plan:  Acute metabolic encephalopathy. multifactorial (uremia, anoxic brain injury, metabolic derangements) c/b  concern for cerebellum infarct  -Found unresponsive by family after approximately 1 week downtime; felt consequence of overdose  -Brain MRI obtained overnight 11/14 with diffuse white matter abnormality most consistent with  hypoxic/ischemic injury -Patient had prolonged downtime->neurology following Plan Cont supportive care Minimize sedation Avoid fever Correct metabolic derangements Obtain EEG  Acute hypoxemic respiratory failure due to inability to protect airway -In the setting of prolonged downtime and acute uremic encephalopathy.  Questionable overdose given polysubstance abuse history pcxr as of 11/13 w/out infiltrates. ETT and CVL in satisfactory position.  -last abg 11/14 acceptable -mental status & metabolic derrangements continue to be the primary barriers to progress Plan Cont full vent support PAD protocol  VAP bundle Daily assessment for weaning   Acute renal failure likely ATN, Rhabdo and volume depletion  -CRRT started 11/14 -acid base improved. Hyperkalemia resolved -BUN improving -total CKs peaked yesterday; trending down now  Plan Per nephrology Cont serial labs Supportive care  Circulatory shock w/ lactic acidosis Cleared from 3.1 to 2.1 from admission  Shock state etiology not clear. Suspect this reflects a mixed picture (initially hypovolemia, also consider vasoplegia 2/2 acidosis Sepsis would also be a consideration as he met SIRS criteria BUT no obvious source of infection & cultures to date neg -wbc trending down  -cultures negative  -EF 60--65%; RV size and fxn nml Plan Aim for euvolemia we will give a 750 mL bolus of lactated Ringer Changing phenylephrine to norepinephrine, I suspect we can take the vasopressin off altogether Cont to titrate pressors for MAP > 65 Day 3 vanc and cefepime. Stop vanc as if had MRSA we should know this by now If nothing else grows will stop cefepime at day 5  BLE acute DVT -was concern about dec pulses last evening -total cks trending down Plan Cont IV heparin  Await arterial duplex  Serial bedside dopplers   Thrombocytopenia, Coagulopathy -plts up slightly. INR stable. H&H holding -->on IV heparin plan Cont to trend     Best practice:  Diet: N.p.o., start tube feeds 11/15 Pain/Anxiety/Delirium protocol (if indicated): Pad protocol, RASS goal 0 effective 11/15 VAP protocol (if indicated): In place DVT prophylaxis: Therapeutic heparin started 11/14 for bilateral DVT GI prophylaxis: PPI Glucose control: SSI Mobility: Bedrest Code Status: Full code Family Communication: Will update family at bedside Disposition: ICU  My critical care time is 40 minutes  Erick Colace ACNP-BC [place] Pager # [phone] OR # [phone] if no answer

## 2020-06-17 NOTE — Progress Notes (Signed)
EEG completed, results pending. 

## 2020-06-17 NOTE — Progress Notes (Signed)
PCCM Progress Note:  Called to bedside regarding decreased pedal pulses. He remains on 2 pressors. Patient cannot participate in exam to answer questions about pain in either extremity. The appearance of his legs have not changed in the roughly 8 hours since my last evaluation. Legs are equal in size and the pulses are decreased in a symmetric fashion bilaterally. Bilateral DVTs are present. There is no history of long bone fracture, trauma or hematoma. There is a right femoral dialysis catheter placed yesterday which may be contributing to decreased blood flow to the lower extremities, along with persistent pressor requirement. There are no signs of digital ischemia.  Will obtain CK, lactic acid levels. Vascular studies ordered.  Marcelo Baldy, MD 06/17/20 5:11 AM

## 2020-06-18 ENCOUNTER — Other Ambulatory Visit: Payer: Self-pay

## 2020-06-18 ENCOUNTER — Encounter (HOSPITAL_COMMUNITY): Payer: Self-pay | Admitting: Internal Medicine

## 2020-06-18 DIAGNOSIS — G9341 Metabolic encephalopathy: Secondary | ICD-10-CM

## 2020-06-18 LAB — RENAL FUNCTION PANEL
Albumin: 2.5 g/dL — ABNORMAL LOW (ref 3.5–5.0)
Albumin: 2.3 g/dL — ABNORMAL LOW (ref 3.5–5.0)
Anion gap: 11 (ref 5–15)
Anion gap: 12 (ref 5–15)
BUN: 49 mg/dL — ABNORMAL HIGH (ref 6–20)
BUN: 48 mg/dL — ABNORMAL HIGH (ref 6–20)
CO2: 21 mmol/L — ABNORMAL LOW (ref 22–32)
CO2: 19 mmol/L — ABNORMAL LOW (ref 22–32)
Calcium: 8.7 mg/dL — ABNORMAL LOW (ref 8.9–10.3)
Calcium: 8.4 mg/dL — ABNORMAL LOW (ref 8.9–10.3)
Chloride: 103 mmol/L (ref 98–111)
Chloride: 103 mmol/L (ref 98–111)
Creatinine, Ser: 2.07 mg/dL — ABNORMAL HIGH (ref 0.61–1.24)
Creatinine, Ser: 1.73 mg/dL — ABNORMAL HIGH (ref 0.61–1.24)
GFR, Estimated: 43 mL/min — ABNORMAL LOW (ref >60)
GFR, Estimated: 53 mL/min — ABNORMAL LOW (ref >60)
Glucose, Bld: 115 mg/dL — ABNORMAL HIGH (ref 70–99)
Glucose, Bld: 135 mg/dL — ABNORMAL HIGH (ref 70–99)
Phosphorus: 4.3 mg/dL (ref 2.5–4.6)
Phosphorus: 3.9 mg/dL (ref 2.5–4.6)
Potassium: 4.9 mmol/L (ref 3.5–5.1)
Potassium: 4.8 mmol/L (ref 3.5–5.1)
Sodium: 135 mmol/L (ref 135–145)
Sodium: 134 mmol/L — ABNORMAL LOW (ref 135–145)

## 2020-06-18 LAB — HEPARIN LEVEL (UNFRACTIONATED)
Heparin Unfractionated: 0.17 IU/mL — ABNORMAL LOW (ref 0.30–0.70)
Heparin Unfractionated: 0.25 IU/mL — ABNORMAL LOW (ref 0.30–0.70)
Heparin Unfractionated: 0.39 IU/mL (ref 0.30–0.70)

## 2020-06-18 LAB — GLUCOSE, CAPILLARY
Glucose-Capillary: 104 mg/dL — ABNORMAL HIGH (ref 70–99)
Glucose-Capillary: 105 mg/dL — ABNORMAL HIGH (ref 70–99)
Glucose-Capillary: 106 mg/dL — ABNORMAL HIGH (ref 70–99)
Glucose-Capillary: 121 mg/dL — ABNORMAL HIGH (ref 70–99)
Glucose-Capillary: 140 mg/dL — ABNORMAL HIGH (ref 70–99)
Glucose-Capillary: 88 mg/dL (ref 70–99)
Glucose-Capillary: 96 mg/dL (ref 70–99)

## 2020-06-18 LAB — PROTIME-INR
INR: 1.4 — ABNORMAL HIGH (ref 0.8–1.2)
Prothrombin Time: 16.4 seconds — ABNORMAL HIGH (ref 11.4–15.2)

## 2020-06-18 LAB — CBC
HCT: 41 % (ref 39.0–52.0)
Hemoglobin: 13.9 g/dL (ref 13.0–17.0)
MCH: 31.5 pg (ref 26.0–34.0)
MCHC: 33.9 g/dL (ref 30.0–36.0)
MCV: 93 fL (ref 80.0–100.0)
Platelets: 105 10*3/uL — ABNORMAL LOW (ref 150–400)
RBC: 4.41 MIL/uL (ref 4.22–5.81)
RDW: 12.7 % (ref 11.5–15.5)
WBC: 24 10*3/uL — ABNORMAL HIGH (ref 4.0–10.5)
nRBC: 0.7 % — ABNORMAL HIGH (ref 0.0–0.2)

## 2020-06-18 LAB — MAGNESIUM: Magnesium: 2.7 mg/dL — ABNORMAL HIGH (ref 1.7–2.4)

## 2020-06-18 MED ORDER — DOCUSATE SODIUM 50 MG/5ML PO LIQD: 100.0000 mg | Freq: Two times a day (BID) | ORAL | Status: DC | PRN

## 2020-06-18 NOTE — Progress Notes (Signed)
ANTICOAGULATION CONSULT NOTE - Follow Up Consult  Pharmacy Consult for heparin Indication: DVT  Labs: Recent Labs    Jul 13, 2020 1120 07-13-2020 1141 07/13/20 1740 13-Jul-2020 1740 2020-07-13 1801 07/13/20 2032 06/16/20 0420 06/16/20 0420 06/16/20 0431 06/16/20 1405 06/16/20 2015 06/17/20 0305 06/17/20 0440 06/17/20 0547 06/17/20 1140 06/17/20 1523 06/18/20 0218  HGB 16.1   < > 16.0   < >   < >  --  16.3   < > 15.6  --   --  14.8  --   --   --   --  13.9  HCT 50.7   < > 49.2   < >   < >  --  49.9   < > 46.0  --   --  44.6  --   --   --   --  41.0  PLT 66*   < > 58*  58*   < >  --   --  60*  --   --   --   --  65*  --   --   --   --  105*  APTT  --   --  33  32  --   --   --   --   --   --   --   --   --   --   --   --   --   --   LABPROT 23.4*   < > 22.2*  22.3   < >  --   --   --   --   --  20.6*  --  19.2*  --   --   --   --  16.4*  INR 2.2*   < > 2.0*  2.0   < >  --   --   --   --   --  1.8*  --  1.7*  --   --   --   --  1.4*  HEPARINUNFRC  --   --   --   --   --   --   --   --   --   --    < >  --  0.81*  --  0.35  --  0.17*  CREATININE 17.03*   < > 14.50*   < >  --   --  11.62*  --   --  6.28*  --  4.05*  --   --   --  2.61*  --   CKTOTAL 2,353*  --   --   --   --   --   --   --   --  3,990*  --   --   --  3,896*  --   --   --   CKMB  --   --   --   --   --   --   --   --   --  36.1*  --   --   --   --   --   --   --   TROPONINIHS  --   --  7  --   --  6  --   --   --   --   --   --   --   --   --   --   --    < > = values in this interval not displayed.    Assessment: 34yo male subtherapeutic on heparin after one level  at low end of goal; no gtt issues or signs of bleeding per RN.  Plt trending back up.  Goal of Therapy:  Heparin level 0.3-0.7 units/ml   Plan:  Will increase heparin gtt by 2 units/kg/hr to 1000 units/hr and check level in 6 hours.    Vernard Gambles, PharmD, BCPS  06/18/2020,3:22 AM

## 2020-06-18 NOTE — Progress Notes (Signed)
NAME:  Derek Jennings, MRN:  [id number], DOB:  1985-12-21, LOS: 3 ADMISSION DATE:  06/22/2020, CONSULTATION DATE: 11/13 REFERRING MD:  Dr. Tomi Bamberger, CHIEF COMPLAINT: Acute renal failure  Brief History   34 year old male presents to ED via EMS after being found minimally responsive by family.  Last seen normal 1 week prior to admission.  Known history of depression and drug abuse.  PCCM consulted for further management and admission   Past Medical History  Depression Drug abuse  Significant Hospital Events   Admitted 11/13 required intubation for airway protection, requiring multiple pressors for hemodynamic support CRRT started 11/14.  Bilateral DVTs on both lower extremities.  IV heparin started.  MRI obtained showing diffuse injury worrisome for an anoxic process  Consults:  Nephrology  Procedures:    Significant Diagnostic Tests:  Chest x-ray 11/13 >> Subtle patchy airspace disease at the left base. ECHO 11/13>> Left Ventricle: Left ventricular ejection fraction, by estimation, is 60  to 65%. The left ventricle has normal function. The left ventricle has no  regional wall motion abnormalities. The left ventricular internal cavity size was normal in size. There is mild left ventricular hypertrophy. Left ventricular diastolic parameters  are consistent with Grade I diastolic dysfunction (impaired relaxation). Right Ventricle: The right ventricular size is normal. Right ventricular systolic function is normal. Left Atrium: Left atrial size was normal in size.   LE vasc US 11/14>> RIGHT: - Findings consistent with acute deep vein thrombosis involving the right  femoral vein, right popliteal vein, right posterior tibial veins, and right peroneal veins.   LEFT: - Findings consistent with acute deep vein thrombosis involving the left  femoral vein, left proximal profunda vein, left popliteal vein, left posterior tibial veins, and left peroneal veins.   11/16 Bilateral LE arterial  dopplers>>No obvious evidence of hemodynamically significant stenosis with distal dampened of flow.    Micro Data:  COVID and flu 11/13 >> negative MRSA 11/13>negative Blood cultures 11/13 >  Antimicrobials:  11/13 vancomycin: Discontinued 11/15 11/13 cefepime  Interim history/subjective:  BP improving on minimal levophed this AM  Objective   Blood pressure 94/72, pulse 77, temperature 99 F (37.2 C), resp. rate (!) 23, height $RemoveBe'6\' 2"'EmvDEvYTX$  (1.88 m), weight 90.9 kg, SpO2 96 %. CVP:  [6 mmHg-13 mmHg] 10 mmHg  Vent Mode: PRVC FiO2 (%):  [40 %] 40 % Set Rate:  [14 bmp] 14 bmp Vt Set:  [660 mL] 660 mL PEEP:  [5 cmH20] 5 cmH20 Plateau Pressure:  [15 cmH20-20 cmH20] 15 cmH20   Intake/Output Summary (Last 24 hours) at 06/18/2020 3664 Last data filed at 06/18/2020 0800 Gross per 24 hour  Intake 2744.46 ml  Output 2152 ml  Net 592.46 ml   Filed Weights   06/16/20 0400 06/17/20 0304 06/18/20 0400  Weight: 88.5 kg 89.7 kg 90.9 kg    Examination: General: 34 y/o M intubated on CRRT HEENT:  Oral mucosa moist, ETT in place, pupils dilated 67mm bilaterally, responsive to light Pulmonary: Clear to auscultation equal chest rise bilaterally pulse oximetry in the high 90s on FiO2 of 40%, and PEEP of 5 Cardiac: Regular rate and rhythm without murmur rub or gallop is normal sinus rhythm on telemetry Abdomen: Soft nontender no organomegaly Extremities: cool, pulses palpable  Neuro: Unresponsive, does have cough and gag, eyelids flickering to his name today, triggering vent GU: [place] [facility] Problem list   Hyperkalemia  Hypernatremia   Assessment & Plan:    Acute metabolic encephalopathy. multifactorial (uremia, anoxic brain  injury, metabolic derangements) c/b concern for cerebellum infarct  -Found unresponsive by family after approximately 1 week downtime; felt consequence of overdose, UDS positive for amphetamines  -Brain MRI obtained overnight 11/14 with diffuse white matter  abnormality most consistent with hypoxic/ischemic injury -Patient had prolonged downtime->neurology following P: Give full 72 hrs before neuroprognostication Cont supportive care Minimize sedation Avoid fever Correct metabolic derangements EEG suggestive of severe diffuse encephalopathy, no epileptiform discharges  Acute hypoxemic respiratory failure due to inability to protect airway -In the setting of prolonged downtime and acute uremic encephalopathy.  Questionable overdose given polysubstance abuse history pcxr as of 11/13 w/out infiltrates. ETT and CVL in satisfactory position.  -last abg 11/14 acceptable -mental status & metabolic derrangements continue to be the primary barriers to progress P: -Maintain full vent support with SAT/SBT as tolerated -titrate Vent setting to maintain SpO2 greater than or equal to 90%. -HOB elevated 30 degrees. -Plateau pressures less than 30 cm H20.  -Follow chest x-ray, ABG prn.   -Bronchial hygiene and RT/bronchodilator protocol.   Acute renal failure likely ATN, Rhabdo and volume depletion  -CRRT started 11/14,acid base improved. Hyperkalemia resolved P: Per nephrology Cont serial labs Supportive care  Circulatory shock w/ lactic acidosis Cleared from 3.1 to 2.1 from admission. Likely mixed and secondary to hypovolemia, also consider vasoplegia 2/2 acidosis Sepsis would also be a consideration as he met SIRS criteria BUT no obvious source of infection & cultures to date neg P: Down to 100mcg Levophed, can likely titrate off today, maintain MAP >65 Day 4 cefepime WBC up-trending, check CXR in AM  BLE acute DVT Concern for decreased LE pulses earlier, arterial duplex without stenosis or decreased flow P: Cont IV heparin  Serial bedside dopplers   Thrombocytopenia, Coagulopathy Platelets and INR improved today P: Continue to trend while on heparin  Best practice:  Diet: tube feeds Pain/Anxiety/Delirium protocol (if indicated): Pad  protocol, RASS goal 0 effective 11/15 VAP protocol (if indicated): In place DVT prophylaxis: Therapeutic heparin started 11/14 for bilateral DVT GI prophylaxis: PPI Glucose control: SSI Mobility: Bedrest Code Status: Full code Family Communication: Mother updated at the bedside Disposition: ICU   CRITICAL CARE Performed by: Otilio Carpen Cline Draheim   Total critical care time: 35 minutes  Critical care time was exclusive of separately billable procedures and treating other patients.  Critical care was necessary to treat or prevent imminent or life-threatening deterioration.  Critical care was time spent personally by me on the following activities: development of treatment plan with patient and/or surrogate as well as nursing, discussions with consultants, evaluation of patient's response to treatment, examination of patient, obtaining history from patient or surrogate, ordering and performing treatments and interventions, ordering and review of laboratory studies, ordering and review of radiographic studies, pulse oximetry and re-evaluation of patient's condition.   Otilio Carpen [place] Kearse, PA-C [place] [facility]  Pager# [phone], if no answer [phone]

## 2020-06-18 NOTE — Progress Notes (Signed)
ANTICOAGULATION CONSULT NOTE   Pharmacy Consult for heparin Indication: DVT  Allergies  Allergen Reactions  . Amoxicillin Hives  . Penicillins Hives and Rash    Did it involve swelling of the face/tongue/throat, SOB, or low BP? Unknown Did it involve sudden or severe rash/hives, skin peeling, or any reaction on the inside of your mouth or nose? Unknown Did you need to seek medical attention at a hospital or doctor's office? Unknown When did it last happen? If all above answers are "NO", may proceed with cephalosporin use.     Patient Measurements: Height: 6\' 2"  (188 cm) Weight: 90.9 kg (200 lb 6.4 oz) IBW/kg (Calculated) : 82.2 Heparin Dosing Weight: 88kg  Vital Signs: Temp: 98.8 F (37.1 C) (11/16 1700) Temp Source: Bladder (11/16 0800) BP: 99/58 (11/16 1700) Pulse Rate: 77 (11/16 0733)  Labs: Recent Labs    07-10-20 1801 07-10-20 2032 06/16/20 0420 06/16/20 0420 06/16/20 0431 06/16/20 1405 06/16/20 2015 06/17/20 0305 06/17/20 0440 06/17/20 0547 06/17/20 1140 06/17/20 1523 06/18/20 0218 06/18/20 0933 06/18/20 1655  HGB   < >  --  16.3   < > 15.6  --   --  14.8  --   --   --   --  13.9  --   --   HCT   < >  --  49.9   < > 46.0  --   --  44.6  --   --   --   --  41.0  --   --   PLT  --   --  60*  --   --   --   --  65*  --   --   --   --  105*  --   --   LABPROT  --   --   --   --   --  20.6*  --  19.2*  --   --   --   --  16.4*  --   --   INR  --   --   --   --   --  1.8*  --  1.7*  --   --   --   --  1.4*  --   --   HEPARINUNFRC  --   --   --   --   --   --    < >  --    < >  --    < >  --  0.17* 0.25* 0.39  CREATININE  --   --  11.62*   < >  --  6.28*  --  4.05*  --   --   --  2.61* 2.07*  --   --   CKTOTAL  --   --   --   --   --  3,990*  --   --   --  3,896*  --   --   --   --   --   CKMB  --   --   --   --   --  36.1*  --   --   --   --   --   --   --   --   --   TROPONINIHS  --  6  --   --   --   --   --   --   --   --   --   --   --   --   --     < > =  values in this interval not displayed.    Estimated Creatinine Clearance: 59.6 mL/min (A) (by C-G formula based on SCr of 2.07 mg/dL (H)).   Medical History: Past Medical History:  Diagnosis Date  . ADD (attention deficit disorder)   . Allergy   . Hyperhidrosis     Assessment: 34 year old male found down, minimally responsive by family. Originally thought to be in DIC, hgb currently stable, plt low at 60. D-dimer>20, fibrinogen low at 63. New large bilateral DVTs noted on dopplers.  Pharmacy dosing heparin   Heparin level therapeutic at 0.39 on 1150 units/hr  Goal of Therapy:  Heparin level 0.3-0.7 units/ml Monitor platelets by anticoagulation protocol: Yes   Plan:  -Continue heparin 1150 units/h -Daily heparin level and CBC  Harland German, PharmD Clinical Pharmacist **Pharmacist phone directory can now be found on [email or web address] ([email or web address]).  Listed under [facility].

## 2020-06-18 NOTE — Progress Notes (Signed)
ANTICOAGULATION CONSULT NOTE - Follow Up Consult  Pharmacy Consult for heparin Indication: DVT  Labs: Recent Labs    06/07/2020 1120 07/02/2020 1141 06/20/2020 1740 06/14/2020 1740 06/12/2020 1801 06/18/2020 2032 06/16/20 0420 06/16/20 0420 06/16/20 0431 06/16/20 1405 06/16/20 2015 06/17/20 0305 06/17/20 0440 06/17/20 0547 06/17/20 1140 06/17/20 1523 06/18/20 0218 06/18/20 0933  HGB 16.1   < > 16.0   < >   < >  --  16.3   < > 15.6  --   --  14.8  --   --   --   --  13.9  --   HCT 50.7   < > 49.2   < >   < >  --  49.9   < > 46.0  --   --  44.6  --   --   --   --  41.0  --   PLT 66*   < > 58*  58*   < >  --   --  60*  --   --   --   --  65*  --   --   --   --  105*  --   APTT  --   --  33  32  --   --   --   --   --   --   --   --   --   --   --   --   --   --   --   LABPROT 23.4*   < > 22.2*  22.3   < >  --   --   --   --   --  20.6*  --  19.2*  --   --   --   --  16.4*  --   INR 2.2*   < > 2.0*  2.0   < >  --   --   --   --   --  1.8*  --  1.7*  --   --   --   --  1.4*  --   HEPARINUNFRC  --   --   --   --   --   --   --   --   --   --    < >  --    < >  --  0.35  --  0.17* 0.25*  CREATININE 17.03*   < > 14.50*   < >  --   --  11.62*  --   --  6.28*  --  4.05*  --   --   --  2.61* 2.07*  --   CKTOTAL 2,353*  --   --   --   --   --   --   --   --  3,990*  --   --   --  3,896*  --   --   --   --   CKMB  --   --   --   --   --   --   --   --   --  36.1*  --   --   --   --   --   --   --   --   TROPONINIHS  --   --  7  --   --  6  --   --   --   --   --   --   --   --   --   --   --   --    < > =  values in this interval not displayed.    Assessment: 34yo male on heparin for BL DVT. Patient's HL 6-hours after increasing to 1000units/hr is still subtherapeutic at 0.25. No gtt issues or signs of bleeding per RN.  Hgb is 13.9 and Plts are 105. Patient had supratherapeutic HL previously on higher rates of heparin. Will monitor closely.   Goal of Therapy:  Heparin level 0.3-0.7 units/ml    Plan:  Will increase heparin gtt to 1150 units/hr and check level in 6 hours.   Monitor daily HL and CBC  Monitor for s/sx of bleed.   Isaias Sakai, PharmD, MBA Pharmacy Resident [phone] 06/18/2020 10:44 AM

## 2020-06-18 NOTE — Consult Note (Signed)
WOC Nurse Consult Note: Patient receiving care in [facility] [phone] Reason for Consult: Multiple DTPI Wound type: Irregular shaped dark colored, purple/black color. Possibly hypoperfusion related to circumstances of how patient was found. Multiple pink/purple areas on various areas of the body.  Pressure Injury POA: Yes Measurement: Right anterior LE: 11 cm x 2.5 cm Left lateral LE: 12 cm x 4.2 cm second area just above that measures 2 cm x 1.2 cm Drainage (amount, consistency, odor)  None Periwound: Intact Dressing procedure/placement/frequency:   Apply Xeroform gauze to left and right lower extremity wounds. Secure with kerlix. Change daily.  Monitor the wound area(s) for worsening of condition such as: Signs/symptoms of infection, increase in size, development of or worsening of odor, development of pain, or increased pain at the affected locations.   Notify the medical team if any of these develop.  Thank you for the consult. WOC nurse will not follow at this time.   Please re-consult the WOC team if needed.  Renaldo Reel Katrinka Blazing, MSN, RN, CMSRN, Angus Seller, [facility] Wound Treatment Associate Pager [phone]

## 2020-06-18 NOTE — Plan of Care (Signed)
  Problem: Nutrition: Goal: Adequate nutrition will be maintained Outcome: Progressing   Problem: Activity: Goal: Ability to tolerate increased activity will improve Outcome: Progressing   Problem: Education: Goal: Knowledge of General Education information will improve Description: Including pain rating scale, medication(s)/side effects and non-pharmacologic comfort measures Outcome: Not Progressing   Problem: Urinary Elimination: Goal: Progression of disease will be identified and treated Outcome: Not Progressing

## 2020-06-18 NOTE — Progress Notes (Signed)
[facility] ROUNDING NOTE   Subjective:   Brief history This is a 34 year old gentleman with a history of ADHD brought to the emergency room after being found down on the floor with minimal responsiveness in the GCS of 3.  He had acute kidney injury with BUN 315 creatinine 17 and potassium greater than 7.5.  The etiology of his acute kidney injury was thought to be ATN in the setting of severe dehydration hypotension and rhabdomyolysis.  His urinalysis is without protein.  His renal ultrasound showed increased echogenicity.  CRRT has been initiated.  Urine output 425 cc 06/16/2020.  Appears to be in keep even on CRRT. PrismaSol  4/2.5 replacement and dialysate.  Subjective Diffuse encephalopathy with no epileptiform changes noted on EEG  Blood pressure 119/58 pulse 93 temperature 98.6 O2 sats 100% FiO2 40%  IV vasopressin, IV phenylephrine, IV heparin IV cefepime IV vancomycin  Sodium 135 potassium 4.9 chloride 103 CO2 21 BUN 49 creatinine 2.07 glucose 115 calcium 8.7 phosphorus 4.3 magnesium 2.7 hemoglobin 13.9   Objective:  Vital signs in last 24 hours:  Temp:  [97.7 F (36.5 C)-99.3 F (37.4 C)] 98.6 F (37 C) (11/16 0630) Pulse Rate:  [75-98] 75 (11/16 0438) Resp:  [14-30] 17 (11/16 0630) BP: (78-124)/(46-80) 91/56 (11/16 0600) SpO2:  [95 %-100 %] 100 % (11/16 0630) Arterial Line BP: (97-138)/(48-71) 119/58 (11/16 0630) FiO2 (%):  [40 %] 40 % (11/16 0438) Weight:  [90.9 kg] 90.9 kg (11/16 0400)  Weight change: 1.2 kg Filed Weights   06/16/20 0400 06/17/20 0304 06/18/20 0400  Weight: 88.5 kg 89.7 kg 90.9 kg    Intake/Output: I/O last 3 completed shifts: In: 6518.5 [I.V.:5202; NG/GT:60; IV Piggyback:1256.4] Out: 3244 [Urine:76; Emesis/NG output:525; Other:4386]   Intake/Output this shift:  Total I/O In: 885.8 [I.V.:313; NG/GT:473; IV Piggyback:99.9] Out: 858 [Urine:25; Other:833]  General: Critically ill looking male with dry mucous membrane,  sedated, intubated Heart:RRR, s1s2 nl Lungs: Coarse breath sound bilateral Abdomen:soft, Non-tender, non-distended Extremities: Multiple necrotic skin changes in lower extremity Dialysis Access: Right femoral temporary HD catheter placed by ICU on 11/13.   Basic Metabolic Panel: Recent Labs  Lab 06/16/2020 1740 06/26/2020 1801 06/16/20 0420 06/16/20 0420 06/16/20 0431 06/16/20 1405 06/16/20 1405 06/17/20 0305 06/17/20 1523 06/18/20 0218  NA 158*   < > 158*   < > 157* 145  --  139 136 135  K 6.4*   < > 6.3*   < > 6.2* 4.9  --  4.4 5.0 4.9  CL 114*   < > 119*  --   --  108  --  102 101 103  CO2 17*   < > 17*  --   --  19*  --  21* 19* 21*  GLUCOSE 139*   < > 129*  --   --  123*  --  113* 92 115*  BUN 295*   < > 245*  --   --  140*  --  83* 56* 49*  CREATININE 14.50*   < > 11.62*  --   --  6.28*  --  4.05* 2.61* 2.07*  CALCIUM 7.9*   < > 8.3*   < >  --  7.9*   < > 8.3* 8.5* 8.7*  MG 4.1*  --  3.6*  --   --   --   --  2.6*  --  2.7*  PHOS >30.0*   < > 10.2*  --   --  6.4*  --  5.5*  5.3* 4.3   < > = values in this interval not displayed.    Liver Function Tests: Recent Labs  Lab 06/11/2020 1120 06/10/2020 1120 06/16/20 0420 06/16/20 1405 06/17/20 0305 06/17/20 1523 06/18/20 0218  AST 41  --   --   --   --   --   --   ALT 184*  --   --   --   --   --   --   ALKPHOS 55  --   --   --   --   --   --   BILITOT 1.9*  --   --   --   --   --   --   PROT 6.4*  --   --   --   --   --   --   ALBUMIN 3.1*   < > 2.7* 2.7* 2.6* 2.6* 2.5*   < > = values in this interval not displayed.   No results for input(s): LIPASE, AMYLASE in the last 168 hours. Recent Labs  Lab 06/25/2020 1831  AMMONIA 41*    CBC: Recent Labs  Lab 06/20/2020 1120 06/10/2020 1141 06/21/2020 1740 06/19/2020 1740 06/09/2020 1801 06/16/20 0420 06/16/20 0431 06/17/20 0305 06/18/20 0218  WBC 14.5*  --  17.6*  --   --  16.8*  --  21.5* 24.0*  NEUTROABS 12.5*  --   --   --   --   --   --   --   --   HGB 16.1   < > 16.0    < > 15.6 16.3 15.6 14.8 13.9  HCT 50.7   < > 49.2   < > 46.0 49.9 46.0 44.6 41.0  MCV 98.8  --  95.2  --   --  95.6  --  92.5 93.0  PLT 66*  --  58*  58*  --   --  60*  --  65* 105*   < > = values in this interval not displayed.    Cardiac Enzymes: Recent Labs  Lab 06/08/2020 1120 06/16/20 1405 06/17/20 0547  CKTOTAL 2,353* 3,990* 3,896*  CKMB  --  36.1*  --     BNP: Invalid input(s): POCBNP  CBG: Recent Labs  Lab 06/17/20 1138 06/17/20 1522 06/17/20 2014 06/18/20 0012 06/18/20 0415  GLUCAP 75 97 85 96 88    Microbiology: Results for orders placed or performed during the hospital encounter of 06/18/2020  Respiratory Panel by RT PCR (Flu A&B, Covid) - Nasopharyngeal Swab     Status: None   Collection Time: 06/11/2020 10:41 AM   Specimen: Nasopharyngeal Swab  Result Value Ref Range Status   SARS Coronavirus 2 by RT PCR NEGATIVE NEGATIVE Final    Comment: (NOTE) SARS-CoV-2 target nucleic acids are NOT DETECTED.  The SARS-CoV-2 RNA is generally detectable in upper respiratoy specimens during the acute phase of infection. The lowest concentration of SARS-CoV-2 viral copies this assay can detect is 131 copies/mL. A negative result does not preclude SARS-Cov-2 infection and should not be used as the sole basis for treatment or other patient management decisions. A negative result may occur with  improper specimen collection/handling, submission of specimen other than nasopharyngeal swab, presence of viral mutation(s) within the areas targeted by this assay, and inadequate number of viral copies (<131 copies/mL). A negative result must be combined with clinical observations, patient history, and epidemiological information. The expected result is Negative.  Fact Sheet for Patients:  PinkCheek.be  Fact Sheet for  Healthcare Providers:  GravelBags.it  This test is no t yet approved or cleared by the [place] and  has been authorized for detection and/or diagnosis of SARS-CoV-2 by FDA under an Emergency Use Authorization (EUA). This EUA will remain  in effect (meaning this test can be used) for the duration of the COVID-19 declaration under Section 564(b)(1) of the Act, 21 U.S.C. section 360bbb-3(b)(1), unless the authorization is terminated or revoked sooner.     Influenza A by PCR NEGATIVE NEGATIVE Final   Influenza B by PCR NEGATIVE NEGATIVE Final    Comment: (NOTE) The Xpert Xpress SARS-CoV-2/FLU/RSV assay is intended as an aid in  the diagnosis of influenza from Nasopharyngeal swab specimens and  should not be used as a sole basis for treatment. Nasal washings and  aspirates are unacceptable for Xpert Xpress SARS-CoV-2/FLU/RSV  testing.  Fact Sheet for Patients: PinkCheek.be  Fact Sheet for Healthcare Providers: GravelBags.it  This test is not yet approved or cleared by the [place] FDA and  has been authorized for detection and/or diagnosis of SARS-CoV-2 by  FDA under an Emergency Use Authorization (EUA). This EUA will remain  in effect (meaning this test can be used) for the duration of the  Covid-19 declaration under Section 564(b)(1) of the Act, 21  U.S.C. section 360bbb-3(b)(1), unless the authorization is  terminated or revoked. Performed at [facility], [place] [address] [place], [place] [address]   Blood culture (routine x 2)     Status: None (Preliminary result)   Collection Time: 06/07/2020 11:20 AM   Specimen: BLOOD LEFT ARM  Result Value Ref Range Status   Specimen Description BLOOD LEFT ARM  Final   Special Requests   Final    BOTTLES DRAWN AEROBIC AND ANAEROBIC Blood Culture adequate volume   Culture   Final    NO GROWTH 2 DAYS Performed at [facility] [address] [place], [place] [address]    Report Status PENDING  Incomplete  MRSA PCR Screening     Status: None   Collection  Time: 06/04/2020  5:57 PM   Specimen: Nasal Mucosa; Nasopharyngeal  Result Value Ref Range Status   MRSA by PCR NEGATIVE NEGATIVE Final    Comment:        The GeneXpert MRSA Assay (FDA approved for NASAL specimens only), is one component of a comprehensive MRSA colonization surveillance program. It is not intended to diagnose MRSA infection nor to guide or monitor treatment for MRSA infections. Performed at [facility], [place] [address] [place], [place] [address]     Coagulation Studies: Recent Labs    06/10/2020 1120 06/10/2020 1740 06/16/20 1405 06/17/20 0305 06/18/20 0218  LABPROT 23.4* 22.2*  22.3 20.6* 19.2* 16.4*  INR 2.2* 2.0*  2.0 1.8* 1.7* 1.4*    Urinalysis: Recent Labs    06/24/2020 1044  COLORURINE AMBER*  LABSPEC 1.020  PHURINE 5.0  GLUCOSEU NEGATIVE  HGBUR NEGATIVE  BILIRUBINUR NEGATIVE  KETONESUR NEGATIVE  PROTEINUR NEGATIVE  NITRITE NEGATIVE  LEUKOCYTESUR NEGATIVE      Imaging: EEG adult  Result Date: 06/17/2020 Lora Havens, MD     06/17/2020 12:11 PM Patient Name: Messiah Ahr MRN: [id number] Epilepsy Attending: Lora Havens Referring Physician/Provider: Salvadore Dom, NP Date: 06/17/2020 Duration: 24.24 minutes Patient history: 34 year old male with altered mental status.  EEG to evaluate for seizures. Level of alertness:  comatose AEDs during EEG study: None Technical aspects: This EEG study was done with scalp  electrodes positioned according to the 10-20 International system of electrode placement. Electrical activity was acquired at a sampling rate of _0  and reviewed with a high frequency filter of _1  and a low frequency filter of _2 . EEG data were recorded continuously and digitally stored. Description: EEG showed continuous generalized rhythmic 2 to 3 Hz delta slowing.  Per documentation, patient was noted to be biting down onto during the EEG.  Concomitant EEG continued to show 2 to 3 Hz rhythmic delta slowing without any  evolution as well as significant myogenic artifact.  Hyperventilation and photic stimulation were not performed.   Of note, EEG was technically difficult due to significant myogenic artifact. ABNORMALITY -Continuous rhythmic slow, generalized IMPRESSION: This technically difficult study is suggestive of severe diffuse encephalopathy, nonspecific etiology.  No seizures or epileptiform discharges were seen throughout the recording. Of note, patient was noted to be biting down on the tube during EEG without concomitant EEG change and was not consistent with seizure. Priyanka O Yadav   VAS [place] LOWER EXTREMITY ARTERIAL DUPLEX  Result Date: 06/17/2020 LOWER EXTREMITY ARTERIAL DUPLEX STUDY Indications: Poor pedal pulses bilaterally. Bilateral lower extremity DVT.  Current ABI: Not obtained due to DVT Limitations: Patient intubated; unable to cooperate. Bandaging in right groin              and bilateral ankles Comparison Study: No prior study Performing Technologist: Maudry Mayhew MHA, RDMS, RVT, RDCS  Examination Guidelines: A complete evaluation includes B-mode imaging, spectral Doppler, color Doppler, and power Doppler as needed of all accessible portions of each vessel. Bilateral testing is considered an integral part of a complete examination. Limited examinations for reoccurring indications may be performed as noted.  +-----------+--------+-----+--------+----------+--------+ RIGHT      PSV cm/sRatioStenosisWaveform  Comments +-----------+--------+-----+--------+----------+--------+ DFA        174                  triphasic          +-----------+--------+-----+--------+----------+--------+ SFA Prox   142                  monophasic         +-----------+--------+-----+--------+----------+--------+ SFA Mid    128                  triphasic          +-----------+--------+-----+--------+----------+--------+ SFA Distal 78                   triphasic           +-----------+--------+-----+--------+----------+--------+ POP Prox   71                   triphasic          +-----------+--------+-----+--------+----------+--------+ TP Trunk   55                   triphasic          +-----------+--------+-----+--------+----------+--------+ ATA Distal 23                   monophasic         +-----------+--------+-----+--------+----------+--------+ PTA Distal 18                   monophasic         +-----------+--------+-----+--------+----------+--------+ PERO Distal24                   monophasic         +-----------+--------+-----+--------+----------+--------+  +-----------+--------+-----+--------+----------+--------+  LEFT       PSV cm/sRatioStenosisWaveform  Comments +-----------+--------+-----+--------+----------+--------+ CFA Distal 100                  monophasic         +-----------+--------+-----+--------+----------+--------+ DFA        160                  triphasic          +-----------+--------+-----+--------+----------+--------+ SFA Prox   91                   monophasic         +-----------+--------+-----+--------+----------+--------+ SFA Mid    161                  monophasic         +-----------+--------+-----+--------+----------+--------+ SFA Distal 109                  monophasic         +-----------+--------+-----+--------+----------+--------+ POP Distal 53                   monophasic         +-----------+--------+-----+--------+----------+--------+ TP Trunk   67                   monophasic         +-----------+--------+-----+--------+----------+--------+ ATA Distal 11                   monophasic         +-----------+--------+-----+--------+----------+--------+ PTA Distal 23                   monophasic         +-----------+--------+-----+--------+----------+--------+ PERO Distal19                   monophasic          +-----------+--------+-----+--------+----------+--------+  Summary: Right: No obvious evidence of hemodynamically significant stenosis with distal dampened of flow. Left: No obvious evidence of hemodynamically significant stenosis with distal dampened of flow.  See table(s) above for measurements and observations. Electronically signed by Servando Snare MD on 06/17/2020 at 4:30:37 PM.    Final    VAS [place] LOWER EXTREMITY VENOUS (DVT)  Result Date: 06/17/2020  Lower Venous DVT Study Indications: Edema, and arrest.  Risk Factors: Polysubstance abuse. Limitations: Ventilation. Comparison Study: No prior study on file Performing Technologist: Sharion Dove RVS  Examination Guidelines: A complete evaluation includes B-mode imaging, spectral Doppler, color Doppler, and power Doppler as needed of all accessible portions of each vessel. Bilateral testing is considered an integral part of a complete examination. Limited examinations for reoccurring indications may be performed as noted. The reflux portion of the exam is performed with the patient in reverse Trendelenburg.  +---------+---------------+---------+-----------+----------+--------------+ RIGHT    CompressibilityPhasicitySpontaneityPropertiesThrombus Aging +---------+---------------+---------+-----------+----------+--------------+ CFV      Full           Yes      Yes                                 +---------+---------------+---------+-----------+----------+--------------+ SFJ      Full                                                        +---------+---------------+---------+-----------+----------+--------------+  FV Prox  Partial                                      Acute          +---------+---------------+---------+-----------+----------+--------------+ FV Mid   None                                         Acute          +---------+---------------+---------+-----------+----------+--------------+ FV DistalNone                                          Acute          +---------+---------------+---------+-----------+----------+--------------+ PFV      Full                                                        +---------+---------------+---------+-----------+----------+--------------+ POP      None           No       No                   Acute          +---------+---------------+---------+-----------+----------+--------------+ PTV      None                                         Acute          +---------+---------------+---------+-----------+----------+--------------+ PERO     None                                         Acute          +---------+---------------+---------+-----------+----------+--------------+   +---------+---------------+---------+-----------+----------+--------------+ LEFT     CompressibilityPhasicitySpontaneityPropertiesThrombus Aging +---------+---------------+---------+-----------+----------+--------------+ CFV      Full           Yes      Yes                                 +---------+---------------+---------+-----------+----------+--------------+ SFJ      Full                                                        +---------+---------------+---------+-----------+----------+--------------+ FV Prox  None                                         Acute          +---------+---------------+---------+-----------+----------+--------------+ FV Mid   None  Acute          +---------+---------------+---------+-----------+----------+--------------+ FV DistalNone                                         Acute          +---------+---------------+---------+-----------+----------+--------------+ PFV      None                                         Acute          +---------+---------------+---------+-----------+----------+--------------+ POP      None           No       No                   Acute           +---------+---------------+---------+-----------+----------+--------------+ PTV      None                                         Acute          +---------+---------------+---------+-----------+----------+--------------+ PERO     None                                         Acute          +---------+---------------+---------+-----------+----------+--------------+     Summary: RIGHT: - Findings consistent with acute deep vein thrombosis involving the right femoral vein, right popliteal vein, right posterior tibial veins, and right peroneal veins.  LEFT: - Findings consistent with acute deep vein thrombosis involving the left femoral vein, left proximal profunda vein, left popliteal vein, left posterior tibial veins, and left peroneal veins.  *See table(s) above for measurements and observations. Electronically signed by Servando Snare MD on 06/17/2020 at 4:34:07 PM.    Final      Medications:   .  prismasol BGK 4/2.5 500 mL/hr at 06/17/20 2033  .  prismasol BGK 4/2.5 500 mL/hr at 06/18/20 0413  . sodium chloride 20 mL/hr at 06/18/20 0600  . ceFEPime (MAXIPIME) IV Stopped (06/18/20 0408)  . feeding supplement (VITAL AF 1.2 CAL) 50 mL/hr at 06/18/20 0540  . heparin 1,000 Units/hr (06/18/20 0600)  . norepinephrine (LEVOPHED) Adult infusion 2 mcg/min (06/18/20 0600)  . prismasol BGK 4/2.5 2,000 mL/hr at 06/18/20 0610   . chlorhexidine gluconate (MEDLINE KIT)  15 mL Mouth Rinse BID  . Chlorhexidine Gluconate Cloth  6 each Topical Daily  . feeding supplement (PROSource TF)  45 mL Per Tube TID  . insulin aspart  0-9 Units Subcutaneous Q4H  . mouth rinse  15 mL Mouth Rinse 10 times per day  . pantoprazole sodium  40 mg Per Tube Q1200  . sodium chloride flush  10-40 mL Intracatheter Q12H   sodium chloride, docusate sodium, fentaNYL (SUBLIMAZE) injection, heparin, heparin, midazolam, ondansetron (ZOFRAN) IV, polyethylene glycol, sodium chloride, sodium chloride flush  Assessment/ Plan:  1.  Acute kidney injury likely ATN in the setting of severe dehydration/hypotension, rhabdomyolysis after found down on the floor. UA without proteinuria. Kidney ultrasound consistent with increased echogenicity without acute finding. Started CRRT for multiple  electrolytes abnormalities including hyperkalemia.   Patient continues on 4/2.5 dialysate and replacement fluids electrolytes appear to be well-balanced    2. Severe hyperkalemia with advanced renal failure: Now managed with CRRT, prescription changed as above.  Monitor lab.  3.Unresponsive/acute metabolic encephalopathy: MRI of the brain without hemorrhage or mass-effect however has hypoxic ischemic injury.    EEG revealing diffuse encephalopathy  4.Acute respiratory failure with hypoxia: intubated. Manage byPCCM.  5.Hypernatremia: Appears to be resolved with CRRT  6 Acidosis: Resolved with CRRT  Prognosis guarded.  Discussed with ICU team.   LOS: [place] _0 _1 :40 AM

## 2020-06-19 ENCOUNTER — Inpatient Hospital Stay (HOSPITAL_COMMUNITY): Payer: Self-pay

## 2020-06-19 DIAGNOSIS — G931 Anoxic brain damage, not elsewhere classified: Secondary | ICD-10-CM

## 2020-06-19 DIAGNOSIS — Z930 Tracheostomy status: Secondary | ICD-10-CM

## 2020-06-19 LAB — RENAL FUNCTION PANEL
Albumin: 2.1 g/dL — ABNORMAL LOW (ref 3.5–5.0)
Albumin: 1.9 g/dL — ABNORMAL LOW (ref 3.5–5.0)
Anion gap: 9 (ref 5–15)
Anion gap: 12 (ref 5–15)
BUN: 50 mg/dL — ABNORMAL HIGH (ref 6–20)
BUN: 62 mg/dL — ABNORMAL HIGH (ref 6–20)
CO2: 21 mmol/L — ABNORMAL LOW (ref 22–32)
CO2: 18 mmol/L — ABNORMAL LOW (ref 22–32)
Calcium: 8.3 mg/dL — ABNORMAL LOW (ref 8.9–10.3)
Calcium: 8.2 mg/dL — ABNORMAL LOW (ref 8.9–10.3)
Chloride: 104 mmol/L (ref 98–111)
Chloride: 103 mmol/L (ref 98–111)
Creatinine, Ser: 1.76 mg/dL — ABNORMAL HIGH (ref 0.61–1.24)
Creatinine, Ser: 2.17 mg/dL — ABNORMAL HIGH (ref 0.61–1.24)
GFR, Estimated: 52 mL/min — ABNORMAL LOW (ref >60)
GFR, Estimated: 40 mL/min — ABNORMAL LOW (ref >60)
Glucose, Bld: 103 mg/dL — ABNORMAL HIGH (ref 70–99)
Glucose, Bld: 122 mg/dL — ABNORMAL HIGH (ref 70–99)
Phosphorus: 3.2 mg/dL (ref 2.5–4.6)
Phosphorus: 3.7 mg/dL (ref 2.5–4.6)
Potassium: 4.5 mmol/L (ref 3.5–5.1)
Potassium: 4.9 mmol/L (ref 3.5–5.1)
Sodium: 134 mmol/L — ABNORMAL LOW (ref 135–145)
Sodium: 133 mmol/L — ABNORMAL LOW (ref 135–145)

## 2020-06-19 LAB — MAGNESIUM
Magnesium: 2.9 mg/dL — ABNORMAL HIGH (ref 1.7–2.4)
Magnesium: 2.6 mg/dL — ABNORMAL HIGH (ref 1.7–2.4)

## 2020-06-19 LAB — CBC
HCT: 37.8 % — ABNORMAL LOW (ref 39.0–52.0)
Hemoglobin: 12.4 g/dL — ABNORMAL LOW (ref 13.0–17.0)
MCH: 30.9 pg (ref 26.0–34.0)
MCHC: 32.8 g/dL (ref 30.0–36.0)
MCV: 94.3 fL (ref 80.0–100.0)
Platelets: 148 10*3/uL — ABNORMAL LOW (ref 150–400)
RBC: 4.01 MIL/uL — ABNORMAL LOW (ref 4.22–5.81)
RDW: 13 % (ref 11.5–15.5)
WBC: 30.3 10*3/uL — ABNORMAL HIGH (ref 4.0–10.5)
nRBC: 1.5 % — ABNORMAL HIGH (ref 0.0–0.2)

## 2020-06-19 LAB — GLUCOSE, CAPILLARY
Glucose-Capillary: 110 mg/dL — ABNORMAL HIGH (ref 70–99)
Glucose-Capillary: 104 mg/dL — ABNORMAL HIGH (ref 70–99)
Glucose-Capillary: 95 mg/dL (ref 70–99)
Glucose-Capillary: 126 mg/dL — ABNORMAL HIGH (ref 70–99)
Glucose-Capillary: 118 mg/dL — ABNORMAL HIGH (ref 70–99)

## 2020-06-19 LAB — PROTIME-INR
INR: 1.1 (ref 0.8–1.2)
Prothrombin Time: 14.2 seconds (ref 11.4–15.2)

## 2020-06-19 LAB — PROCALCITONIN: Procalcitonin: 9.01 ng/mL

## 2020-06-19 LAB — HEPARIN LEVEL (UNFRACTIONATED): Heparin Unfractionated: 0.36 IU/mL (ref 0.30–0.70)

## 2020-06-19 MED ORDER — CHLORHEXIDINE GLUCONATE CLOTH 2 % EX PADS
6.0000 | MEDICATED_PAD | Freq: Every day | CUTANEOUS | Status: DC
  Administered 2020-06-20: 6 via TOPICAL

## 2020-06-19 MED ORDER — NOREPINEPHRINE 16 MG/250ML-% IV SOLN
0.0000 ug/min | INTRAVENOUS | Status: DC
  Administered 2020-06-19: 3 ug/min via INTRAVENOUS
  Filled 2020-06-19: qty 250

## 2020-06-19 NOTE — Progress Notes (Signed)
NAMEEvangelos Jennings, MRN:  [id number], DOB:  05/07/1986, LOS: 4 ADMISSION DATE:  06/12/2020, CONSULTATION DATE: 11/13 REFERRING MD:  Dr. Tomi Bamberger, CHIEF COMPLAINT: Acute renal failure  Brief History   34 year old male presents to ED via EMS after being found minimally responsive by family.  Last seen normal 1 week prior to admission.  Known history of depression and drug abuse.  PCCM consulted for further management and admission   Past Medical History  Depression Drug abuse  Significant Hospital Events   Admitted 11/13 required intubation for airway protection, requiring multiple pressors for hemodynamic support CRRT started 11/14.  Bilateral DVTs on both lower extremities.  IV heparin started.  MRI obtained showing diffuse injury worrisome for an anoxic process  Consults:  Nephrology  Procedures:    Significant Diagnostic Tests:  Chest x-ray 11/13 >> Subtle patchy airspace disease at the left base. ECHO 11/13>> Left Ventricle: Left ventricular ejection fraction, by estimation, is 60  to 65%. The left ventricle has normal function. The left ventricle has no  regional wall motion abnormalities. The left ventricular internal cavity size was normal in size. There is mild left ventricular hypertrophy. Left ventricular diastolic parameters  are consistent with Grade I diastolic dysfunction (impaired relaxation). Right Ventricle: The right ventricular size is normal. Right ventricular systolic function is normal. Left Atrium: Left atrial size was normal in size.   LE vasc US 11/14>> RIGHT: - Findings consistent with acute deep vein thrombosis involving the right  femoral vein, right popliteal vein, right posterior tibial veins, and right peroneal veins.   LEFT: - Findings consistent with acute deep vein thrombosis involving the left  femoral vein, left proximal profunda vein, left popliteal vein, left posterior tibial veins, and left peroneal veins.   11/16 Bilateral LE arterial  dopplers>>No obvious evidence of hemodynamically significant stenosis with distal dampened of flow.    Micro Data:  COVID and flu 11/13 >> negative MRSA 11/13>negative Blood cultures 11/13 > 06/19/2020 sputum>> Antimicrobials:  11/13 vancomycin: Discontinued 11/15 11/13 cefepime  Interim history/subjective:  Off Levophed No sedation x72 hours  Objective   Blood pressure (!) 111/93, pulse 74, temperature 99 F (37.2 C), temperature source Bladder, resp. rate 17, height _0  (1.88 m), weight 89.4 kg, SpO2 98 %. CVP:  [2 mmHg-16 mmHg] 2 mmHg  Vent Mode: PRVC FiO2 (%):  [40 %] 40 % Set Rate:  [14 bmp] 14 bmp Vt Set:  [660 mL] 660 mL PEEP:  [5 cmH20] 5 cmH20 Plateau Pressure:  [10 cmH20-18 cmH20] 18 cmH20   Intake/Output Summary (Last 24 hours) at 06/19/2020 0834 Last data filed at 06/19/2020 0800 Gross per 24 hour  Intake 2685.81 ml  Output 2757 ml  Net -71.19 ml   Filed Weights   06/17/20 0304 06/18/20 0400 06/19/20 0600  Weight: 89.7 kg 90.9 kg 89.4 kg    Examination: General: 34 year old male who is unresponsive HEENT: Endotracheal tube is in place Neuro: No response to noxious stimuli.  He does overbreathing vent. CV: Heart sounds are regular PULM: Decreased throughout Vent pressure regulated volume control FIO2 40% PEEP 5 RATE 14 VT 660  GI: soft, bsx4 active  GU: Anuria Extremities: warm/dry, 2+ lower extremity edema Skin: no rashes or lesions   Resolved Hospital Problem list   Hyperkalemia  Hypernatremia   Assessment & Plan:    Acute metabolic encephalopathy. multifactorial (uremia, anoxic brain injury, metabolic derangements) c/b concern for cerebellum infarct  -Found unresponsive by family after approximately 1 week downtime; felt consequence  of overdose, UDS positive for amphetamines  -Brain MRI obtained overnight 11/14 with diffuse white matter abnormality most consistent with hypoxic/ischemic injury -Patient had prolonged downtime->neurology  following Off all sedation for 72 hours P: Neurology is following Sedation has been stopped Continue to monitor neurological status    Acute hypoxemic respiratory failure due to inability to protect airway -In the setting of prolonged downtime and acute uremic encephalopathy.   P:  Continue full ventilatory support status Will most likely need tracheostomy in the future Pulmonary toilet   -Maintain full vent support with SAT/SBT as tolerated -titrate Vent setting to maintain SpO2 greater than or equal to 90%. -HOB elevated 30 degrees. -Plateau pressures less than 30 cm H20.  -Follow chest x-ray, ABG prn.   -Bronchial hygiene and RT/bronchodilator protocol.   Acute renal failure likely ATN, Rhabdo and volume depletion  CRRT started 11/14,acid base improved. Hyperkalemia resolved P: Per nephrology   Circulatory shock w/ lactic acidosis Cleared from 3.1 to 2.1 from admission. Likely mixed and secondary to hypovolemia, also consider vasoplegia 2/2 acidosis Sepsis would also be a consideration as he met SIRS criteria BUT no obvious source of infection & cultures to date neg P: Currently off vasopressors maintain Goals mean arterial pressure greater than 65 Day 5 cefepime White count 24->30 06/19/2020 check sputum culture  BLE acute DVT Concern for decreased LE pulses earlier, arterial duplex without stenosis or decreased flow P: Continue IV heparin Continue to monitor serial Dopplers   Thrombocytopenia, Coagulopathy plts 100->148 inr 1.1 P: Monitor while on heparin for DVT  Best practice:  Diet: tube feeds Pain/Anxiety/Delirium protocol (if indicated): Pad protocol, RASS goal 0 effective 11/15 VAP protocol (if indicated): In place DVT prophylaxis: Therapeutic heparin started 11/14 for bilateral DVT GI prophylaxis: PPI Glucose control: SSI Mobility: Bedrest Code Status: Full code Family Communication: No family at bedside currently Disposition: ICU   App  cct 34 min   Richardson Landry [name] ACNP Acute Care Nurse Practitioner [place] Please consult Amion 06/19/2020, 8:34 AM

## 2020-06-19 NOTE — Progress Notes (Signed)
Subjective: Pt unable to participate due to mental status. Per RN, pt will "flutter his eyes" at times and sometimes to name calling or tactile stimulation. No purposeful or spontaneous movements to extremities. No verbal responses. In speaking with the RN, family is not wanting a trach or feeding tube, but PCCM note mentions trach. Also, per RN, pt has been off sedation for 72 hours.   Objective: Current vital signs: BP (!) 90/59    Pulse 74    Temp 99.3 F (37.4 C)    Resp (!) 21    Ht _0  (1.88 m)    Wt 89.4 kg    SpO2 98%    BMI 25.31 kg/m  Vital signs in last 24 hours: Temp:  [98.6 F (37 C)-99.3 F (37.4 C)] 99.3 F (37.4 C) (11/17 0600) Pulse Rate:  [74-93] 74 (11/17 0351) Resp:  [12-40] 21 (11/17 0600) BP: (85-131)/(51-80) 90/59 (11/17 0600) SpO2:  [93 %-100 %] 98 % (11/17 0724) Arterial Line BP: (96-152)/(41-82) 122/58 (11/17 0600) FiO2 (%):  [40 %] 40 % (11/17 0724) Weight:  [89.4 kg] 89.4 kg (11/17 0600)  Intake/Output from previous day: 11/16 0701 - 11/17 0700 In: 2474.6 [I.V.:521.2; NG/GT:1753.3; IV Piggyback:200.1] Out: 2764 [Urine:46] Intake/Output this shift: No intake/output data recorded. Nutritional status:  Diet Order            Diet NPO time specified  Diet effective now                 Neurologic Exam: Mentation: Pt is unresponsive.  CN: II-pupils 57m and reactive to light III, IV, VI-unable to assess due to unresponsiveness V-unable to assess VII- no facial asymetry noted.  VIII-Cannot assess IX, X Intubated XI-unable to assess XII-unable to assess.  Motor: No ipurposeful or spontaneous movements of extremities noted. Cannot assess strength due to no response from pt.  Reflexes: non-detectable.   Lab Results: Results for orders placed or performed during the hospital encounter of 06/30/2020 (from the past 48 hour(s))  Glucose, capillary     Status: None   Collection Time: 06/17/20 11:38 AM  Result Value Ref Range   Glucose-Capillary 75 70  - 99 mg/dL    Comment: Glucose reference range applies only to samples taken after fasting for at least 8 hours.  Heparin level (unfractionated)     Status: None   Collection Time: 06/17/20 11:40 AM  Result Value Ref Range   Heparin Unfractionated 0.35 0.30 - 0.70 IU/mL    Comment: (NOTE) If heparin results are below expected values, and patient dosage has  been confirmed, suggest follow up testing of antithrombin III levels. Performed at [facility] [address] [place] [address]  Glucose, capillary     Status: None   Collection Time: 06/17/20  3:22 PM  Result Value Ref Range   Glucose-Capillary 97 70 - 99 mg/dL    Comment: Glucose reference range applies only to samples taken after fasting for at least 8 hours.  Renal function panel (daily at 1600)     Status: Abnormal   Collection Time: 06/17/20  3:23 PM  Result Value Ref Range   Sodium 136 135 - 145 mmol/L   Potassium 5.0 3.5 - 5.1 mmol/L   Chloride 101 98 - 111 mmol/L   CO2 19 (L) 22 - 32 mmol/L   Glucose, Bld 92 70 - 99 mg/dL    Comment: Glucose reference range applies only to samples taken after fasting for at least 8 hours.  BUN 56 (H) 6 - 20 mg/dL   Creatinine, Ser 2.61 (H) 0.61 - 1.24 mg/dL   Calcium 8.5 (L) 8.9 - 10.3 mg/dL   Phosphorus 5.3 (H) 2.5 - 4.6 mg/dL   Albumin 2.6 (L) 3.5 - 5.0 g/dL   GFR, Estimated 32 (L) >60 mL/min    Comment: (NOTE) Calculated using the CKD-EPI Creatinine Equation (2021)    Anion gap 16 (H) 5 - 15    Comment: Performed at [facility] [address] [place], [place] [address]  Glucose, capillary     Status: None   Collection Time: 06/17/20  8:14 PM  Result Value Ref Range   Glucose-Capillary 85 70 - 99 mg/dL    Comment: Glucose reference range applies only to samples taken after fasting for at least 8 hours.  Glucose, capillary     Status: None   Collection Time: 06/18/20 12:12 AM  Result Value Ref Range   Glucose-Capillary 96 70 - 99 mg/dL     Comment: Glucose reference range applies only to samples taken after fasting for at least 8 hours.  Magnesium     Status: Abnormal   Collection Time: 06/18/20  2:18 AM  Result Value Ref Range   Magnesium 2.7 (H) 1.7 - 2.4 mg/dL    Comment: Performed at [facility] [address] [place], [place] [address]  Protime-INR     Status: Abnormal   Collection Time: 06/18/20  2:18 AM  Result Value Ref Range   Prothrombin Time 16.4 (H) 11.4 - 15.2 seconds   INR 1.4 (H) 0.8 - 1.2    Comment: (NOTE) INR goal varies based on device and disease states. Performed at [facility], [place] [address] [place], [place] [address]   CBC     Status: Abnormal   Collection Time: 06/18/20  2:18 AM  Result Value Ref Range   WBC 24.0 (H) 4.0 - 10.5 K/uL   RBC 4.41 4.22 - 5.81 MIL/uL   Hemoglobin 13.9 13.0 - 17.0 g/dL   HCT 41.0 39 - 52 %   MCV 93.0 80.0 - 100.0 fL   MCH 31.5 26.0 - 34.0 pg   MCHC 33.9 30.0 - 36.0 g/dL   RDW 12.7 11.5 - 15.5 %   Platelets 105 (L) 150 - 400 K/uL    Comment: REPEATED TO VERIFY SPECIMEN CHECKED FOR CLOTS Immature Platelet Fraction may be clinically indicated, consider ordering this additional test [id number] CONSISTENT WITH PREVIOUS RESULT    nRBC 0.7 (H) 0.0 - 0.2 %    Comment: Performed at [facility], [place] [address] [place], [place] [address]  Heparin level (unfractionated)     Status: Abnormal   Collection Time: 06/18/20  2:18 AM  Result Value Ref Range   Heparin Unfractionated 0.17 (L) 0.30 - 0.70 IU/mL    Comment: (NOTE) If heparin results are below expected values, and patient dosage has  been confirmed, suggest follow up testing of antithrombin III levels. Performed at [facility], [place] [address], [place], [place] [address]   Renal function panel     Status: Abnormal   Collection Time: 06/18/20  2:18 AM  Result Value Ref Range   Sodium 135 135 - 145 mmol/L   Potassium 4.9 3.5 - 5.1 mmol/L   Chloride 103 98 - 111 mmol/L   CO2 21  (L) 22 - 32 mmol/L   Glucose, Bld 115 (H) 70 - 99 mg/dL    Comment: Glucose reference  range applies only to samples taken after fasting for at least 8 hours.   BUN 49 (H) 6 - 20 mg/dL   Creatinine, Ser 2.07 (H) 0.61 - 1.24 mg/dL   Calcium 8.7 (L) 8.9 - 10.3 mg/dL   Phosphorus 4.3 2.5 - 4.6 mg/dL   Albumin 2.5 (L) 3.5 - 5.0 g/dL   GFR, Estimated 43 (L) >60 mL/min    Comment: (NOTE) Calculated using the CKD-EPI Creatinine Equation (2021)    Anion gap 11 5 - 15    Comment: Performed at [facility] [address], [place], [place] [address]  Glucose, capillary     Status: None   Collection Time: 06/18/20  4:15 AM  Result Value Ref Range   Glucose-Capillary 88 70 - 99 mg/dL    Comment: Glucose reference range applies only to samples taken after fasting for at least 8 hours.  Glucose, capillary     Status: Abnormal   Collection Time: 06/18/20  7:42 AM  Result Value Ref Range   Glucose-Capillary 121 (H) 70 - 99 mg/dL    Comment: Glucose reference range applies only to samples taken after fasting for at least 8 hours.  Heparin level (unfractionated)     Status: Abnormal   Collection Time: 06/18/20  9:33 AM  Result Value Ref Range   Heparin Unfractionated 0.25 (L) 0.30 - 0.70 IU/mL    Comment: (NOTE) If heparin results are below expected values, and patient dosage has  been confirmed, suggest follow up testing of antithrombin III levels. Performed at [facility], [place] [address], [place], [place] [address]   Glucose, capillary     Status: Abnormal   Collection Time: 06/18/20 11:27 AM  Result Value Ref Range   Glucose-Capillary 106 (H) 70 - 99 mg/dL    Comment: Glucose reference range applies only to samples taken after fasting for at least 8 hours.  Glucose, capillary     Status: Abnormal   Collection Time: 06/18/20  3:18 PM  Result Value Ref Range   Glucose-Capillary 140 (H) 70 - 99 mg/dL    Comment: Glucose reference range applies only to samples taken after fasting  for at least 8 hours.  Renal function panel (daily at 1600)     Status: Abnormal   Collection Time: 06/18/20  4:55 PM  Result Value Ref Range   Sodium 134 (L) 135 - 145 mmol/L   Potassium 4.8 3.5 - 5.1 mmol/L   Chloride 103 98 - 111 mmol/L   CO2 19 (L) 22 - 32 mmol/L   Glucose, Bld 135 (H) 70 - 99 mg/dL    Comment: Glucose reference range applies only to samples taken after fasting for at least 8 hours.   BUN 48 (H) 6 - 20 mg/dL   Creatinine, Ser 1.73 (H) 0.61 - 1.24 mg/dL   Calcium 8.4 (L) 8.9 - 10.3 mg/dL   Phosphorus 3.9 2.5 - 4.6 mg/dL   Albumin 2.3 (L) 3.5 - 5.0 g/dL   GFR, Estimated 53 (L) >60 mL/min    Comment: (NOTE) Calculated using the CKD-EPI Creatinine Equation (2021)    Anion gap 12 5 - 15    Comment: Performed at [facility] [address], [place], [place] [address]  Magnesium     Status: Abnormal   Collection Time: 06/18/20  4:55 PM  Result Value Ref Range   Magnesium 2.9 (H) 1.7 - 2.4 mg/dL    Comment: Performed at [facility]  [address] [place], [place] [address]  Heparin level (unfractionated)     Status: None   Collection Time: 06/18/20  4:55 PM  Result Value Ref Range   Heparin Unfractionated 0.39 0.30 - 0.70 IU/mL    Comment: (NOTE) If heparin results are below expected values, and patient dosage has  been confirmed, suggest follow up testing of antithrombin III levels. Performed at [facility], [place] [address], [place], [place] [address]   Glucose, capillary     Status: Abnormal   Collection Time: 06/18/20  8:45 PM  Result Value Ref Range   Glucose-Capillary 104 (H) 70 - 99 mg/dL    Comment: Glucose reference range applies only to samples taken after fasting for at least 8 hours.  Glucose, capillary     Status: Abnormal   Collection Time: 06/18/20 11:54 PM  Result Value Ref Range   Glucose-Capillary 105 (H) 70 - 99 mg/dL    Comment: Glucose reference range applies only to samples taken after fasting for at least 8 hours.   Glucose, capillary     Status: Abnormal   Collection Time: 06/19/20  4:28 AM  Result Value Ref Range   Glucose-Capillary 110 (H) 70 - 99 mg/dL    Comment: Glucose reference range applies only to samples taken after fasting for at least 8 hours.  Magnesium     Status: Abnormal   Collection Time: 06/19/20  4:30 AM  Result Value Ref Range   Magnesium 2.6 (H) 1.7 - 2.4 mg/dL    Comment: Performed at [facility] [address] [place], [place] [address]  Protime-INR     Status: None   Collection Time: 06/19/20  4:30 AM  Result Value Ref Range   Prothrombin Time 14.2 11.4 - 15.2 seconds   INR 1.1 0.8 - 1.2    Comment: (NOTE) INR goal varies based on device and disease states. Performed at [facility], [place] [address], [place], [place] [address]   CBC     Status: Abnormal   Collection Time: 06/19/20  4:30 AM  Result Value Ref Range   WBC 30.3 (H) 4.0 - 10.5 K/uL   RBC 4.01 (L) 4.22 - 5.81 MIL/uL   Hemoglobin 12.4 (L) 13.0 - 17.0 g/dL   HCT 37.8 (L) 39 - 52 %   MCV 94.3 80.0 - 100.0 fL   MCH 30.9 26.0 - 34.0 pg   MCHC 32.8 30.0 - 36.0 g/dL   RDW 13.0 11.5 - 15.5 %   Platelets 148 (L) 150 - 400 K/uL   nRBC 1.5 (H) 0.0 - 0.2 %    Comment: Performed at [facility] [address] [place], [place] [address]  Renal function panel     Status: Abnormal   Collection Time: 06/19/20  4:30 AM  Result Value Ref Range   Sodium 134 (L) 135 - 145 mmol/L   Potassium 4.5 3.5 - 5.1 mmol/L   Chloride 104 98 - 111 mmol/L   CO2 21 (L) 22 - 32 mmol/L   Glucose, Bld 103 (H) 70 - 99 mg/dL    Comment: Glucose reference range applies only to samples taken after fasting for at least 8 hours.   BUN 50 (H) 6 - 20 mg/dL   Creatinine, Ser 1.76 (H) 0.61 - 1.24 mg/dL   Calcium 8.3 (L) 8.9 - 10.3 mg/dL   Phosphorus 3.2 2.5 - 4.6 mg/dL   Albumin 2.1 (L) 3.5 - 5.0 g/dL   GFR, Estimated 52 (L) >  60 mL/min    Comment: (NOTE) Calculated using the CKD-EPI Creatinine Equation (2021)     Anion gap 9 5 - 15    Comment: Performed at [facility], [place] [address] [place], [place] [address]  Heparin level (unfractionated)     Status: None   Collection Time: 06/19/20  4:30 AM  Result Value Ref Range   Heparin Unfractionated 0.36 0.30 - 0.70 IU/mL    Comment: (NOTE) If heparin results are below expected values, and patient dosage has  been confirmed, suggest follow up testing of antithrombin III levels. Performed at [facility], [place] [address] [place], [place] [address]     Recent Results (from the past 240 hour(s))  Respiratory Panel by RT PCR (Flu A&B, Covid) - Nasopharyngeal Swab     Status: None   Collection Time: 06/13/2020 10:41 AM   Specimen: Nasopharyngeal Swab  Result Value Ref Range Status   SARS Coronavirus 2 by RT PCR NEGATIVE NEGATIVE Final    Comment: (NOTE) SARS-CoV-2 target nucleic acids are NOT DETECTED.  The SARS-CoV-2 RNA is generally detectable in upper respiratoy specimens during the acute phase of infection. The lowest concentration of SARS-CoV-2 viral copies this assay can detect is 131 copies/mL. A negative result does not preclude SARS-Cov-2 infection and should not be used as the sole basis for treatment or other patient management decisions. A negative result may occur with  improper specimen collection/handling, submission of specimen other than nasopharyngeal swab, presence of viral mutation(s) within the areas targeted by this assay, and inadequate number of viral copies (<131 copies/mL). A negative result must be combined with clinical observations, patient history, and epidemiological information. The expected result is Negative.  Fact Sheet for Patients:  PinkCheek.be  Fact Sheet for Healthcare Providers:  GravelBags.it  This test is no t yet approved or cleared by the [place] FDA and  has been authorized for detection and/or diagnosis of SARS-CoV-2 by FDA  under an Emergency Use Authorization (EUA). This EUA will remain  in effect (meaning this test can be used) for the duration of the COVID-19 declaration under Section 564(b)(1) of the Act, 21 U.S.C. section 360bbb-3(b)(1), unless the authorization is terminated or revoked sooner.     Influenza A by PCR NEGATIVE NEGATIVE Final   Influenza B by PCR NEGATIVE NEGATIVE Final    Comment: (NOTE) The Xpert Xpress SARS-CoV-2/FLU/RSV assay is intended as an aid in  the diagnosis of influenza from Nasopharyngeal swab specimens and  should not be used as a sole basis for treatment. Nasal washings and  aspirates are unacceptable for Xpert Xpress SARS-CoV-2/FLU/RSV  testing.  Fact Sheet for Patients: PinkCheek.be  Fact Sheet for Healthcare Providers: GravelBags.it  This test is not yet approved or cleared by the [place] FDA and  has been authorized for detection and/or diagnosis of SARS-CoV-2 by  FDA under an Emergency Use Authorization (EUA). This EUA will remain  in effect (meaning this test can be used) for the duration of the  Covid-19 declaration under Section 564(b)(1) of the Act, 21  U.S.C. section 360bbb-3(b)(1), unless the authorization is  terminated or revoked. Performed at [facility], [place] [address], [place], [place] [address]   Blood culture (routine x 2)     Status: None (Preliminary result)   Collection Time: 06/06/2020 11:20 AM   Specimen: BLOOD LEFT ARM  Result Value Ref Range Status   Specimen Description BLOOD LEFT ARM  Final   Special Requests  Final    BOTTLES DRAWN AEROBIC AND ANAEROBIC Blood Culture adequate volume   Culture   Final    NO GROWTH 3 DAYS Performed at [facility], [place] [address], [place], [place] [address]    Report Status PENDING  Incomplete  MRSA PCR Screening     Status: None   Collection Time: 07/01/2020  5:57 PM   Specimen: Nasal Mucosa; Nasopharyngeal  Result Value  Ref Range Status   MRSA by PCR NEGATIVE NEGATIVE Final    Comment:        The GeneXpert MRSA Assay (FDA approved for NASAL specimens only), is one component of a comprehensive MRSA colonization surveillance program. It is not intended to diagnose MRSA infection nor to guide or monitor treatment for MRSA infections. Performed at [facility], [place] [address] [place], [place] [address]     Lipid Panel No results for input(s): CHOL, TRIG, HDL, CHOLHDL, VLDL, LDLCALC in the last 72 hours.  Studies/Results: EEG adult  Result Date: 06/17/2020 Lora Havens, MD     06/17/2020 12:11 PM Patient Name: Derek Jennings MRN: [id number] Epilepsy Attending: Lora Havens Referring Physician/Provider: Salvadore Dom, NP Date: 06/17/2020 Duration: 24.24 minutes Patient history: 34 year old male with altered mental status.  EEG to evaluate for seizures. Level of alertness:  comatose AEDs during EEG study: None Technical aspects: This EEG study was done with scalp electrodes positioned according to the 10-20 International system of electrode placement. Electrical activity was acquired at a sampling rate of _0  and reviewed with a high frequency filter of _1  and a low frequency filter of _2 . EEG data were recorded continuously and digitally stored. Description: EEG showed continuous generalized rhythmic 2 to 3 Hz delta slowing.  Per documentation, patient was noted to be biting down onto during the EEG.  Concomitant EEG continued to show 2 to 3 Hz rhythmic delta slowing without any evolution as well as significant myogenic artifact.  Hyperventilation and photic stimulation were not performed.   Of note, EEG was technically difficult due to significant myogenic artifact. ABNORMALITY -Continuous rhythmic slow, generalized IMPRESSION: This technically difficult study is suggestive of severe diffuse encephalopathy, nonspecific etiology.  No seizures or epileptiform discharges were seen throughout the  recording. Of note, patient was noted to be biting down on the tube during EEG without concomitant EEG change and was not consistent with seizure. Priyanka O Yadav   VAS [place] LOWER EXTREMITY ARTERIAL DUPLEX  Result Date: 06/17/2020 LOWER EXTREMITY ARTERIAL DUPLEX STUDY Indications: Poor pedal pulses bilaterally. Bilateral lower extremity DVT.  Current ABI: Not obtained due to DVT Limitations: Patient intubated; unable to cooperate. Bandaging in right groin              and bilateral ankles Comparison Study: No prior study Performing Technologist: Maudry Mayhew MHA, RDMS, RVT, RDCS  Examination Guidelines: A complete evaluation includes B-mode imaging, spectral Doppler, color Doppler, and power Doppler as needed of all accessible portions of each vessel. Bilateral testing is considered an integral part of a complete examination. Limited examinations for reoccurring indications may be performed as noted.  +-----------+--------+-----+--------+----------+--------+  RIGHT       PSV cm/s Ratio Stenosis Waveform   Comments  +-----------+--------+-----+--------+----------+--------+  DFA         174                     triphasic            +-----------+--------+-----+--------+----------+--------+  SFA Prox  142                     monophasic           +-----------+--------+-----+--------+----------+--------+  SFA Mid     128                     triphasic            +-----------+--------+-----+--------+----------+--------+  SFA Distal  78                      triphasic            +-----------+--------+-----+--------+----------+--------+  POP Prox    71                      triphasic            +-----------+--------+-----+--------+----------+--------+  TP Trunk    55                      triphasic            +-----------+--------+-----+--------+----------+--------+  ATA Distal  23                      monophasic           +-----------+--------+-----+--------+----------+--------+  PTA Distal  18                       monophasic           +-----------+--------+-----+--------+----------+--------+  PERO Distal 24                      monophasic           +-----------+--------+-----+--------+----------+--------+  +-----------+--------+-----+--------+----------+--------+  LEFT        PSV cm/s Ratio Stenosis Waveform   Comments  +-----------+--------+-----+--------+----------+--------+  CFA Distal  100                     monophasic           +-----------+--------+-----+--------+----------+--------+  DFA         160                     triphasic            +-----------+--------+-----+--------+----------+--------+  SFA Prox    91                      monophasic           +-----------+--------+-----+--------+----------+--------+  SFA Mid     161                     monophasic           +-----------+--------+-----+--------+----------+--------+  SFA Distal  109                     monophasic           +-----------+--------+-----+--------+----------+--------+  POP Distal  53                      monophasic           +-----------+--------+-----+--------+----------+--------+  TP Trunk    67                      monophasic           +-----------+--------+-----+--------+----------+--------+  ATA Distal  11                      monophasic           +-----------+--------+-----+--------+----------+--------+  PTA Distal  23                      monophasic           +-----------+--------+-----+--------+----------+--------+  PERO Distal 19                      monophasic           +-----------+--------+-----+--------+----------+--------+  Summary: Right: No obvious evidence of hemodynamically significant stenosis with distal dampened of flow. Left: No obvious evidence of hemodynamically significant stenosis with distal dampened of flow.  See table(s) above for measurements and observations. Electronically signed by Servando Snare MD on 06/17/2020 at 4:30:37 PM.    Final     Medications:  Scheduled:  chlorhexidine gluconate (MEDLINE KIT)  15 mL  Mouth Rinse BID   Chlorhexidine Gluconate Cloth  6 each Topical Daily   feeding supplement (PROSource TF)  45 mL Per Tube TID   insulin aspart  0-9 Units Subcutaneous Q4H   mouth rinse  15 mL Mouth Rinse 10 times per day   pantoprazole sodium  40 mg Per Tube Q1200   sodium chloride flush  10-40 mL Intracatheter Q12H   Continuous:   prismasol BGK 4/2.5 500 mL/hr at 06/19/20 0003    prismasol BGK 4/2.5 500 mL/hr at 06/19/20 0140   sodium chloride 10 mL/hr at 06/19/20 0600   ceFEPime (MAXIPIME) IV Stopped (06/19/20 0435)   feeding supplement (VITAL AF 1.2 CAL) 1,000 mL (06/19/20 0400)   heparin 1,150 Units/hr (06/19/20 0600)   norepinephrine (LEVOPHED) Adult infusion Stopped (06/18/20 1706)   prismasol BGK 4/2.5 2,000 mL/hr at 06/19/20 5248     Assessment: 34 year old male with a PMHx significant for depression and drug abuse. He [facility] to [facility] after being found unresponsive in bed, last seen by family one week prior. It is unclear how long he was unresponsive, but considering the large pressure sores that were seen across his back and lower limbs and soiled presentation it is likely that he was stationary for several days. - MRI brain on 11/14 revealed diffuse white matter abnormality on diffusion-weighted imaging throughout both cerebral and cerebellar hemispheres, most consistent with hypoxic ischemic injury. - EEG 11/15: Findings suggestive of severe diffuse encephalopathy, nonspecific to etiology.  No seizures or epileptiform discharges were seen throughout the recording. Of note, patient was noted to be biting down on the tube during EEG without concomitant EEG change and was not consistent with seizure - Given continued findings of poor neuro exam even off sedation x 72 hours, combined with the findings of anoxic brain injury on MRI brain, the likelihood of a meaningful long term neurological recovery is felt to be low.   Recommendations: 1. Continue supportive care    2. Would have family meeting regarding goals of care.  3. Neurology service will follow PRN. Please call if there are additional questions.     LOS: 4 days   _0  signed: Dr. Kerney Elbe 06/19/2020  7:58 AM

## 2020-06-19 NOTE — Progress Notes (Signed)
ANTICOAGULATION CONSULT NOTE   Pharmacy Consult for heparin Indication: DVT   Patient Measurements: Height: 6\' 2"  (188 cm) Weight: 89.4 kg (197 lb 1.5 oz) IBW/kg (Calculated) : 82.2 Heparin Dosing Weight: 88kg   Labs: Recent Labs    06/16/20 1405 06/16/20 2015 06/17/20 0305 06/17/20 0440 06/17/20 0547 06/17/20 1140 06/18/20 0218 06/18/20 0218 06/18/20 0933 06/18/20 1655 06/19/20 0430  HGB  --   --  14.8   < >  --   --  13.9  --   --   --  12.4*  HCT  --   --  44.6  --   --   --  41.0  --   --   --  37.8*  PLT  --   --  65*  --   --   --  105*  --   --   --  148*  LABPROT 20.6*  --  19.2*  --   --   --  16.4*  --   --   --  14.2  INR 1.8*  --  1.7*  --   --   --  1.4*  --   --   --  1.1  HEPARINUNFRC  --    < >  --    < >  --    < > 0.17*   < > 0.25* 0.39 0.36  CREATININE 6.28*  --  4.05*  --   --    < > 2.07*  --   --  1.73* 1.76*  CKTOTAL 3,990*  --   --   --  3,896*  --   --   --   --   --   --   CKMB 36.1*  --   --   --   --   --   --   --   --   --   --    < > = values in this interval not displayed.    Estimated Creatinine Clearance: 70.1 mL/min (A) (by C-G formula based on SCr of 1.76 mg/dL (H)).     Assessment: 34 year old male found down for unknown length of time, minimally responsive by family. Originally thought to be in DIC, hgb currently stable, plt low at 148. D-dimer>20, fibrinogen low at 63. New large bilateral DVTs noted on dopplers.  Pharmacy dosing heparin.   Heparin level therapeutic at 0.36 on 1150 units/hr.  Goal of Therapy:  Heparin level 0.3-0.7 units/ml Monitor platelets by anticoagulation protocol: Yes   Plan:  -Continue heparin 1150 units/h -Daily heparin level and CBC - Monitor for s/sx of bleed  36, PharmD, [facility] Pharm[facility] Resident [phone] 06/19/2020 7:19 AM

## 2020-06-19 NOTE — Progress Notes (Signed)
[facility] ROUNDING NOTE   Subjective:   Brief history This is a 34 year old gentleman with a history of ADHD brought to the emergency room after being found down on the floor with minimal responsiveness in the GCS of 3.  He had acute kidney injury with BUN 315 creatinine 17 and potassium greater than 7.5.  The etiology of his acute kidney injury was thought to be ATN in the setting of severe dehydration hypotension and rhabdomyolysis.  His urinalysis is without protein.  His renal ultrasound showed increased echogenicity.  CRRT has been initiated.  Urine output 425 cc 06/16/2020.  Appears to be in keep even on CRRT. PrismaSol  4/2.5 replacement and dialysate.  Subjective Diffuse encephalopathy with no epileptiform changes noted on EEG  Blood pressure 90/54 pulse 81 temperature 98 O2 sats 98% FiO2 40%  IV vasopressin, IV phenylephrine, IV heparin IV cefepime IV vancomycin  Sodium 134 potassium 4.5 chloride 104 CO2 21 BUN 50 creatinine 1.76 glucose 103 calcium 8.3 phosphorus 3.2 magnesium 2.6 hemoglobin 12.4   Objective:  Vital signs in last 24 hours:  Temp:  [98.6 F (37 C)-99.3 F (37.4 C)] 99.3 F (37.4 C) (11/17 0600) Pulse Rate:  [74-93] 74 (11/17 0351) Resp:  [12-40] 21 (11/17 0600) BP: (85-131)/(51-80) 90/59 (11/17 0600) SpO2:  [93 %-100 %] 100 % (11/17 0600) Arterial Line BP: (96-152)/(41-82) 122/58 (11/17 0600) FiO2 (%):  [40 %] 40 % (11/17 0400) Weight:  [89.4 kg] 89.4 kg (11/17 0600)  Weight change: -1.5 kg Filed Weights   06/17/20 0304 06/18/20 0400 06/19/20 0600  Weight: 89.7 kg 90.9 kg 89.4 kg    Intake/Output: I/O last 3 completed shifts: In: 3442.3 [I.V.:866; NG/GT:2276.3; IV Piggyback:299.9] Out: 3617 [Urine:71; Other:3546]   Intake/Output this shift:  No intake/output data recorded.  General: Critically ill looking male with dry mucous membrane, sedated, intubated Heart:RRR, s1s2 nl Lungs: Coarse breath sound bilateral Abdomen:soft,  Non-tender, non-distended Extremities: Multiple necrotic skin changes in lower extremity Dialysis Access: Right femoral temporary HD catheter placed by ICU on 11/13.   Basic Metabolic Panel: Recent Labs  Lab 06/16/20 0420 06/16/20 0431 06/17/20 0305 06/17/20 0305 06/17/20 1523 06/17/20 1523 06/18/20 0218 06/18/20 1655 06/19/20 0430  NA 158*   < > 139  --  136  --  135 134* 134*  K 6.3*   < > 4.4  --  5.0  --  4.9 4.8 4.5  CL 119*   < > 102  --  101  --  103 103 104  CO2 17*   < > 21*  --  19*  --  21* 19* 21*  GLUCOSE 129*   < > 113*  --  92  --  115* 135* 103*  BUN 245*   < > 83*  --  56*  --  49* 48* 50*  CREATININE 11.62*   < > 4.05*  --  2.61*  --  2.07* 1.73* 1.76*  CALCIUM 8.3*   < > 8.3*   < > 8.5*   < > 8.7* 8.4* 8.3*  MG 3.6*  --  2.6*  --   --   --  2.7* 2.9* 2.6*  PHOS 10.2*   < > 5.5*  --  5.3*  --  4.3 3.9 3.2   < > = values in this interval not displayed.    Liver Function Tests: Recent Labs  Lab 06/07/2020 1120 06/16/20 0420 06/17/20 0305 06/17/20 1523 06/18/20 0218 06/18/20 1655 06/19/20 0430  AST 41  --   --   --   --   --   --  ALT 184*  --   --   --   --   --   --   ALKPHOS 55  --   --   --   --   --   --   BILITOT 1.9*  --   --   --   --   --   --   PROT 6.4*  --   --   --   --   --   --   ALBUMIN 3.1*   < > 2.6* 2.6* 2.5* 2.3* 2.1*   < > = values in this interval not displayed.   No results for input(s): LIPASE, AMYLASE in the last 168 hours. Recent Labs  Lab 06/19/2020 1831  AMMONIA 41*    CBC: Recent Labs  Lab 06/19/2020 1120 06/20/2020 1141 06/24/2020 1740 06/29/2020 1801 06/16/20 0420 06/16/20 0431 06/17/20 0305 06/18/20 0218 06/19/20 0430  WBC 14.5*   < > 17.6*  --  16.8*  --  21.5* 24.0* 30.3*  NEUTROABS 12.5*  --   --   --   --   --   --   --   --   HGB 16.1   < > 16.0   < > 16.3 15.6 14.8 13.9 12.4*  HCT 50.7   < > 49.2   < > 49.9 46.0 44.6 41.0 37.8*  MCV 98.8   < > 95.2  --  95.6  --  92.5 93.0 94.3  PLT 66*   < > 58*  58*   --  60*  --  65* 105* 148*   < > = values in this interval not displayed.    Cardiac Enzymes: Recent Labs  Lab 06/03/2020 1120 06/16/20 1405 06/17/20 0547  CKTOTAL 2,353* 3,990* 3,896*  CKMB  --  36.1*  --     BNP: Invalid input(s): POCBNP  CBG: Recent Labs  Lab 06/18/20 1127 06/18/20 1518 06/18/20 2045 06/18/20 2354 06/19/20 0428  GLUCAP 106* 140* 104* 105* 110*    Microbiology: Results for orders placed or performed during the hospital encounter of 06/11/2020  Respiratory Panel by RT PCR (Flu A&B, Covid) - Nasopharyngeal Swab     Status: None   Collection Time: 06/06/2020 10:41 AM   Specimen: Nasopharyngeal Swab  Result Value Ref Range Status   SARS Coronavirus 2 by RT PCR NEGATIVE NEGATIVE Final    Comment: (NOTE) SARS-CoV-2 target nucleic acids are NOT DETECTED.  The SARS-CoV-2 RNA is generally detectable in upper respiratoy specimens during the acute phase of infection. The lowest concentration of SARS-CoV-2 viral copies this assay can detect is 131 copies/mL. A negative result does not preclude SARS-Cov-2 infection and should not be used as the sole basis for treatment or other patient management decisions. A negative result may occur with  improper specimen collection/handling, submission of specimen other than nasopharyngeal swab, presence of viral mutation(s) within the areas targeted by this assay, and inadequate number of viral copies (<131 copies/mL). A negative result must be combined with clinical observations, patient history, and epidemiological information. The expected result is Negative.  Fact Sheet for Patients:  PinkCheek.be  Fact Sheet for Healthcare Providers:  GravelBags.it  This test is no t yet approved or cleared by the [place] FDA and  has been authorized for detection and/or diagnosis of SARS-CoV-2 by FDA under an Emergency Use Authorization (EUA). This EUA will remain  in  effect (meaning this test can be used) for the duration of the COVID-19 declaration under Section 564(b)(1)  of the Act, 21 U.S.C. section 360bbb-3(b)(1), unless the authorization is terminated or revoked sooner.     Influenza A by PCR NEGATIVE NEGATIVE Final   Influenza B by PCR NEGATIVE NEGATIVE Final    Comment: (NOTE) The Xpert Xpress SARS-CoV-2/FLU/RSV assay is intended as an aid in  the diagnosis of influenza from Nasopharyngeal swab specimens and  should not be used as a sole basis for treatment. Nasal washings and  aspirates are unacceptable for Xpert Xpress SARS-CoV-2/FLU/RSV  testing.  Fact Sheet for Patients: PinkCheek.be  Fact Sheet for Healthcare Providers: GravelBags.it  This test is not yet approved or cleared by the [place] FDA and  has been authorized for detection and/or diagnosis of SARS-CoV-2 by  FDA under an Emergency Use Authorization (EUA). This EUA will remain  in effect (meaning this test can be used) for the duration of the  Covid-19 declaration under Section 564(b)(1) of the Act, 21  U.S.C. section 360bbb-3(b)(1), unless the authorization is  terminated or revoked. Performed at [facility], [place] [address], [place], [place] [address]   Blood culture (routine x 2)     Status: None (Preliminary result)   Collection Time: 06/08/2020 11:20 AM   Specimen: BLOOD LEFT ARM  Result Value Ref Range Status   Specimen Description BLOOD LEFT ARM  Final   Special Requests   Final    BOTTLES DRAWN AEROBIC AND ANAEROBIC Blood Culture adequate volume   Culture   Final    NO GROWTH 3 DAYS Performed at [facility], [place] [address], [place], [place] [address]    Report Status PENDING  Incomplete  MRSA PCR Screening     Status: None   Collection Time: 06/10/2020  5:57 PM   Specimen: Nasal Mucosa; Nasopharyngeal  Result Value Ref Range Status   MRSA by PCR NEGATIVE NEGATIVE Final    Comment:         The GeneXpert MRSA Assay (FDA approved for NASAL specimens only), is one component of a comprehensive MRSA colonization surveillance program. It is not intended to diagnose MRSA infection nor to guide or monitor treatment for MRSA infections. Performed at [facility], [place] [address], [place], [place] [address]     Coagulation Studies: Recent Labs    06/16/20 1405 06/17/20 0305 06/18/20 0218 06/19/20 0430  LABPROT 20.6* 19.2* 16.4* 14.2  INR 1.8* 1.7* 1.4* 1.1    Urinalysis: No results for input(s): COLORURINE, LABSPEC, PHURINE, GLUCOSEU, HGBUR, BILIRUBINUR, KETONESUR, PROTEINUR, UROBILINOGEN, NITRITE, LEUKOCYTESUR in the last 72 hours.  Invalid input(s): APPERANCEUR    Imaging: EEG adult  Result Date: 06/17/2020 Lora Havens, MD     06/17/2020 12:11 PM Patient Name: Jeancarlos Marchena MRN: [id number] Epilepsy Attending: Lora Havens Referring Physician/Provider: Salvadore Dom, NP Date: 06/17/2020 Duration: 24.24 minutes Patient history: 34 year old male with altered mental status.  EEG to evaluate for seizures. Level of alertness:  comatose AEDs during EEG study: None Technical aspects: This EEG study was done with scalp electrodes positioned according to the 10-20 International system of electrode placement. Electrical activity was acquired at a sampling rate of $Remov'500Hz'bnuFqO$  and reviewed with a high frequency filter of $RemoveB'70Hz'LwNGJaas$  and a low frequency filter of $RemoveB'1Hz'GZvXwlpe$ . EEG data were recorded continuously and digitally stored. Description: EEG showed continuous generalized rhythmic 2 to 3 Hz delta slowing.  Per documentation, patient was noted to be biting down onto during the EEG.  Concomitant EEG continued to show 2 to 3 Hz rhythmic delta slowing without  any evolution as well as significant myogenic artifact.  Hyperventilation and photic stimulation were not performed.   Of note, EEG was technically difficult due to significant myogenic artifact. ABNORMALITY -Continuous rhythmic slow,  generalized IMPRESSION: This technically difficult study is suggestive of severe diffuse encephalopathy, nonspecific etiology.  No seizures or epileptiform discharges were seen throughout the recording. Of note, patient was noted to be biting down on the tube during EEG without concomitant EEG change and was not consistent with seizure. Priyanka O Yadav   VAS [place] LOWER EXTREMITY ARTERIAL DUPLEX  Result Date: 06/17/2020 LOWER EXTREMITY ARTERIAL DUPLEX STUDY Indications: Poor pedal pulses bilaterally. Bilateral lower extremity DVT.  Current ABI: Not obtained due to DVT Limitations: Patient intubated; unable to cooperate. Bandaging in right groin              and bilateral ankles Comparison Study: No prior study Performing Technologist: Maudry Mayhew MHA, RDMS, RVT, RDCS  Examination Guidelines: A complete evaluation includes B-mode imaging, spectral Doppler, color Doppler, and power Doppler as needed of all accessible portions of each vessel. Bilateral testing is considered an integral part of a complete examination. Limited examinations for reoccurring indications may be performed as noted.  +-----------+--------+-----+--------+----------+--------+ RIGHT      PSV cm/sRatioStenosisWaveform  Comments +-----------+--------+-----+--------+----------+--------+ DFA        174                  triphasic          +-----------+--------+-----+--------+----------+--------+ SFA Prox   142                  monophasic         +-----------+--------+-----+--------+----------+--------+ SFA Mid    128                  triphasic          +-----------+--------+-----+--------+----------+--------+ SFA Distal 78                   triphasic          +-----------+--------+-----+--------+----------+--------+ POP Prox   71                   triphasic          +-----------+--------+-----+--------+----------+--------+ TP Trunk   55                   triphasic           +-----------+--------+-----+--------+----------+--------+ ATA Distal 23                   monophasic         +-----------+--------+-----+--------+----------+--------+ PTA Distal 18                   monophasic         +-----------+--------+-----+--------+----------+--------+ PERO Distal24                   monophasic         +-----------+--------+-----+--------+----------+--------+  +-----------+--------+-----+--------+----------+--------+ LEFT       PSV cm/sRatioStenosisWaveform  Comments +-----------+--------+-----+--------+----------+--------+ CFA Distal 100                  monophasic         +-----------+--------+-----+--------+----------+--------+ DFA        160                  triphasic          +-----------+--------+-----+--------+----------+--------+ SFA Prox   91  monophasic         +-----------+--------+-----+--------+----------+--------+ SFA Mid    161                  monophasic         +-----------+--------+-----+--------+----------+--------+ SFA Distal 109                  monophasic         +-----------+--------+-----+--------+----------+--------+ POP Distal 53                   monophasic         +-----------+--------+-----+--------+----------+--------+ TP Trunk   67                   monophasic         +-----------+--------+-----+--------+----------+--------+ ATA Distal 11                   monophasic         +-----------+--------+-----+--------+----------+--------+ PTA Distal 23                   monophasic         +-----------+--------+-----+--------+----------+--------+ PERO Distal19                   monophasic         +-----------+--------+-----+--------+----------+--------+  Summary: Right: No obvious evidence of hemodynamically significant stenosis with distal dampened of flow. Left: No obvious evidence of hemodynamically significant stenosis with distal dampened of flow.  See  table(s) above for measurements and observations. Electronically signed by Servando Snare MD on 06/17/2020 at 4:30:37 PM.    Final      Medications:   .  prismasol BGK 4/2.5 500 mL/hr at 06/19/20 0003  .  prismasol BGK 4/2.5 500 mL/hr at 06/19/20 0140  . sodium chloride 10 mL/hr at 06/19/20 0600  . ceFEPime (MAXIPIME) IV Stopped (06/19/20 0435)  . feeding supplement (VITAL AF 1.2 CAL) 1,000 mL (06/19/20 0400)  . heparin 1,150 Units/hr (06/19/20 0600)  . norepinephrine (LEVOPHED) Adult infusion Stopped (06/18/20 1706)  . prismasol BGK 4/2.5 2,000 mL/hr at 06/19/20 0649   . chlorhexidine gluconate (MEDLINE KIT)  15 mL Mouth Rinse BID  . Chlorhexidine Gluconate Cloth  6 each Topical Daily  . feeding supplement (PROSource TF)  45 mL Per Tube TID  . insulin aspart  0-9 Units Subcutaneous Q4H  . mouth rinse  15 mL Mouth Rinse 10 times per day  . pantoprazole sodium  40 mg Per Tube Q1200  . sodium chloride flush  10-40 mL Intracatheter Q12H   sodium chloride, docusate, fentaNYL (SUBLIMAZE) injection, heparin, heparin, midazolam, ondansetron (ZOFRAN) IV, polyethylene glycol, sodium chloride, sodium chloride flush  Assessment/ Plan:  1. Acute kidney injury likely ATN in the setting of severe dehydration/hypotension, rhabdomyolysis after found down on the floor. UA without proteinuria. Kidney ultrasound consistent with increased echogenicity without acute finding. Started CRRT for multiple electrolytes abnormalities including hyperkalemia.   Patient continues on 4/2.5 dialysate and replacement fluids electrolytes appear to be well-balanced.    2. Severe hyperkalemia with advanced renal failure: Now managed with CRRT, prescription changed as above.  Monitor lab.  3.Unresponsive/acute metabolic encephalopathy: MRI of the brain without hemorrhage or mass-effect however has hypoxic ischemic injury.    EEG revealing diffuse encephalopathy  4.Acute respiratory failure with hypoxia: intubated.  Manage byPCCM.  5.Hypernatremia: Appears to be resolved with CRRT  6 Acidosis: Resolved with CRRT  Prognosis guarded.  Discussed with ICU team.  LOS: [place] $RemoveBefore'@TODAY'mumUVIDERhlAu$ $RemoveB'@7'ZirYmyFp$ :03 AM

## 2020-06-19 NOTE — Progress Notes (Signed)
Dialysis filter is about the clot.  Goals of care meeting with family later today.  Will discontinue CRRT pending meeting

## 2020-06-20 ENCOUNTER — Inpatient Hospital Stay (HOSPITAL_COMMUNITY): Payer: Self-pay

## 2020-06-20 LAB — PROTIME-INR
INR: 1.2 (ref 0.8–1.2)
Prothrombin Time: 15 seconds (ref 11.4–15.2)

## 2020-06-20 LAB — RENAL FUNCTION PANEL
Albumin: 1.9 g/dL — ABNORMAL LOW (ref 3.5–5.0)
Albumin: 1.8 g/dL — ABNORMAL LOW (ref 3.5–5.0)
Anion gap: 14 (ref 5–15)
Anion gap: 15 (ref 5–15)
BUN: 94 mg/dL — ABNORMAL HIGH (ref 6–20)
BUN: 127 mg/dL — ABNORMAL HIGH (ref 6–20)
CO2: 18 mmol/L — ABNORMAL LOW (ref 22–32)
CO2: 18 mmol/L — ABNORMAL LOW (ref 22–32)
Calcium: 8.3 mg/dL — ABNORMAL LOW (ref 8.9–10.3)
Calcium: 8.2 mg/dL — ABNORMAL LOW (ref 8.9–10.3)
Chloride: 102 mmol/L (ref 98–111)
Chloride: 99 mmol/L (ref 98–111)
Creatinine, Ser: 3.35 mg/dL — ABNORMAL HIGH (ref 0.61–1.24)
Creatinine, Ser: 4.39 mg/dL — ABNORMAL HIGH (ref 0.61–1.24)
GFR, Estimated: 24 mL/min — ABNORMAL LOW (ref >60)
GFR, Estimated: 17 mL/min — ABNORMAL LOW (ref >60)
Glucose, Bld: 124 mg/dL — ABNORMAL HIGH (ref 70–99)
Glucose, Bld: 107 mg/dL — ABNORMAL HIGH (ref 70–99)
Phosphorus: 4.8 mg/dL — ABNORMAL HIGH (ref 2.5–4.6)
Phosphorus: 5.9 mg/dL — ABNORMAL HIGH (ref 2.5–4.6)
Potassium: 5.1 mmol/L (ref 3.5–5.1)
Potassium: 5.8 mmol/L — ABNORMAL HIGH (ref 3.5–5.1)
Sodium: 134 mmol/L — ABNORMAL LOW (ref 135–145)
Sodium: 132 mmol/L — ABNORMAL LOW (ref 135–145)

## 2020-06-20 LAB — GLUCOSE, CAPILLARY
Glucose-Capillary: 103 mg/dL — ABNORMAL HIGH (ref 70–99)
Glucose-Capillary: 106 mg/dL — ABNORMAL HIGH (ref 70–99)
Glucose-Capillary: 108 mg/dL — ABNORMAL HIGH (ref 70–99)
Glucose-Capillary: 111 mg/dL — ABNORMAL HIGH (ref 70–99)
Glucose-Capillary: 113 mg/dL — ABNORMAL HIGH (ref 70–99)
Glucose-Capillary: 116 mg/dL — ABNORMAL HIGH (ref 70–99)
Glucose-Capillary: 117 mg/dL — ABNORMAL HIGH (ref 70–99)
Glucose-Capillary: 118 mg/dL — ABNORMAL HIGH (ref 70–99)

## 2020-06-20 LAB — CBC
HCT: 35.1 % — ABNORMAL LOW (ref 39.0–52.0)
Hemoglobin: 11.4 g/dL — ABNORMAL LOW (ref 13.0–17.0)
MCH: 30.5 pg (ref 26.0–34.0)
MCHC: 32.5 g/dL (ref 30.0–36.0)
MCV: 93.9 fL (ref 80.0–100.0)
Platelets: 213 10*3/uL (ref 150–400)
RBC: 3.74 MIL/uL — ABNORMAL LOW (ref 4.22–5.81)
RDW: 13.5 % (ref 11.5–15.5)
WBC: 30.1 10*3/uL — ABNORMAL HIGH (ref 4.0–10.5)
nRBC: 1.9 % — ABNORMAL HIGH (ref 0.0–0.2)

## 2020-06-20 LAB — CULTURE, BLOOD (ROUTINE X 2)
Culture: NO GROWTH
Special Requests: ADEQUATE

## 2020-06-20 LAB — MAGNESIUM: Magnesium: 2.8 mg/dL — ABNORMAL HIGH (ref 1.7–2.4)

## 2020-06-20 LAB — HEPARIN LEVEL (UNFRACTIONATED): Heparin Unfractionated: 0.29 IU/mL — ABNORMAL LOW (ref 0.30–0.70)

## 2020-06-20 LAB — PROCALCITONIN: Procalcitonin: 18.38 ng/mL

## 2020-06-20 MED ORDER — VANCOMYCIN VARIABLE DOSE PER UNSTABLE RENAL FUNCTION (PHARMACIST DOSING): Status: DC

## 2020-06-20 MED ORDER — SODIUM CHLORIDE 0.9 % IV SOLN
2.0000 g | INTRAVENOUS | Status: AC
  Administered 2020-06-21: 2 g via INTRAVENOUS
  Filled 2020-06-20: qty 2

## 2020-06-20 MED ORDER — VANCOMYCIN HCL IN DEXTROSE 1-5 GM/200ML-% IV SOLN
1000.0000 mg | Freq: Once | INTRAVENOUS | Status: AC
  Administered 2020-06-20: 1000 mg via INTRAVENOUS
  Filled 2020-06-20: qty 200

## 2020-06-20 MED ORDER — ACETAMINOPHEN 160 MG/5ML PO SOLN
650.0000 mg | ORAL | Status: DC | PRN
  Administered 2020-06-20 – 2020-06-21 (×2): 650 mg
  Filled 2020-06-20 (×2): qty 20.3

## 2020-06-20 NOTE — Plan of Care (Signed)
  Problem: Clinical Measurements: Goal: Will remain free from infection Outcome: Not Progressing   Problem: Clinical Measurements: Goal: Respiratory complications will improve Outcome: Progressing   Problem: Nutrition: Goal: Adequate nutrition will be maintained Outcome: Progressing

## 2020-06-20 NOTE — Progress Notes (Signed)
NAME:  Derek Jennings, MRN:  [id number], DOB:  11/03/1985, LOS: 5 ADMISSION DATE:  Jun 20, 2020, CONSULTATION DATE: 11/13 REFERRING MD:  Dr. Lynelle Doctor, CHIEF COMPLAINT: Acute renal failure  Brief History   34 year old male presents to ED via EMS after being found minimally responsive by family.  Last seen normal 1 week prior to admission.  Known history of depression and drug abuse.  PCCM consulted for further management and admission   Past Medical History  Depression Drug abuse  Significant Hospital Events   Admitted 11/13 required intubation for airway protection, requiring multiple pressors for hemodynamic support CRRT started 11/14.  Bilateral DVTs on both lower extremities.  IV heparin started.  MRI obtained showing diffuse injury worrisome for an anoxic process 11/17 CRRT stopped  Consults:  Nephrology  Procedures:    Significant Diagnostic Tests:  Chest x-ray 11/13 >> Subtle patchy airspace disease at the left base. ECHO 11/13>> Left Ventricle: Left ventricular ejection fraction, by estimation, is 60  to 65%. The left ventricle has normal function. The left ventricle has no  regional wall motion abnormalities. The left ventricular internal cavity size was normal in size. There is mild left ventricular hypertrophy. Left ventricular diastolic parameters  are consistent with Grade I diastolic dysfunction (impaired relaxation). Right Ventricle: The right ventricular size is normal. Right ventricular systolic function is normal. Left Atrium: Left atrial size was normal in size.   LE vasc US 11/14>> RIGHT: - Findings consistent with acute deep vein thrombosis involving the right  femoral vein, right popliteal vein, right posterior tibial veins, and right peroneal veins.   LEFT: - Findings consistent with acute deep vein thrombosis involving the left  femoral vein, left proximal profunda vein, left popliteal vein, left posterior tibial veins, and left peroneal veins.   11/16  Bilateral LE arterial dopplers>>No obvious evidence of hemodynamically significant stenosis with distal dampened of flow.    Micro Data:  COVID and flu 11/13 >> negative MRSA 11/13>negative Blood cultures 11/13 >ng 06/19/2020 sputum>> Antimicrobials:  11/13 vancomycin>> 11/15 11/13 cefepime  Interim history/subjective:    Critically ill, intubated Febrile 101   Objective   Blood pressure 115/74, pulse (!) 104, temperature 99.9 F (37.7 C), resp. rate (!) 22, height 6\' 2"  (1.88 m), weight 90.3 kg, SpO2 99 %. CVP:  [3 mmHg-15 mmHg] 3 mmHg  Vent Mode: PRVC FiO2 (%):  [40 %] 40 % Set Rate:  [14 bmp] 14 bmp Vt Set:  [660 mL] 660 mL PEEP:  [5 cmH20] 5 cmH20 Pressure Support:  [12 cmH20] 12 cmH20 Plateau Pressure:  [9 cmH20-18 cmH20] 9 cmH20   Intake/Output Summary (Last 24 hours) at 06/20/2020 0947 Last data filed at 06/20/2020 06/22/2020 Gross per 24 hour  Intake 2262.92 ml  Output 370 ml  Net 1892.92 ml   Filed Weights   06/18/20 0400 06/19/20 0600 06/20/20 0500  Weight: 90.9 kg 89.4 kg 90.3 kg    Examination: Gen:      Young man, no distress , orally intubated, on low-dose Levophed HEENT:  EOMI, sclera anicteric, mild pallor Neck:     No JVD; no thyromegaly Lungs:    Bilateral ventilated breath sounds CV:         Regular rate and rhythm; no murmurs Abd:      + bowel sounds; soft, non-tender; no palpable masses, no distension Ext:    No edema; adequate peripheral perfusion Skin:      Warm and dry; no rash Neuro: Eyes half open, blinks intermittently and to name  being called, does not follow commands, no posturing today to deep pain stimulus   Chest x-ray 11/18 independently reviewed, clear lungs.  Labs show persistent leukocytosis , mild hypernatremia, increasing creatinine of dialysis, stable anemia, mild hyperkalemia   Resolved Hospital Problem list   Hyperkalemia  Hypernatremia  Thrombocytopenia, Coagulopathy  Assessment & Plan:    Acute metabolic  encephalopathy. multifactorial (uremia, anoxic brain injury, metabolic derangements) c/b concern for cerebellum infarct  -Found unresponsive by family after approximately 1 week downtime; felt consequence of overdose, UDS positive for amphetamines  -Brain MRI obtained overnight 11/14 with diffuse white matter abnormality most consistent with hypoxic/ischemic injury  P: Poor prognosis for meaningful recovery given by neurology, I am somewhat encouraged by his eyes opening and blinking on his name being called but I am not sure whether we are to expect any more recovery -Continue supportive care and avoid sedating medications    Acute hypoxemic respiratory failure due to inability to protect airway -In the setting of prolonged downtime and acute uremic encephalopathy.   P:  Tolerated spontaneous breathing trials pressure support 10/5    Acute renal failure likely ATN, Rhabdo and volume depletion  CRRT started 11/14,acid base improved. Hyperkalemia resolved P: Per nephrology Will likely need reinstitute either CRRT or intermittent HD   Circulatory shock  Lactic acidosis resolved  P: Taper off Levophed Goals mean arterial pressure greater than 65 With persistent leukocytosis and high procalcitonin we will add vancomycin to cefepime and repeat respiratory culture   BLE acute DVT Concern for decreased LE pulses earlier, arterial duplex without stenosis or decreased flow RV function okay so doubt PE P: Continue IV heparin  Goals of care 11/17 -discussed with mom, this is her only child.  She is clear that he would not want tracheostomy and nursing home status if he does not have neurological recovery.  Would continue supportive care to see if there is any more neurological improvement   Best practice:  Diet: tube feeds Pain/Anxiety/Delirium protocol (if indicated): Pad protocol, RASS goal 0 effective 11/15 VAP protocol (if indicated): In place DVT prophylaxis: Therapeutic  heparin started 11/14 for bilateral DVT GI prophylaxis: PPI Glucose control: SSI Mobility: Bedrest Code Status: Full code Family Communication: Mother Disposition: ICU   Critical care time 32 minutes   Cyril Mourning MD. FCCP. [facility] See Amion for pager  If no response to pager , please call [phone]  After 7:00 pm call Elink  [phone]   06/20/2020   06/20/2020, 9:47 AM

## 2020-06-20 NOTE — Progress Notes (Signed)
Pharmacy Antibiotic Note  Derek Jennings is a 34 y.o. male admitted on 06/03/2020 with sepsis.  Pharmacy has been consulted for vancomycin dosing. Patient has been on cefepime 2G BID since 11/13 and received vancomycin 11/13>11/15. Overnight, patient had fevers with Tmax 101 and WBC continue to increase, now at 30.1. Will restart vancomycin for empiric coverage for now per CCM. Patient was previously on CRRT starting 11/13 but is not currently on CRRT. Scr today is 3.35.   Plan: Vancomycin 1000mg  x1 today  Vancomycin variable dose per unstable renal function order entered  Reduce cefepime 2G IV from Q12H to Q24H Follow up respiratory and blood cultures  Monitor Scr, clinical status, and plans for iHD vs. CRRT  Height: 6\' 2"  (188 cm) Weight: 90.3 kg (199 lb 1.2 oz) IBW/kg (Calculated) : 82.2  Temp (24hrs), Avg:100.2 F (37.9 C), Min:99.3 F (37.4 C), Max:101.3 F (38.5 C)  Recent Labs  Lab 06/10/2020 1120 06/27/2020 1141 06/27/2020 1740 06/10/2020 1740 06/16/20 0420 06/16/20 1405 06/17/20 0305 06/17/20 0547 06/17/20 1523 06/18/20 0218 06/18/20 1655 06/19/20 0430 06/19/20 1654 06/20/20 0351  WBC 14.5*   < > 17.6*   < > 16.8*  --  21.5*  --   --  24.0*  --  30.3*  --  30.1*  CREATININE 17.03*   < > 14.50*   < > 11.62*   < > 4.05*  --    < > 2.07* 1.73* 1.76* 2.17* 3.35*  LATICACIDVEN 3.1*  --  2.0*  --   --   --   --  2.1*  --   --   --   --   --   --    < > = values in this interval not displayed.    Estimated Creatinine Clearance: 36.8 mL/min (A) (by C-G formula based on SCr of 3.35 mg/dL (H)).    Allergies  Allergen Reactions  . Amoxicillin Hives  . Penicillins Hives and Rash    Did it involve swelling of the face/tongue/throat, SOB, or low BP? Unknown Did it involve sudden or severe rash/hives, skin peeling, or any reaction on the inside of your mouth or nose? Unknown Did you need to seek medical attention at a hospital or doctor's office? Unknown When did it last  happen? If all above answers are "NO", may proceed with cephalosporin use.     Antimicrobials this admission: Vanc 11/13>>11/15 Cefepime 11/13>> CTX x 1 11/13  Dose adjustments this admission: Cefepime 2G IV Q12H to Cefepime 2G IV Q24H   Microbiology results: 11/13 BCx: ngtd  11/18 BCx: pend 11/18 Sputum: abundant WBC, moderate yeast  11/13 MRSA PCR: negative  Thank you for allowing pharmacy to be a part of this patient's care.  12/18, PharmD, MBA Pharmacy Resident [phone] 06/20/2020 10:03 AM

## 2020-06-20 NOTE — Progress Notes (Signed)
[facility] ROUNDING NOTE   Subjective:   Brief history This is a 34 year old gentleman with a history of ADHD brought to the emergency room after being found down on the floor with minimal responsiveness in the GCS of 3.  He had acute kidney injury with BUN 315 creatinine 17 and potassium greater than 7.5.  The etiology of his acute kidney injury was thought to be ATN in the setting of severe dehydration hypotension and rhabdomyolysis.  His urinalysis is without protein.  His renal ultrasound showed increased echogenicity.  CRRT has been initiated.  Oliguric.  CRRT was discontinued 06/19/2020  Subjective Family meeting.  Goals of care being set.  Family do not wish to have trach a feeding tube placed  Blood pressure 131/74 pulse 102 temperature 99 O2 sats 100 FiO2 40% CVP 3  IV Levophed, IV heparin IV cefepime IV vancomycin  Sodium 134 potassium 5.1 chloride 102 CO2 18 BUN 94 creatinine 3.34 glucose 124 calcium 8.3 phosphorus 4.8 magnesium 2.8 albumin 1.9 hemoglobin 11.4 WBC 30.1   Objective:  Vital signs in last 24 hours:  Temp:  [99 F (37.2 C)-101.3 F (38.5 C)] 99.5 F (37.5 C) (11/18 0600) Pulse Rate:  [0-104] 100 (11/18 0325) Resp:  [17-24] 23 (11/18 0600) BP: (76-131)/(47-93) 131/74 (11/18 0600) SpO2:  [96 %-100 %] 100 % (11/18 0600) Arterial Line BP: (87-160)/(44-79) 160/74 (11/18 0600) FiO2 (%):  [40 %] 40 % (11/18 0325) Weight:  [90.3 kg] 90.3 kg (11/18 0500)  Weight change: 0.9 kg Filed Weights   06/18/20 0400 06/19/20 0600 06/20/20 0500  Weight: 90.9 kg 89.4 kg 90.3 kg    Intake/Output: I/O last 3 completed shifts: In: 3904.3 [I.V.:800.9; NG/GT:2803.3; IV Piggyback:300.1] Out: 3252 [Urine:81; Other:3171]   Intake/Output this shift:  Total I/O In: 1157.8 [I.V.:287.6; NG/GT:770; IV Piggyback:100.2] Out: 75 [Urine:75]  General: Critically ill looking male with dry mucous membrane, sedated, intubated Heart:RRR, s1s2 nl Lungs: Coarse breath  sound bilateral Abdomen:soft, Non-tender, non-distended Extremities: Multiple necrotic skin changes in lower extremity Dialysis Access: Right femoral temporary HD catheter placed by ICU on 11/13.   Basic Metabolic Panel: Recent Labs  Lab 06/17/20 0305 06/17/20 1523 06/18/20 0218 06/18/20 0218 06/18/20 1655 06/18/20 1655 06/19/20 0430 06/19/20 1654 06/20/20 0351  NA 139   < > 135  --  134*  --  134* 133* 134*  K 4.4   < > 4.9  --  4.8  --  4.5 4.9 5.1  CL 102   < > 103  --  103  --  104 103 102  CO2 21*   < > 21*  --  19*  --  21* 18* 18*  GLUCOSE 113*   < > 115*  --  135*  --  103* 122* 124*  BUN 83*   < > 49*  --  48*  --  50* 62* 94*  CREATININE 4.05*   < > 2.07*  --  1.73*  --  1.76* 2.17* 3.35*  CALCIUM 8.3*   < > 8.7*   < > 8.4*   < > 8.3* 8.2* 8.3*  MG 2.6*  --  2.7*  --  2.9*  --  2.6*  --  2.8*  PHOS 5.5*   < > 4.3  --  3.9  --  3.2 3.7 4.8*   < > = values in this interval not displayed.    Liver Function Tests: Recent Labs  Lab 06/10/2020 1120 06/16/20 0420 06/18/20 0218 06/18/20 1655 06/19/20 0430 06/19/20  1654 06/20/20 0351  AST 41  --   --   --   --   --   --   ALT 184*  --   --   --   --   --   --   ALKPHOS 55  --   --   --   --   --   --   BILITOT 1.9*  --   --   --   --   --   --   PROT 6.4*  --   --   --   --   --   --   ALBUMIN 3.1*   < > 2.5* 2.3* 2.1* 1.9* 1.9*   < > = values in this interval not displayed.   No results for input(s): LIPASE, AMYLASE in the last 168 hours. Recent Labs  Lab 06/14/2020 1831  AMMONIA 41*    CBC: Recent Labs  Lab 06/05/2020 1120 06/22/2020 1141 06/16/20 0420 06/16/20 0420 06/16/20 0431 06/17/20 0305 06/18/20 0218 06/19/20 0430 06/20/20 0351  WBC 14.5*   < > 16.8*  --   --  21.5* 24.0* 30.3* 30.1*  NEUTROABS 12.5*  --   --   --   --   --   --   --   --   HGB 16.1   < > 16.3   < > 15.6 14.8 13.9 12.4* 11.4*  HCT 50.7   < > 49.9   < > 46.0 44.6 41.0 37.8* 35.1*  MCV 98.8   < > 95.6  --   --  92.5 93.0 94.3  93.9  PLT 66*   < > 60*  --   --  65* 105* 148* 213   < > = values in this interval not displayed.    Cardiac Enzymes: Recent Labs  Lab 06/29/2020 1120 06/16/20 1405 06/17/20 0547  CKTOTAL 2,353* 3,990* 3,896*  CKMB  --  36.1*  --     BNP: Invalid input(s): POCBNP  CBG: Recent Labs  Lab 06/19/20 1200 06/19/20 1624 06/19/20 1959 06/20/20 0012 06/20/20 0347  GLUCAP 95 126* 118* 118* 113*    Microbiology: Results for orders placed or performed during the hospital encounter of 06/20/2020  Respiratory Panel by RT PCR (Flu A&B, Covid) - Nasopharyngeal Swab     Status: None   Collection Time: 06/21/2020 10:41 AM   Specimen: Nasopharyngeal Swab  Result Value Ref Range Status   SARS Coronavirus 2 by RT PCR NEGATIVE NEGATIVE Final    Comment: (NOTE) SARS-CoV-2 target nucleic acids are NOT DETECTED.  The SARS-CoV-2 RNA is generally detectable in upper respiratoy specimens during the acute phase of infection. The lowest concentration of SARS-CoV-2 viral copies this assay can detect is 131 copies/mL. A negative result does not preclude SARS-Cov-2 infection and should not be used as the sole basis for treatment or other patient management decisions. A negative result may occur with  improper specimen collection/handling, submission of specimen other than nasopharyngeal swab, presence of viral mutation(s) within the areas targeted by this assay, and inadequate number of viral copies (<131 copies/mL). A negative result must be combined with clinical observations, patient history, and epidemiological information. The expected result is Negative.  Fact Sheet for Patients:  PinkCheek.be  Fact Sheet for Healthcare Providers:  GravelBags.it  This test is no t yet approved or cleared by the [place] FDA and  has been authorized for detection and/or diagnosis of SARS-CoV-2 by FDA under an Emergency Use Authorization (EUA).  This EUA will remain  in effect (meaning this test can be used) for the duration of the COVID-19 declaration under Section 564(b)(1) of the Act, 21 U.S.C. section 360bbb-3(b)(1), unless the authorization is terminated or revoked sooner.     Influenza A by PCR NEGATIVE NEGATIVE Final   Influenza B by PCR NEGATIVE NEGATIVE Final    Comment: (NOTE) The Xpert Xpress SARS-CoV-2/FLU/RSV assay is intended as an aid in  the diagnosis of influenza from Nasopharyngeal swab specimens and  should not be used as a sole basis for treatment. Nasal washings and  aspirates are unacceptable for Xpert Xpress SARS-CoV-2/FLU/RSV  testing.  Fact Sheet for Patients: PinkCheek.be  Fact Sheet for Healthcare Providers: GravelBags.it  This test is not yet approved or cleared by the [place] FDA and  has been authorized for detection and/or diagnosis of SARS-CoV-2 by  FDA under an Emergency Use Authorization (EUA). This EUA will remain  in effect (meaning this test can be used) for the duration of the  Covid-19 declaration under Section 564(b)(1) of the Act, 21  U.S.C. section 360bbb-3(b)(1), unless the authorization is  terminated or revoked. Performed at [facility], [place] [address], [place], [place] [address]   Blood culture (routine x 2)     Status: None (Preliminary result)   Collection Time: 06/20/2020 11:20 AM   Specimen: BLOOD LEFT ARM  Result Value Ref Range Status   Specimen Description BLOOD LEFT ARM  Final   Special Requests   Final    BOTTLES DRAWN AEROBIC AND ANAEROBIC Blood Culture adequate volume   Culture   Final    NO GROWTH 4 DAYS Performed at [facility], [place] [address], [place], [place] [address]    Report Status PENDING  Incomplete  MRSA PCR Screening     Status: None   Collection Time: 06/27/2020  5:57 PM   Specimen: Nasal Mucosa; Nasopharyngeal  Result Value Ref Range Status   MRSA by PCR NEGATIVE  NEGATIVE Final    Comment:        The GeneXpert MRSA Assay (FDA approved for NASAL specimens only), is one component of a comprehensive MRSA colonization surveillance program. It is not intended to diagnose MRSA infection nor to guide or monitor treatment for MRSA infections. Performed at [facility], [place] [address] [place], [place] [address]   Culture, respiratory (non-expectorated)     Status: None (Preliminary result)   Collection Time: 06/19/20  8:57 AM   Specimen: Tracheal Aspirate; Respiratory  Result Value Ref Range Status   Specimen Description TRACHEAL ASPIRATE  Final   Special Requests NONE  Final   Gram Stain   Final    RARE WBC PRESENT,BOTH PMN AND MONONUCLEAR RARE YEAST Performed at [facility], [address] [place], [place] [address]    Culture PENDING  Incomplete   Report Status PENDING  Incomplete  Culture, respiratory (non-expectorated)     Status: None (Preliminary result)   Collection Time: 06/20/20 12:55 AM   Specimen: Tracheal Aspirate; Respiratory  Result Value Ref Range Status   Specimen Description TRACHEAL ASPIRATE  Final   Special Requests NONE  Final   Gram Stain   Final    ABUNDANT WBC PRESENT, PREDOMINANTLY PMN MODERATE YEAST Performed at [facility], [address] [place], [place] [address]    Culture PENDING  Incomplete   Report Status PENDING  Incomplete    Coagulation Studies: Recent Labs    06/18/20 0218 06/19/20  0430 06/20/20 0351  LABPROT 16.4* 14.2 15.0  INR 1.4* 1.1 1.2    Urinalysis: No results for input(s): COLORURINE, LABSPEC, PHURINE, GLUCOSEU, HGBUR, BILIRUBINUR, KETONESUR, PROTEINUR, UROBILINOGEN, NITRITE, LEUKOCYTESUR in the last 72 hours.  Invalid input(s): APPERANCEUR    Imaging: DG Chest Port 1 View  Result Date: 06/19/2020 CLINICAL DATA:  Hypoxia EXAM: PORTABLE CHEST 1 VIEW COMPARISON:  June 15, 2020 FINDINGS: Endotracheal tube tip is 5.5 cm above the carina. Nasogastric tube  tip and side port are below the diaphragm. Central catheter tip is at the junction of the left innominate vein and superior vena cava. No pneumothorax. There is apical pleural thickening bilaterally, stable. Lungs otherwise are clear. Heart size and pulmonary vascularity are normal. No adenopathy. No bone lesions. IMPRESSION: Tube and catheter positions as described without pneumothorax. No edema or airspace opacity. Heart size normal. Electronically Signed   By: Lowella Grip III M.D.   On: 06/19/2020 08:10     Medications:   .  prismasol BGK 4/2.5 500 mL/hr at 06/19/20 1216  .  prismasol BGK 4/2.5 500 mL/hr at 06/19/20 1044  . sodium chloride 10 mL/hr at 06/20/20 0600  . ceFEPime (MAXIPIME) IV Stopped (06/20/20 0447)  . feeding supplement (VITAL AF 1.2 CAL) 70 mL/hr at 06/20/20 0600  . heparin 1,150 Units/hr (06/20/20 0600)  . norepinephrine (LEVOPHED) Adult infusion 5 mcg/min (06/20/20 0600)  . prismasol BGK 4/2.5 2,000 mL/hr at 06/19/20 1245   . chlorhexidine gluconate (MEDLINE KIT)  15 mL Mouth Rinse BID  . Chlorhexidine Gluconate Cloth  6 each Topical Daily  . feeding supplement (PROSource TF)  45 mL Per Tube TID  . insulin aspart  0-9 Units Subcutaneous Q4H  . mouth rinse  15 mL Mouth Rinse 10 times per day  . pantoprazole sodium  40 mg Per Tube Q1200  . sodium chloride flush  10-40 mL Intracatheter Q12H   sodium chloride, acetaminophen (TYLENOL) oral liquid 160 mg/5 mL, docusate, fentaNYL (SUBLIMAZE) injection, heparin, heparin, midazolam, ondansetron (ZOFRAN) IV, polyethylene glycol, sodium chloride, sodium chloride flush  Assessment/ Plan:  1. Acute kidney injury likely ATN in the setting of severe dehydration/hypotension, rhabdomyolysis after found down on the floor. UA without proteinuria. Kidney ultrasound consistent with increased echogenicity without acute finding. Started CRRT for multiple electrolytes abnormalities including hyperkalemia.   CRRT discontinued on  06/19/2020.  No indications for urgent dialysis at this time.  Am awaiting direction from critical care medicine.  Goals of care meeting has been set.    2. Severe hyperkalemia appears to resolved with CRRT  3.Unresponsive/acute metabolic encephalopathy: MRI of the brain without hemorrhage or mass-effect however has hypoxic ischemic injury.    EEG revealing diffuse encephalopathy.  Family did not appear to wish for trach and feeding tube placement.  4.Acute respiratory failure with hypoxia: intubated. Manage byPCCM.  5.Hypernatremia: Appears to be resolved with CRRT  6 Acidosis: Resolved with CRRT  Prognosis guarded.  Discussed with ICU team.   LOS: [place] $RemoveBeforeD'@TODAY'[email or web address]$ $RemoveBe'@6'[id number]$ :37 AM

## 2020-06-20 NOTE — Progress Notes (Signed)
ANTICOAGULATION CONSULT NOTE   Pharmacy Consult for heparin Indication: DVT   Patient Measurements: Height: 6\' 2"  (188 cm) Weight: 90.3 kg (199 lb 1.2 oz) IBW/kg (Calculated) : 82.2 Heparin Dosing Weight: 88kg   Labs: Recent Labs    06/18/20 0218 06/18/20 0218 06/18/20 0933 06/18/20 1655 06/19/20 0430 06/19/20 1654 06/20/20 0351  HGB 13.9   < >  --   --  12.4*  --  11.4*  HCT 41.0  --   --   --  37.8*  --  35.1*  PLT 105*  --   --   --  148*  --  213  LABPROT 16.4*  --   --   --  14.2  --  15.0  INR 1.4*  --   --   --  1.1  --  1.2  HEPARINUNFRC 0.17*  --    < > 0.39 0.36  --  0.29*  CREATININE 2.07*   < >  --  1.73* 1.76* 2.17* 3.35*   < > = values in this interval not displayed.    Estimated Creatinine Clearance: 36.8 mL/min (A) (by C-G formula based on SCr of 3.35 mg/dL (H)).     Assessment: 34 year old male found down for unknown length of time, minimally responsive by family. Originally thought to be in DIC, hgb currently stable, plt low at 213. D-dimer>20, fibrinogen low at 63. New large bilateral DVTs noted on dopplers.  Pharmacy dosing heparin.   Heparin level slightly subtherapeutic at 0.29 on 1150 units/hr. No bleeding noted on observation.   Goal of Therapy:  Heparin level 0.3-0.7 units/ml Monitor platelets by anticoagulation protocol: Yes   Plan:  - Increase heparin to 1250 units/h - 8- hour heparin level - Daily heparin level and CBC - Monitor for s/sx of bleed  36, PharmD, [facility] Pharmacy Resident [phone] 06/20/2020 9:13 AM

## 2020-06-21 LAB — GLUCOSE, CAPILLARY
Glucose-Capillary: 113 mg/dL — ABNORMAL HIGH (ref 70–99)
Glucose-Capillary: 98 mg/dL (ref 70–99)
Glucose-Capillary: 106 mg/dL — ABNORMAL HIGH (ref 70–99)
Glucose-Capillary: 104 mg/dL — ABNORMAL HIGH (ref 70–99)
Glucose-Capillary: 101 mg/dL — ABNORMAL HIGH (ref 70–99)
Glucose-Capillary: 125 mg/dL — ABNORMAL HIGH (ref 70–99)
Glucose-Capillary: 119 mg/dL — ABNORMAL HIGH (ref 70–99)
Glucose-Capillary: 128 mg/dL — ABNORMAL HIGH (ref 70–99)

## 2020-06-21 LAB — CBC
HCT: 31.4 % — ABNORMAL LOW (ref 39.0–52.0)
Hemoglobin: 10.2 g/dL — ABNORMAL LOW (ref 13.0–17.0)
MCH: 30.6 pg (ref 26.0–34.0)
MCHC: 32.5 g/dL (ref 30.0–36.0)
MCV: 94.3 fL (ref 80.0–100.0)
Platelets: 259 10*3/uL (ref 150–400)
RBC: 3.33 MIL/uL — ABNORMAL LOW (ref 4.22–5.81)
RDW: 14.2 % (ref 11.5–15.5)
WBC: 25.7 10*3/uL — ABNORMAL HIGH (ref 4.0–10.5)
nRBC: 0.5 % — ABNORMAL HIGH (ref 0.0–0.2)

## 2020-06-21 LAB — HEPATITIS B SURFACE ANTIGEN: Hepatitis B Surface Ag: NONREACTIVE

## 2020-06-21 LAB — RENAL FUNCTION PANEL
Albumin: 1.8 g/dL — ABNORMAL LOW (ref 3.5–5.0)
Albumin: 1.7 g/dL — ABNORMAL LOW (ref 3.5–5.0)
Anion gap: 14 (ref 5–15)
Anion gap: 16 — ABNORMAL HIGH (ref 5–15)
BUN: 151 mg/dL — ABNORMAL HIGH (ref 6–20)
BUN: 85 mg/dL — ABNORMAL HIGH (ref 6–20)
CO2: 17 mmol/L — ABNORMAL LOW (ref 22–32)
CO2: 20 mmol/L — ABNORMAL LOW (ref 22–32)
Calcium: 8.4 mg/dL — ABNORMAL LOW (ref 8.9–10.3)
Calcium: 8.4 mg/dL — ABNORMAL LOW (ref 8.9–10.3)
Chloride: 102 mmol/L (ref 98–111)
Chloride: 101 mmol/L (ref 98–111)
Creatinine, Ser: 5.01 mg/dL — ABNORMAL HIGH (ref 0.61–1.24)
Creatinine, Ser: 3.2 mg/dL — ABNORMAL HIGH (ref 0.61–1.24)
GFR, Estimated: 15 mL/min — ABNORMAL LOW (ref >60)
GFR, Estimated: 25 mL/min — ABNORMAL LOW (ref >60)
Glucose, Bld: 111 mg/dL — ABNORMAL HIGH (ref 70–99)
Glucose, Bld: 137 mg/dL — ABNORMAL HIGH (ref 70–99)
Phosphorus: 7.1 mg/dL — ABNORMAL HIGH (ref 2.5–4.6)
Phosphorus: 5.1 mg/dL — ABNORMAL HIGH (ref 2.5–4.6)
Potassium: 5.9 mmol/L — ABNORMAL HIGH (ref 3.5–5.1)
Potassium: 4.2 mmol/L (ref 3.5–5.1)
Sodium: 133 mmol/L — ABNORMAL LOW (ref 135–145)
Sodium: 137 mmol/L (ref 135–145)

## 2020-06-21 LAB — PROTIME-INR
INR: 1.2 (ref 0.8–1.2)
Prothrombin Time: 14.9 seconds (ref 11.4–15.2)

## 2020-06-21 LAB — MAGNESIUM: Magnesium: 2.8 mg/dL — ABNORMAL HIGH (ref 1.7–2.4)

## 2020-06-21 LAB — PROCALCITONIN: Procalcitonin: 23.39 ng/mL

## 2020-06-21 LAB — VANCOMYCIN, RANDOM: Vancomycin Rm: 22

## 2020-06-21 LAB — HEPARIN LEVEL (UNFRACTIONATED): Heparin Unfractionated: 0.59 IU/mL (ref 0.30–0.70)

## 2020-06-21 MED ORDER — CHLORHEXIDINE GLUCONATE CLOTH 2 % EX PADS
6.0000 | MEDICATED_PAD | Freq: Every day | CUTANEOUS | Status: DC
  Administered 2020-06-21: 6 via TOPICAL

## 2020-06-21 MED ORDER — SODIUM CHLORIDE 0.9 % IV SOLN: 100.0000 mL | INTRAVENOUS | Status: DC | PRN

## 2020-06-21 MED ORDER — SODIUM CHLORIDE 0.9 % FOR CRRT: INTRAVENOUS_CENTRAL | Status: DC | PRN

## 2020-06-21 MED ORDER — PENTAFLUOROPROP-TETRAFLUOROETH EX AERO: 1.0000 "application " | INHALATION_SPRAY | CUTANEOUS | Status: DC | PRN

## 2020-06-21 MED ORDER — LIDOCAINE HCL (PF) 1 % IJ SOLN: 5.0000 mL | INTRAMUSCULAR | Status: DC | PRN

## 2020-06-21 MED ORDER — HEPARIN SODIUM (PORCINE) 1000 UNIT/ML DIALYSIS
1000.0000 [IU] | INTRAMUSCULAR | Status: DC | PRN
  Administered 2020-06-21: 1000 [IU] via INTRAVENOUS_CENTRAL
  Filled 2020-06-21: qty 1

## 2020-06-21 MED ORDER — PRISMASOL BGK 0/2.5 32-2.5 MEQ/L EC SOLN
Status: DC
  Filled 2020-06-21 (×7): qty 5000

## 2020-06-21 MED ORDER — HEPARIN SODIUM (PORCINE) 1000 UNIT/ML DIALYSIS
1000.0000 [IU] | INTRAMUSCULAR | Status: DC | PRN
  Filled 2020-06-21: qty 6

## 2020-06-21 MED ORDER — SODIUM ZIRCONIUM CYCLOSILICATE 5 G PO PACK
5.0000 g | PACK | Freq: Once | ORAL | Status: AC
  Administered 2020-06-21: 5 g
  Filled 2020-06-21: qty 1

## 2020-06-21 MED ORDER — SODIUM CHLORIDE 0.9 % IV SOLN: 2.0000 g | INTRAVENOUS | Status: DC

## 2020-06-21 MED ORDER — PRISMASOL BGK 0/2.5 32-2.5 MEQ/L EC SOLN
Status: DC
  Filled 2020-06-21 (×2): qty 5000

## 2020-06-21 MED ORDER — ALTEPLASE 2 MG IJ SOLR: 2.0000 mg | Freq: Once | INTRAMUSCULAR | Status: DC | PRN

## 2020-06-21 MED ORDER — CHLORHEXIDINE GLUCONATE 0.12 % MT SOLN
OROMUCOSAL | Status: AC
  Administered 2020-06-21: 15 mL
  Filled 2020-06-21: qty 15

## 2020-06-21 MED ORDER — LIDOCAINE-PRILOCAINE 2.5-2.5 % EX CREA: 1.0000 "application " | TOPICAL_CREAM | CUTANEOUS | Status: DC | PRN

## 2020-06-21 MED ORDER — SODIUM CHLORIDE 0.9 % IV SOLN
1.0000 g | INTRAVENOUS | Status: AC
  Administered 2020-06-21 – 2020-06-23 (×3): 1 g via INTRAVENOUS
  Filled 2020-06-21 (×3): qty 1

## 2020-06-21 MED ORDER — CHLORHEXIDINE GLUCONATE CLOTH 2 % EX PADS
6.0000 | MEDICATED_PAD | Freq: Every day | CUTANEOUS | Status: DC
  Administered 2020-06-22 – 2020-06-25 (×3): 6 via TOPICAL

## 2020-06-21 NOTE — Progress Notes (Addendum)
NAME:  Derek Jennings, MRN:  [id number], DOB:  10-10-1985, LOS: 6 ADMISSION DATE:  06/27/2020, CONSULTATION DATE: 11/13 REFERRING MD:  Dr. Lynelle Doctor, CHIEF COMPLAINT: Acute renal failure  Brief History   34 year old male presents to ED via EMS after being found minimally responsive by family.  Last seen normal 1 week prior to admission.  Known history of depression and drug abuse.  PCCM consulted for further management and admission   Past Medical History  Depression Drug abuse  Significant Hospital Events   Admitted 11/13 required intubation for airway protection, requiring multiple pressors for hemodynamic support CRRT started 11/14.  Bilateral DVTs on both lower extremities.  IV heparin started.  MRI obtained showing diffuse injury worrisome for an anoxic process 11/17 CRRT stopped  Consults:  Nephrology  Procedures:    Significant Diagnostic Tests:  Chest x-ray 11/13 >> Subtle patchy airspace disease at the left base. ECHO 11/13>> Left Ventricle: Left ventricular ejection fraction, by estimation, is 60  to 65%. The left ventricle has normal function. The left ventricle has no  regional wall motion abnormalities. The left ventricular internal cavity size was normal in size. There is mild left ventricular hypertrophy. Left ventricular diastolic parameters  are consistent with Grade I diastolic dysfunction (impaired relaxation). Right Ventricle: The right ventricular size is normal. Right ventricular systolic function is normal. Left Atrium: Left atrial size was normal in size.   LE vasc US 11/14>> RIGHT: - Findings consistent with acute deep vein thrombosis involving the right  femoral vein, right popliteal vein, right posterior tibial veins, and right peroneal veins.   LEFT: - Findings consistent with acute deep vein thrombosis involving the left  femoral vein, left proximal profunda vein, left popliteal vein, left posterior tibial veins, and left peroneal veins.   11/16  Bilateral LE arterial dopplers>>No obvious evidence of hemodynamically significant stenosis with distal dampened of flow.    Micro Data:  COVID and flu 11/13 >> negative MRSA 11/13>negative Blood cultures 11/13 >ng 06/19/2020 sputum>> Antimicrobials:  11/13 vancomycin>> 11/19 11/13 cefepime>>11/19  Interim history/subjective:   Appears somewhat neurologically lighter   Objective   Blood pressure 132/76, pulse (!) 109, temperature 99 F (37.2 C), resp. rate (!) 25, height 6\' 2"  (1.88 m), weight 90.3 kg, SpO2 99 %. CVP:  [2 mmHg-11 mmHg] 5 mmHg  Vent Mode: PSV;CPAP FiO2 (%):  [40 %] 40 % Set Rate:  [14 bmp] 14 bmp Vt Set:  [660 mL] 660 mL PEEP:  [5 cmH20] 5 cmH20 Pressure Support:  [5 cmH20-8 cmH20] 5 cmH20 Plateau Pressure:  [17 cmH20-18 cmH20] 18 cmH20   Intake/Output Summary (Last 24 hours) at 06/21/2020 0855 Last data filed at 06/21/2020 0700 Gross per 24 hour  Intake 2562.92 ml  Output 800 ml  Net 1762.92 ml   Filed Weights   06/18/20 0400 06/19/20 0600 06/20/20 0500  Weight: 90.9 kg 89.4 kg 90.3 kg    Examination: General: 34 year old male who remains primarily unresponsive HEENT: Endotracheal gastric tubes are in place Neuro: Negative doll's eyes.  Gag reflex positive blink does not follow commands CV: Heart sounds are regular rhythm PULM: Clear to auscultation Currently on pressure support of 5 PEEP of 540% with spontaneous rate of 20 spontaneous tidal volume of 900 cc. GI: soft, bsx4 active  GU: No urine Extremities: Lower extremities are intermittently cool.  Bilateral lower extremity ulcers are noted Skin: Apparent pressure ulcers on bilateral lower extremities this may be effective recent cellulitis and treatment.  Left lower ext  Left heel  Rt lower ext     Resolved Hospital Problem list   Hyperkalemia  Hypernatremia  Thrombocytopenia, Coagulopathy  Assessment & Plan:    Acute metabolic encephalopathy. multifactorial (uremia, anoxic  brain injury, metabolic derangements) c/b concern for cerebellum infarct  -Found unresponsive by family after approximately 1 week downtime; felt consequence of overdose, UDS positive for amphetamines  -Brain MRI obtained overnight 11/14 with diffuse white matter abnormality most consistent with hypoxic/ischemic injury  P: Prognosis remains grim.  He is somewhat neurologically lighter than previous days. Continue to monitor and provide supportive care currently day 4 off of sedating medications     Acute hypoxemic respiratory failure due to inability to protect airway -In the setting of prolonged downtime and acute uremic encephalopathy.   P:  Currently on 5/5 pressure support with spontaneous respiratory rate of 20 with a spontaneous tidal volume of 900 cc.  He can adequately ventilate but did not know if he can protect his airway at this time.    Acute renal failure likely ATN, Rhabdo and volume depletion  CRRT started 11/14,acid base improved. Hyperkalemia resolved P: Per nephrology Resume intermittent hemodialysis per nephrology 02/19/2020   Circulatory shock  Lactic acidosis resolved Empirical antimicrobial therapy  P: Weaned off Levophed #7 cefepime/vancomycin no + culture data.  Stop vancomycin 06/21/2020 DC cefepime 06/21/2020 We can resume antimicrobial therapy should be acute indication of further infection.   BLE acute DVT Bilateral pressure ulcers Concern for decreased LE pulses earlier, arterial duplex without stenosis or decreased flow RV function okay so doubt PE P: Continue IV heparin Lower extremity dressings  Goals of care 11/17 -MD discussed with mom, this is her only child.  She is clear that he would not want tracheostomy and nursing home status if he does not have neurological recovery.  Would continue supportive care to see if there is any more neurological improvement   Best practice:  Diet: tube feeds Pain/Anxiety/Delirium protocol (if  indicated): Pad protocol, RASS goal 0 effective 11/15 VAP protocol (if indicated): In place DVT prophylaxis: Therapeutic heparin started 11/14 for bilateral DVT GI prophylaxis: PPI Glucose control: SSI Mobility: Bedrest Code Status: Full code Family Communication: Mother updated daily.  She does not want tracheostomy at this time. Disposition: ICU   App cct 34 min  Brett Canales Jonai Weyland ACNP Acute Care Nurse Practitioner [facility] Pulmonary/Critical Care Please consult Amion 06/21/2020, 8:55 AM

## 2020-06-21 NOTE — Progress Notes (Addendum)
[facility] ROUNDING NOTE   Subjective:   Brief history This is a 34 year old gentleman with a history of ADHD brought to the emergency room after being found down on the floor with minimal responsiveness in the GCS of 3.  He had acute kidney injury with BUN 315 creatinine 17 and potassium greater than 7.5.  The etiology of his acute kidney injury was thought to be ATN in the setting of severe dehydration hypotension and rhabdomyolysis.  His urinalysis is without protein.  His renal ultrasound showed increased echogenicity.  CRRT has been initiated.  Oliguric.  CRRT was discontinued 06/19/2020  Subjective Some eye-opening.  Continue aggressive care.  Family do not wish to have trach a feeding tube placed  Blood pressure 142/75 pulse 107 temperature 99 O2 sats 100% FiO2 40%  IV Levophed, IV heparin IV cefepime IV vancomycin  Sodium 133 potassium 5.9 chloride 102 CO2 17 BUN 151 creatinine 5 glucose 111 calcium 8.4 phosphorus 7.1 magnesium 2.8 albumin 1.8 hemoglobin 10.2 WBC 25.7   Objective:  Vital signs in last 24 hours:  Temp:  [99.1 F (37.3 C)-100 F (37.8 C)] 99.1 F (37.3 C) (11/19 0500) Pulse Rate:  [94-110] 104 (11/19 0500) Resp:  [16-29] 20 (11/19 0500) BP: (111-149)/(60-82) 149/70 (11/19 0500) SpO2:  [99 %-100 %] 99 % (11/19 0500) Arterial Line BP: (89-164)/(58-77) 164/71 (11/19 0500) FiO2 (%):  [40 %] 40 % (11/19 0400)  Weight change:  Filed Weights   06/18/20 0400 06/19/20 0600 06/20/20 0500  Weight: 90.9 kg 89.4 kg 90.3 kg    Intake/Output: I/O last 3 completed shifts: In: 3755.5 [I.V.:834.8; NG/GT:2540; IV Piggyback:380.7] Out: 937 [Urine:350; Other:453]   Intake/Output this shift:  Total I/O In: 1148.5 [I.V.:243.5; NG/GT:905] Out: 520 [Urine:520]  General: Critically ill looking male with dry mucous membrane, sedated, intubated Heart:RRR, s1s2 nl Lungs: Coarse breath sound bilateral Abdomen:soft, Non-tender, non-distended Extremities:  Multiple necrotic skin changes in lower extremity Dialysis Access: Right femoral temporary HD catheter placed by ICU on 11/13.   Basic Metabolic Panel: Recent Labs  Lab 06/18/20 0218 06/18/20 0218 06/18/20 1655 06/18/20 1655 06/19/20 0430 06/19/20 0430 06/19/20 1654 06/19/20 1654 06/20/20 0351 06/20/20 1652 06/21/20 0404  NA 135   < > 134*   < > 134*  --  133*  --  134* 132* 133*  K 4.9   < > 4.8   < > 4.5  --  4.9  --  5.1 5.8* 5.9*  CL 103   < > 103   < > 104  --  103  --  102 99 102  CO2 21*   < > 19*   < > 21*  --  18*  --  18* 18* 17*  GLUCOSE 115*   < > 135*   < > 103*  --  122*  --  124* 107* 111*  BUN 49*   < > 48*   < > 50*  --  62*  --  94* 127* 151*  CREATININE 2.07*   < > 1.73*   < > 1.76*  --  2.17*  --  3.35* 4.39* 5.01*  CALCIUM 8.7*   < > 8.4*   < > 8.3*   < > 8.2*   < > 8.3* 8.2* 8.4*  MG 2.7*  --  2.9*  --  2.6*  --   --   --  2.8*  --  2.8*  PHOS 4.3   < > 3.9   < > 3.2  --  3.7  --  4.8* 5.9* 7.1*   < > = values in this interval not displayed.    Liver Function Tests: Recent Labs  Lab 06/26/2020 1120 06/16/20 0420 06/19/20 0430 06/19/20 1654 06/20/20 0351 06/20/20 1652 06/21/20 0404  AST 41  --   --   --   --   --   --   ALT 184*  --   --   --   --   --   --   ALKPHOS 55  --   --   --   --   --   --   BILITOT 1.9*  --   --   --   --   --   --   PROT 6.4*  --   --   --   --   --   --   ALBUMIN 3.1*   < > 2.1* 1.9* 1.9* 1.8* 1.8*   < > = values in this interval not displayed.   No results for input(s): LIPASE, AMYLASE in the last 168 hours. Recent Labs  Lab 07/02/2020 1831  AMMONIA 41*    CBC: Recent Labs  Lab 07/02/2020 1120 06/30/2020 1141 06/17/20 0305 06/18/20 0218 06/19/20 0430 06/20/20 0351 06/21/20 0404  WBC 14.5*   < > 21.5* 24.0* 30.3* 30.1* 25.7*  NEUTROABS 12.5*  --   --   --   --   --   --   HGB 16.1   < > 14.8 13.9 12.4* 11.4* 10.2*  HCT 50.7   < > 44.6 41.0 37.8* 35.1* 31.4*  MCV 98.8   < > 92.5 93.0 94.3 93.9 94.3  PLT  66*   < > 65* 105* 148* 213 259   < > = values in this interval not displayed.    Cardiac Enzymes: Recent Labs  Lab 06/20/2020 1120 06/16/20 1405 06/17/20 0547  CKTOTAL 2,353* 3,990* 3,896*  CKMB  --  36.1*  --     BNP: Invalid input(s): POCBNP  CBG: Recent Labs  Lab 06/20/20 1952 06/20/20 2028 06/20/20 2322 06/21/20 0114 06/21/20 0404  GLUCAP 103* 108* 106* 113* 19    Microbiology: Results for orders placed or performed during the hospital encounter of 06/10/2020  Respiratory Panel by RT PCR (Flu A&B, Covid) - Nasopharyngeal Swab     Status: None   Collection Time: 06/26/2020 10:41 AM   Specimen: Nasopharyngeal Swab  Result Value Ref Range Status   SARS Coronavirus 2 by RT PCR NEGATIVE NEGATIVE Final    Comment: (NOTE) SARS-CoV-2 target nucleic acids are NOT DETECTED.  The SARS-CoV-2 RNA is generally detectable in upper respiratoy specimens during the acute phase of infection. The lowest concentration of SARS-CoV-2 viral copies this assay can detect is 131 copies/mL. A negative result does not preclude SARS-Cov-2 infection and should not be used as the sole basis for treatment or other patient management decisions. A negative result may occur with  improper specimen collection/handling, submission of specimen other than nasopharyngeal swab, presence of viral mutation(s) within the areas targeted by this assay, and inadequate number of viral copies (<131 copies/mL). A negative result must be combined with clinical observations, patient history, and epidemiological information. The expected result is Negative.  Fact Sheet for Patients:  PinkCheek.be  Fact Sheet for Healthcare Providers:  GravelBags.it  This test is no t yet approved or cleared by the [place] FDA and  has been authorized for detection and/or diagnosis of SARS-CoV-2 by FDA under an Emergency Use Authorization (EUA).  This EUA will remain   in effect (meaning this test can be used) for the duration of the COVID-19 declaration under Section 564(b)(1) of the Act, 21 U.S.C. section 360bbb-3(b)(1), unless the authorization is terminated or revoked sooner.     Influenza A by PCR NEGATIVE NEGATIVE Final   Influenza B by PCR NEGATIVE NEGATIVE Final    Comment: (NOTE) The Xpert Xpress SARS-CoV-2/FLU/RSV assay is intended as an aid in  the diagnosis of influenza from Nasopharyngeal swab specimens and  should not be used as a sole basis for treatment. Nasal washings and  aspirates are unacceptable for Xpert Xpress SARS-CoV-2/FLU/RSV  testing.  Fact Sheet for Patients: PinkCheek.be  Fact Sheet for Healthcare Providers: GravelBags.it  This test is not yet approved or cleared by the [place] FDA and  has been authorized for detection and/or diagnosis of SARS-CoV-2 by  FDA under an Emergency Use Authorization (EUA). This EUA will remain  in effect (meaning this test can be used) for the duration of the  Covid-19 declaration under Section 564(b)(1) of the Act, 21  U.S.C. section 360bbb-3(b)(1), unless the authorization is  terminated or revoked. Performed at [facility], [place] [address], [place], [place] [address]   Blood culture (routine x 2)     Status: None   Collection Time: 06/25/2020 11:20 AM   Specimen: BLOOD LEFT ARM  Result Value Ref Range Status   Specimen Description BLOOD LEFT ARM  Final   Special Requests   Final    BOTTLES DRAWN AEROBIC AND ANAEROBIC Blood Culture adequate volume   Culture   Final    NO GROWTH 5 DAYS Performed at [facility], [address], [place], [place] [address]    Report Status 06/20/2020 FINAL  Final  MRSA PCR Screening     Status: None   Collection Time: 06/08/2020  5:57 PM   Specimen: Nasal Mucosa; Nasopharyngeal  Result Value Ref Range Status   MRSA by PCR NEGATIVE NEGATIVE Final    Comment:        The  GeneXpert MRSA Assay (FDA approved for NASAL specimens only), is one component of a comprehensive MRSA colonization surveillance program. It is not intended to diagnose MRSA infection nor to guide or monitor treatment for MRSA infections. Performed at [facility], [place] [address] [place], [place] [address]   Culture, respiratory (non-expectorated)     Status: None (Preliminary result)   Collection Time: 06/19/20  8:57 AM   Specimen: Tracheal Aspirate; Respiratory  Result Value Ref Range Status   Specimen Description TRACHEAL ASPIRATE  Final   Special Requests NONE  Final   Gram Stain   Final    RARE WBC PRESENT,BOTH PMN AND MONONUCLEAR RARE YEAST    Culture   Final    CULTURE REINCUBATED FOR BETTER GROWTH Performed at [facility], [place] [address] [place], [place] [address]    Report Status PENDING  Incomplete  Culture, respiratory (non-expectorated)     Status: None (Preliminary result)   Collection Time: 06/20/20 12:55 AM   Specimen: Tracheal Aspirate; Respiratory  Result Value Ref Range Status   Specimen Description TRACHEAL ASPIRATE  Final   Special Requests NONE  Final   Gram Stain   Final    ABUNDANT WBC PRESENT, PREDOMINANTLY PMN MODERATE YEAST Performed at [facility], [address], [place], [place] [address]    Culture PENDING  Incomplete   Report Status PENDING  Incomplete  Culture, blood (routine  x 2)     Status: None (Preliminary result)   Collection Time: 06/20/20  3:51 AM   Specimen: BLOOD LEFT ARM  Result Value Ref Range Status   Specimen Description BLOOD LEFT ARM  Final   Special Requests   Final    BOTTLES DRAWN AEROBIC AND ANAEROBIC Blood Culture adequate volume   Culture   Final    NO GROWTH < 12 HOURS Performed at [facility], [place] [address], [place], [place] [address]    Report Status PENDING  Incomplete  Culture, blood (routine x 2)     Status: None (Preliminary result)   Collection Time: 06/20/20  4:02 AM    Specimen: BLOOD LEFT HAND  Result Value Ref Range Status   Specimen Description BLOOD LEFT HAND  Final   Special Requests   Final    BOTTLES DRAWN AEROBIC AND ANAEROBIC Blood Culture adequate volume   Culture   Final    NO GROWTH < 12 HOURS Performed at [facility], [place] [address] [place], [place] [address]    Report Status PENDING  Incomplete    Coagulation Studies: Recent Labs    06/19/20 0430 06/20/20 0351 06/21/20 0404  LABPROT 14.2 15.0 14.9  INR 1.1 1.2 1.2    Urinalysis: No results for input(s): COLORURINE, LABSPEC, PHURINE, GLUCOSEU, HGBUR, BILIRUBINUR, KETONESUR, PROTEINUR, UROBILINOGEN, NITRITE, LEUKOCYTESUR in the last 72 hours.  Invalid input(s): APPERANCEUR    Imaging: DG Chest Port 1 View  Result Date: 06/20/2020 CLINICAL DATA:  Respiration abnormal.  Endotracheal tube present. EXAM: PORTABLE CHEST 1 VIEW COMPARISON:  06/19/2020 FINDINGS: Endotracheal tube is 5.3 cm above the carina. Nasogastric tube extends into the abdomen but the tip is beyond the image. Stable apical lung scarring. Otherwise, the lungs are clear. Negative for a pneumothorax. Heart size is within normal limits and stable. Left jugular central line tip there is near the upper SVC. IMPRESSION: 1. Stable chest radiograph findings.  No focal lung disease. 2. Support apparatuses as described. Electronically Signed   By: Markus Daft M.D.   On: 06/20/2020 08:18     Medications:   .  prismasol BGK 4/2.5 500 mL/hr at 06/19/20 1216  .  prismasol BGK 4/2.5 500 mL/hr at 06/19/20 1044  . sodium chloride 10 mL/hr at 06/21/20 0500  . ceFEPime (MAXIPIME) IV    . feeding supplement (VITAL AF 1.2 CAL) 70 mL/hr at 06/21/20 0100  . heparin 1,250 Units/hr (06/21/20 0500)  . norepinephrine (LEVOPHED) Adult infusion Stopped (06/20/20 1528)  . prismasol BGK 4/2.5 2,000 mL/hr at 06/19/20 1245   . chlorhexidine gluconate (MEDLINE KIT)  15 mL Mouth Rinse BID  . Chlorhexidine Gluconate Cloth  6 each  Topical Daily  . feeding supplement (PROSource TF)  45 mL Per Tube TID  . insulin aspart  0-9 Units Subcutaneous Q4H  . mouth rinse  15 mL Mouth Rinse 10 times per day  . pantoprazole sodium  40 mg Per Tube Q1200  . sodium chloride flush  10-40 mL Intracatheter Q12H  . sodium zirconium cyclosilicate  5 g Per Tube Once  . vancomycin variable dose per unstable renal function (pharmacist dosing)   Does not apply See admin instructions   sodium chloride, acetaminophen (TYLENOL) oral liquid 160 mg/5 mL, docusate, fentaNYL (SUBLIMAZE) injection, heparin, heparin, midazolam, ondansetron (ZOFRAN) IV, polyethylene glycol, sodium chloride, sodium chloride flush  Assessment/ Plan:  1. Acute kidney injury likely ATN in the setting of severe dehydration/hypotension, rhabdomyolysis after found down on the  floor. UA without proteinuria. Kidney ultrasound consistent with increased echogenicity without acute finding. Started CRRT for multiple electrolytes abnormalities including hyperkalemia.   CRRT discontinued on 06/19/2020.  We shall try intermittent HD after discussion with Critical Care  Dr Dagmar Hait    2.  Hyperkalemia  attempt HD 11/19  3.Unresponsive/acute metabolic encephalopathy: MRI of the brain without hemorrhage or mass-effect however has hypoxic ischemic injury.    EEG revealing diffuse encephalopathy.  Family did not appear to wish for trach and feeding tube placement.  4.Acute respiratory failure with hypoxia: intubated. Manage byPCCM.  5.Hypernatremia: Appears to be resolved    6 Acidosis:  Will attempt HD   Prognosis guarded.  Discussed with ICU team.   LOS: [place] $RemoveBeforeDE'@TODAY'[id number]$ $Remove'@6'oiwiCBW$ :16 AM

## 2020-06-21 NOTE — Progress Notes (Signed)
eLink Physician-Brief Progress Note Patient Name: Derek Jennings DOB: 1986/05/11 MRN: [id number]   Date of Service  06/21/2020  HPI/Events of Note  K+ 5.9  eICU Interventions  Lokelma 5 gm via NG tube x 1        Derek Jennings [name] 06/21/2020, 6:01 AM

## 2020-06-22 ENCOUNTER — Inpatient Hospital Stay (HOSPITAL_COMMUNITY): Payer: Self-pay

## 2020-06-22 LAB — GLUCOSE, CAPILLARY
Glucose-Capillary: 106 mg/dL — ABNORMAL HIGH (ref 70–99)
Glucose-Capillary: 108 mg/dL — ABNORMAL HIGH (ref 70–99)
Glucose-Capillary: 111 mg/dL — ABNORMAL HIGH (ref 70–99)
Glucose-Capillary: 119 mg/dL — ABNORMAL HIGH (ref 70–99)

## 2020-06-22 LAB — PROTIME-INR
INR: 1.2 (ref 0.8–1.2)
Prothrombin Time: 15.1 seconds (ref 11.4–15.2)

## 2020-06-22 LAB — CBC WITH DIFFERENTIAL/PLATELET
Abs Immature Granulocytes: 2.38 10*3/uL — ABNORMAL HIGH (ref 0.00–0.07)
Basophils Absolute: 0.1 10*3/uL (ref 0.0–0.1)
Basophils Relative: 1 %
Eosinophils Absolute: 0.2 10*3/uL (ref 0.0–0.5)
Eosinophils Relative: 1 %
HCT: 28.2 % — ABNORMAL LOW (ref 39.0–52.0)
Hemoglobin: 9.3 g/dL — ABNORMAL LOW (ref 13.0–17.0)
Immature Granulocytes: 12 %
Lymphocytes Relative: 7 %
Lymphs Abs: 1.3 10*3/uL (ref 0.7–4.0)
MCH: 30.9 pg (ref 26.0–34.0)
MCHC: 33 g/dL (ref 30.0–36.0)
MCV: 93.7 fL (ref 80.0–100.0)
Monocytes Absolute: 1.6 10*3/uL — ABNORMAL HIGH (ref 0.1–1.0)
Monocytes Relative: 8 %
Neutro Abs: 13.9 10*3/uL — ABNORMAL HIGH (ref 1.7–7.7)
Neutrophils Relative %: 71 %
Platelets: 279 10*3/uL (ref 150–400)
RBC: 3.01 MIL/uL — ABNORMAL LOW (ref 4.22–5.81)
RDW: 14.3 % (ref 11.5–15.5)
WBC: 19.4 10*3/uL — ABNORMAL HIGH (ref 4.0–10.5)
nRBC: 0.3 % — ABNORMAL HIGH (ref 0.0–0.2)

## 2020-06-22 LAB — RENAL FUNCTION PANEL
Albumin: 1.7 g/dL — ABNORMAL LOW (ref 3.5–5.0)
Albumin: 1.6 g/dL — ABNORMAL LOW (ref 3.5–5.0)
Anion gap: 13 (ref 5–15)
Anion gap: 19 — ABNORMAL HIGH (ref 5–15)
BUN: 122 mg/dL — ABNORMAL HIGH (ref 6–20)
BUN: 159 mg/dL — ABNORMAL HIGH (ref 6–20)
CO2: 21 mmol/L — ABNORMAL LOW (ref 22–32)
CO2: 17 mmol/L — ABNORMAL LOW (ref 22–32)
Calcium: 8.4 mg/dL — ABNORMAL LOW (ref 8.9–10.3)
Calcium: 8.5 mg/dL — ABNORMAL LOW (ref 8.9–10.3)
Chloride: 102 mmol/L (ref 98–111)
Chloride: 99 mmol/L (ref 98–111)
Creatinine, Ser: 4.28 mg/dL — ABNORMAL HIGH (ref 0.61–1.24)
Creatinine, Ser: 5.14 mg/dL — ABNORMAL HIGH (ref 0.61–1.24)
GFR, Estimated: 18 mL/min — ABNORMAL LOW (ref >60)
GFR, Estimated: 14 mL/min — ABNORMAL LOW (ref >60)
Glucose, Bld: 117 mg/dL — ABNORMAL HIGH (ref 70–99)
Glucose, Bld: 105 mg/dL — ABNORMAL HIGH (ref 70–99)
Phosphorus: 7.6 mg/dL — ABNORMAL HIGH (ref 2.5–4.6)
Phosphorus: 9.7 mg/dL — ABNORMAL HIGH (ref 2.5–4.6)
Potassium: 4.8 mmol/L (ref 3.5–5.1)
Potassium: 5.3 mmol/L — ABNORMAL HIGH (ref 3.5–5.1)
Sodium: 136 mmol/L (ref 135–145)
Sodium: 135 mmol/L (ref 135–145)

## 2020-06-22 LAB — CULTURE, RESPIRATORY W GRAM STAIN

## 2020-06-22 LAB — BASIC METABOLIC PANEL
Anion gap: 14 (ref 5–15)
BUN: 122 mg/dL — ABNORMAL HIGH (ref 6–20)
CO2: 21 mmol/L — ABNORMAL LOW (ref 22–32)
Calcium: 8.5 mg/dL — ABNORMAL LOW (ref 8.9–10.3)
Chloride: 102 mmol/L (ref 98–111)
Creatinine, Ser: 4.33 mg/dL — ABNORMAL HIGH (ref 0.61–1.24)
GFR, Estimated: 18 mL/min — ABNORMAL LOW (ref >60)
Glucose, Bld: 117 mg/dL — ABNORMAL HIGH (ref 70–99)
Potassium: 4.8 mmol/L (ref 3.5–5.1)
Sodium: 137 mmol/L (ref 135–145)

## 2020-06-22 LAB — HEPARIN LEVEL (UNFRACTIONATED): Heparin Unfractionated: 0.54 IU/mL (ref 0.30–0.70)

## 2020-06-22 LAB — PHOSPHORUS: Phosphorus: 7.6 mg/dL — ABNORMAL HIGH (ref 2.5–4.6)

## 2020-06-22 MED ORDER — CHLORHEXIDINE GLUCONATE CLOTH 2 % EX PADS
6.0000 | MEDICATED_PAD | Freq: Every day | CUTANEOUS | Status: DC
  Administered 2020-06-22: 6 via TOPICAL

## 2020-06-22 NOTE — Progress Notes (Signed)
ANTICOAGULATION CONSULT NOTE   Pharmacy Consult for heparin Indication: DVT   Patient Measurements: Height: 6\' 2"  (188 cm) Weight: 93.7 kg (206 lb 9.1 oz) IBW/kg (Calculated) : 82.2 Heparin Dosing Weight: 88kg   Labs: Recent Labs    06/20/20 0351 06/20/20 1652 06/21/20 0404 06/21/20 0404 06/21/20 1600 06/22/20 0432 06/22/20 0433  HGB 11.4*  --  10.2*  --   --  9.3*  --   HCT 35.1*  --  31.4*  --   --  28.2*  --   PLT 213  --  259  --   --  279  --   LABPROT 15.0  --  14.9  --   --  15.1  --   INR 1.2  --  1.2  --   --  1.2  --   HEPARINUNFRC 0.29*  --  0.59  --   --   --  0.54  CREATININE 3.35*   < > 5.01*   < > 3.20* 4.33* 4.28*   < > = values in this interval not displayed.    Estimated Creatinine Clearance: 28.8 mL/min (A) (by C-G formula based on SCr of 4.28 mg/dL (H)).   Assessment: 34 year old male found down for unknown length of time, minimally responsive by family. Originally thought to be in DIC, found to have new large bilateral DVTs noted on dopplers.  Pharmacy dosing heparin.   Heparin level is therapeutic at 0.54, on 1250 units/hr. No s/sx of bleeding or infusion issues.   Goal of Therapy:  Heparin level 0.3-0.7 units/ml Monitor platelets by anticoagulation protocol: Yes   Plan:  - Continue heparin to 1250 units/h - Daily heparin level and CBC - Monitor for s/sx of bleed  36, PharmD, BCCCP Clinical Pharmacist  Phone: [phone] 06/22/2020 8:21 AM  Please check AMION for all [facility] phone numbers After 10:00 PM, call Main Pharmacy [phone]

## 2020-06-22 NOTE — Progress Notes (Signed)
NAMEHoyt Jennings, MRN:  [id number], DOB:  06-28-1986, LOS: 7 ADMISSION DATE:  06/20/20, CONSULTATION DATE: 11/13 REFERRING MD:  Dr. Lynelle Doctor, CHIEF COMPLAINT: Acute renal failure  Brief History   34 year old male presents to ED via EMS after being found minimally responsive by family.  Last seen normal 1 week prior to admission.  Known history of depression and drug abuse.  PCCM consulted for further management and admission   Past Medical History  Depression Drug abuse  Significant Hospital Events   Admitted 11/13 required intubation for airway protection, requiring multiple pressors for hemodynamic support CRRT started 11/14.  Bilateral DVTs on both lower extremities.  IV heparin started.  MRI obtained showing diffuse injury worrisome for an anoxic process 11/17 CRRT stopped, to IHD  Consults:  Nephrology  Procedures:    Significant Diagnostic Tests:  Chest x-ray 11/13 >> Subtle patchy airspace disease at the left base. ECHO 11/13>> Left Ventricle: Left ventricular ejection fraction, by estimation, is 60  to 65%. The left ventricle has normal function. The left ventricle has no  regional wall motion abnormalities. The left ventricular internal cavity size was normal in size. There is mild left ventricular hypertrophy. Left ventricular diastolic parameters  are consistent with Grade I diastolic dysfunction (impaired relaxation). Right Ventricle: The right ventricular size is normal. Right ventricular systolic function is normal. Left Atrium: Left atrial size was normal in size.   LE vasc US 11/14>> RIGHT: - Findings consistent with acute deep vein thrombosis involving the right  femoral vein, right popliteal vein, right posterior tibial veins, and right peroneal veins.   LEFT: - Findings consistent with acute deep vein thrombosis involving the left  femoral vein, left proximal profunda vein, left popliteal vein, left posterior tibial veins, and left peroneal veins.   11/16  Bilateral LE arterial dopplers>>No obvious evidence of hemodynamically significant stenosis with distal dampened of flow.    Micro Data:  COVID and flu 11/13 >> negative MRSA 11/13>negative Blood cultures 11/13 >ng 06/19/2020 sputum>> Antimicrobials:  11/13 vancomycin>> 11/19 11/13 cefepime>>   Interim history/subjective:   No significant changes reported o/n   Objective   Blood pressure 130/79, pulse 84, temperature 99 F (37.2 C), resp. rate 17, height 6\' 2"  (1.88 m), weight 93.7 kg, SpO2 100 %. CVP:  [2 mmHg-14 mmHg] 3 mmHg  Vent Mode: PRVC FiO2 (%):  [40 %] 40 % Set Rate:  [14 bmp] 14 bmp Vt Set:  [660 mL-6610 mL] 6610 mL PEEP:  [5 cmH20] 5 cmH20 Pressure Support:  [5 cmH20] 5 cmH20 Plateau Pressure:  [16 cmH20-19 cmH20] 16 cmH20   Intake/Output Summary (Last 24 hours) at 06/22/2020 1144 Last data filed at 06/22/2020 0700 Gross per 24 hour  Intake 2004.67 ml  Output 1195 ml  Net 809.67 ml   Filed Weights   06/20/20 0500 06/21/20 1040 06/21/20 1425  Weight: 90.3 kg 94.7 kg 93.7 kg    Examination: General: Critically ill-appearing young man, ventilated, unresponsive HEENT: ET tube in place, G-tube in place Neuro: Eyes flicker to voice, pupils equal, doll's eye negative, he does breathe over the set rate on the ventilator, does not wake, does not follow any commands or have any spontaneous movement CV: Regular, distant, no murmur PULM: Clear bilaterally, no wheezing or crackles GI: Nondistended, positive bowel sounds GU: No urine Extremities: Lower extremities cool, pressure ulcers present, see photos below   Left lower ext  Left heel   Rt lower ext     Resolved Hospital Problem list  Hyperkalemia  Hypernatremia  Thrombocytopenia, Coagulopathy  Assessment & Plan:    Acute metabolic and hypoxemic encephalopathy. multifactorial (uremia, anoxic brain injury, metabolic derangements) c/b concern for cerebellum infarct  -Found unresponsive by family  after approximately 1 week downtime; felt consequence of overdose, UDS positive for amphetamines  -Brain MRI obtained overnight 11/14 with diffuse white matter abnormality most consistent with hypoxic/ischemic injury P: -Grim prognosis.  Per discussions with patient's mother by Dr. Vassie Loll plan is to follow neurologically at least through the weekend and then reassess 11/22.  If no significant neurological change off of any sedating medication then we would discuss possible transition to comfort and compassionate extubation.  Acute hypoxemic respiratory failure due to inability to protect airway -In the setting of prolonged downtime and acute uremic encephalopathy.   P: -Tolerates PSV but does not have the mental status to protect airway.  Not a candidate for extubation unless he has a neurological improvement  Acute renal failure likely ATN, Rhabdo and volume depletion  CRRT started 11/14,acid base improved. Hyperkalemia resolved P: -Appreciate nephrology management -Intermittent hemodialysis to maintain stability while we are following neurological status   Circulatory shock  Lactic acidosis resolved Empirical antimicrobial therapy P: Norepinephrine weaned to off -Vancomycin stopped 11/19 -Plan to continue cefepime 10 days total, through 11/22   BLE acute DVT Bilateral pressure ulcers Concern for decreased LE pulses earlier, arterial duplex without stenosis or decreased flow RV function okay so doubt PE P: -IV heparin -Lower extremity wound care, dressing changes  Goals of care 11/17 -Dr. Vassie Loll discussed with mom, this is her only child.  She is clear that he would not want tracheostomy and nursing home status if he does not have neurological recovery.  Would continue supportive care through the weekend to see if there is any more neurological improvement   Best practice:  Diet: tube feeds Pain/Anxiety/Delirium protocol (if indicated): Pad protocol, RASS goal 0 effective  11/15 VAP protocol (if indicated): In place DVT prophylaxis: Therapeutic heparin started 11/14 for bilateral DVT GI prophylaxis: PPI Glucose control: SSI Mobility: Bedrest Code Status: Full code Family Communication: Mother updated by Dr Vassie Loll 11/19 Disposition: ICU   Levy Pupa, MD, PhD 06/22/2020, 11:51 AM [facility] [phone] or if no answer [phone]

## 2020-06-22 NOTE — Progress Notes (Signed)
[facility] ROUNDING NOTE   Subjective:   Brief history This is a 34 year old gentleman with a history of ADHD brought to the emergency room after being found down on the floor with minimal responsiveness in the GCS of 3.  He had acute kidney injury with BUN 315 creatinine 17 and potassium greater than 7.5.  The etiology of his acute kidney injury was thought to be ATN in the setting of severe dehydration hypotension and rhabdomyolysis.  His urinalysis is without protein.  His renal ultrasound showed increased echogenicity.  CRRT has been initiated.  Oliguric.  CRRT was discontinued 06/19/2020.  Underwent successful dialysis 06/21/2020 with 1 L removed.  Subjective Some eye-opening.  Continue aggressive care.  Family do not wish to have trach a feeding tube placed  Blood pressure 180/64 pulse 80 temperature 99 O2 sats 100% FiO2 40%   IV heparin IV cefepime IV vancomycin  Sodium 136 potassium 4.8 chloride 102 CO2 21 BUN 122 creatinine 4.28 glucose 117 phosphorus 7.6 hemoglobin 9.3   Objective:  Vital signs in last 24 hours:  Temp:  [99 F (37.2 C)-100 F (37.8 C)] 99.3 F (37.4 C) (11/20 0700) Pulse Rate:  [84-123] 93 (11/20 0700) Resp:  [15-35] 18 (11/20 0700) BP: (87-155)/(55-96) 134/68 (11/20 0700) SpO2:  [97 %-100 %] 100 % (11/20 0700) Arterial Line BP: (118-179)/(56-72) 179/64 (11/20 0700) FiO2 (%):  [40 %] 40 % (11/20 0408) Weight:  [93.7 kg-94.7 kg] 93.7 kg (11/19 1425)  Weight change:  Filed Weights   06/20/20 0500 06/21/20 1040 06/21/20 1425  Weight: 90.3 kg 94.7 kg 93.7 kg    Intake/Output: I/O last 3 completed shifts: In: 3803.2 [I.V.:818.1; NG/GT:2785; IV Piggyback:200.1] Out: 1950 [Urine:950; Other:1000]   Intake/Output this shift:  No intake/output data recorded.  General: Critically ill looking male with dry mucous membrane, sedated, intubated Heart:RRR, s1s2 nl Lungs: Coarse breath sound bilateral Abdomen:soft, Non-tender,  non-distended Extremities: Multiple necrotic skin changes in lower extremity Dialysis Access: Right femoral temporary HD catheter placed by ICU on 11/13.   Basic Metabolic Panel: Recent Labs  Lab 06/18/20 0218 06/18/20 0218 06/18/20 1655 06/18/20 1655 06/19/20 0430 06/19/20 1654 06/20/20 0351 06/20/20 0351 06/20/20 1652 06/20/20 1652 06/21/20 0404 06/21/20 0404 06/21/20 1600 06/22/20 0432 06/22/20 0433  NA 135   < > 134*   < > 134*   < > 134*   < > 132*  --  133*  --  137 137 136  K 4.9   < > 4.8   < > 4.5   < > 5.1   < > 5.8*  --  5.9*  --  4.2 4.8 4.8  CL 103   < > 103   < > 104   < > 102   < > 99  --  102  --  101 102 102  CO2 21*   < > 19*   < > 21*   < > 18*   < > 18*  --  17*  --  20* 21* 21*  GLUCOSE 115*   < > 135*   < > 103*   < > 124*   < > 107*  --  111*  --  137* 117* 117*  BUN 49*   < > 48*   < > 50*   < > 94*   < > 127*  --  151*  --  85* 122* 122*  CREATININE 2.07*   < > 1.73*   < > 1.76*   < >  3.35*   < > 4.39*  --  5.01*  --  3.20* 4.33* 4.28*  CALCIUM 8.7*   < > 8.4*   < > 8.3*   < > 8.3*   < > 8.2*   < > 8.4*   < > 8.4* 8.5* 8.4*  MG 2.7*  --  2.9*  --  2.6*  --  2.8*  --   --   --  2.8*  --   --   --   --   PHOS 4.3   < > 3.9   < > 3.2   < > 4.8*   < > 5.9*  --  7.1*  --  5.1* 7.6* 7.6*   < > = values in this interval not displayed.    Liver Function Tests: Recent Labs  Lab 06/17/2020 1120 06/16/20 0420 06/20/20 0351 06/20/20 1652 06/21/20 0404 06/21/20 1600 06/22/20 0433  AST 41  --   --   --   --   --   --   ALT 184*  --   --   --   --   --   --   ALKPHOS 55  --   --   --   --   --   --   BILITOT 1.9*  --   --   --   --   --   --   PROT 6.4*  --   --   --   --   --   --   ALBUMIN 3.1*   < > 1.9* 1.8* 1.8* 1.7* 1.7*   < > = values in this interval not displayed.   No results for input(s): LIPASE, AMYLASE in the last 168 hours. Recent Labs  Lab 06/05/2020 1831  AMMONIA 41*    CBC: Recent Labs  Lab 06/16/2020 1120 06/19/2020 1141  06/18/20 0218 06/19/20 0430 06/20/20 0351 06/21/20 0404 06/22/20 0432  WBC 14.5*   < > 24.0* 30.3* 30.1* 25.7* 19.4*  NEUTROABS 12.5*  --   --   --   --   --  13.9*  HGB 16.1   < > 13.9 12.4* 11.4* 10.2* 9.3*  HCT 50.7   < > 41.0 37.8* 35.1* 31.4* 28.2*  MCV 98.8   < > 93.0 94.3 93.9 94.3 93.7  PLT 66*   < > 105* 148* 213 259 279   < > = values in this interval not displayed.    Cardiac Enzymes: Recent Labs  Lab 06/07/2020 1120 06/16/20 1405 06/17/20 0547  CKTOTAL 2,353* 3,990* 3,896*  CKMB  --  36.1*  --     BNP: Invalid input(s): POCBNP  CBG: Recent Labs  Lab 06/21/20 1142 06/21/20 1538 06/21/20 1931 06/21/20 1947 06/22/20 0652  GLUCAP 101* 125* 119* 128* 111*    Microbiology: Results for orders placed or performed during the hospital encounter of 06/13/2020  Respiratory Panel by RT PCR (Flu A&B, Covid) - Nasopharyngeal Swab     Status: None   Collection Time: 06/18/2020 10:41 AM   Specimen: Nasopharyngeal Swab  Result Value Ref Range Status   SARS Coronavirus 2 by RT PCR NEGATIVE NEGATIVE Final    Comment: (NOTE) SARS-CoV-2 target nucleic acids are NOT DETECTED.  The SARS-CoV-2 RNA is generally detectable in upper respiratoy specimens during the acute phase of infection. The lowest concentration of SARS-CoV-2 viral copies this assay can detect is 131 copies/mL. A negative result does not preclude SARS-Cov-2 infection and should not be used as the sole  basis for treatment or other patient management decisions. A negative result may occur with  improper specimen collection/handling, submission of specimen other than nasopharyngeal swab, presence of viral mutation(s) within the areas targeted by this assay, and inadequate number of viral copies (<131 copies/mL). A negative result must be combined with clinical observations, patient history, and epidemiological information. The expected result is Negative.  Fact Sheet for Patients:   PinkCheek.be  Fact Sheet for Healthcare Providers:  GravelBags.it  This test is no t yet approved or cleared by the [place] FDA and  has been authorized for detection and/or diagnosis of SARS-CoV-2 by FDA under an Emergency Use Authorization (EUA). This EUA will remain  in effect (meaning this test can be used) for the duration of the COVID-19 declaration under Section 564(b)(1) of the Act, 21 U.S.C. section 360bbb-3(b)(1), unless the authorization is terminated or revoked sooner.     Influenza A by PCR NEGATIVE NEGATIVE Final   Influenza B by PCR NEGATIVE NEGATIVE Final    Comment: (NOTE) The Xpert Xpress SARS-CoV-2/FLU/RSV assay is intended as an aid in  the diagnosis of influenza from Nasopharyngeal swab specimens and  should not be used as a sole basis for treatment. Nasal washings and  aspirates are unacceptable for Xpert Xpress SARS-CoV-2/FLU/RSV  testing.  Fact Sheet for Patients: PinkCheek.be  Fact Sheet for Healthcare Providers: GravelBags.it  This test is not yet approved or cleared by the [place] FDA and  has been authorized for detection and/or diagnosis of SARS-CoV-2 by  FDA under an Emergency Use Authorization (EUA). This EUA will remain  in effect (meaning this test can be used) for the duration of the  Covid-19 declaration under Section 564(b)(1) of the Act, 21  U.S.C. section 360bbb-3(b)(1), unless the authorization is  terminated or revoked. Performed at [facility], [place] [address], [place], [place] [address]   Blood culture (routine x 2)     Status: None   Collection Time: 06/30/2020 11:20 AM   Specimen: BLOOD LEFT ARM  Result Value Ref Range Status   Specimen Description BLOOD LEFT ARM  Final   Special Requests   Final    BOTTLES DRAWN AEROBIC AND ANAEROBIC Blood Culture adequate volume   Culture   Final    NO GROWTH 5  DAYS Performed at [facility], [address], [place], [place] [address]    Report Status 06/20/2020 FINAL  Final  MRSA PCR Screening     Status: None   Collection Time: 06/05/2020  5:57 PM   Specimen: Nasal Mucosa; Nasopharyngeal  Result Value Ref Range Status   MRSA by PCR NEGATIVE NEGATIVE Final    Comment:        The GeneXpert MRSA Assay (FDA approved for NASAL specimens only), is one component of a comprehensive MRSA colonization surveillance program. It is not intended to diagnose MRSA infection nor to guide or monitor treatment for MRSA infections. Performed at [facility], [place] [address], [place], [place] [address]   Culture, respiratory (non-expectorated)     Status: None (Preliminary result)   Collection Time: 06/19/20  8:57 AM   Specimen: Tracheal Aspirate; Respiratory  Result Value Ref Range Status   Specimen Description TRACHEAL ASPIRATE  Final   Special Requests NONE  Final   Gram Stain   Final    RARE WBC PRESENT,BOTH PMN AND MONONUCLEAR RARE YEAST    Culture   Final    CULTURE REINCUBATED FOR BETTER GROWTH Performed at [facility]  [facility], [place] [address] [place], [place] [address]    Report Status PENDING  Incomplete  Culture, respiratory (non-expectorated)     Status: None (Preliminary result)   Collection Time: 06/20/20 12:55 AM   Specimen: Tracheal Aspirate; Respiratory  Result Value Ref Range Status   Specimen Description TRACHEAL ASPIRATE  Final   Special Requests NONE  Final   Gram Stain   Final    ABUNDANT WBC PRESENT, PREDOMINANTLY PMN MODERATE YEAST    Culture   Final    CULTURE REINCUBATED FOR BETTER GROWTH Performed at [facility], [address], [place], [place] [address]    Report Status PENDING  Incomplete  Culture, blood (routine x 2)     Status: None (Preliminary result)   Collection Time: 06/20/20  3:51 AM   Specimen: BLOOD LEFT ARM  Result Value Ref Range Status   Specimen Description BLOOD LEFT ARM  Final    Special Requests   Final    BOTTLES DRAWN AEROBIC AND ANAEROBIC Blood Culture adequate volume   Culture   Final    NO GROWTH 1 DAY Performed at [facility], [place] [address], [place], [place] [address]    Report Status PENDING  Incomplete  Culture, blood (routine x 2)     Status: None (Preliminary result)   Collection Time: 06/20/20  4:02 AM   Specimen: BLOOD LEFT HAND  Result Value Ref Range Status   Specimen Description BLOOD LEFT HAND  Final   Special Requests   Final    BOTTLES DRAWN AEROBIC AND ANAEROBIC Blood Culture adequate volume   Culture   Final    NO GROWTH 1 DAY Performed at [facility], [place] [address] [place], [place] [address]    Report Status PENDING  Incomplete    Coagulation Studies: Recent Labs    06/20/20 0351 06/21/20 0404 06/22/20 0432  LABPROT 15.0 14.9 15.1  INR 1.2 1.2 1.2    Urinalysis: No results for input(s): COLORURINE, LABSPEC, PHURINE, GLUCOSEU, HGBUR, BILIRUBINUR, KETONESUR, PROTEINUR, UROBILINOGEN, NITRITE, LEUKOCYTESUR in the last 72 hours.  Invalid input(s): APPERANCEUR    Imaging: No results found.   Medications:   . sodium chloride 10 mL/hr at 06/22/20 0700  . sodium chloride    . sodium chloride    . ceFEPime (MAXIPIME) IV Stopped (06/21/20 2320)  . feeding supplement (VITAL AF 1.2 CAL) 1,000 mL (06/22/20 0259)  . heparin 1,250 Units/hr (06/22/20 0700)  . norepinephrine (LEVOPHED) Adult infusion Stopped (06/20/20 1528)   . chlorhexidine gluconate (MEDLINE KIT)  15 mL Mouth Rinse BID  . Chlorhexidine Gluconate Cloth  6 each Topical Q0600  . feeding supplement (PROSource TF)  45 mL Per Tube TID  . mouth rinse  15 mL Mouth Rinse 10 times per day  . pantoprazole sodium  40 mg Per Tube Q1200  . sodium chloride flush  10-40 mL Intracatheter Q12H   sodium chloride, sodium chloride, sodium chloride, acetaminophen (TYLENOL) oral liquid 160 mg/5 mL, alteplase, docusate, fentaNYL (SUBLIMAZE) injection, heparin,  lidocaine (PF), lidocaine-prilocaine, ondansetron (ZOFRAN) IV, pentafluoroprop-tetrafluoroeth, polyethylene glycol, sodium chloride flush  Assessment/ Plan:  1. Acute kidney injury likely ATN in the setting of severe dehydration/hypotension, rhabdomyolysis after found down on the floor. UA without proteinuria. Kidney ultrasound consistent with increased echogenicity without acute finding. Started CRRT for multiple electrolytes abnormalities including hyperkalemia.   CRRT discontinued on 06/19/2020.  Underwent dialysis 06/21/2020.  We will plan next dialysis treatment 06/23/2020    2.  Hyperkalemia resolved with dialysis 06/21/2020  3.Unresponsive/acute metabolic encephalopathy: MRI of the brain without hemorrhage or mass-effect however has hypoxic ischemic injury.    EEG revealing diffuse encephalopathy.  Family did not appear to wish for trach and feeding tube placement.  4.Acute respiratory failure with hypoxia: intubated. Manage byPCCM.  5.Hypernatremia: Appears to be resolved    6 Acidosis: Appeared to tolerate dialysis 06/21/2020 is now off pressors  Prognosis guarded.  Discussed with ICU team.   LOS: [place] _0 _1 :22 AM

## 2020-06-23 LAB — RENAL FUNCTION PANEL
Albumin: 1.7 g/dL — ABNORMAL LOW (ref 3.5–5.0)
Albumin: 1.7 g/dL — ABNORMAL LOW (ref 3.5–5.0)
Albumin: 1.8 g/dL — ABNORMAL LOW (ref 3.5–5.0)
Anion gap: 20 — ABNORMAL HIGH (ref 5–15)
Anion gap: 20 — ABNORMAL HIGH (ref 5–15)
Anion gap: 18 — ABNORMAL HIGH (ref 5–15)
BUN: 172 mg/dL — ABNORMAL HIGH (ref 6–20)
BUN: 197 mg/dL — ABNORMAL HIGH (ref 6–20)
BUN: 127 mg/dL — ABNORMAL HIGH (ref 6–20)
CO2: 16 mmol/L — ABNORMAL LOW (ref 22–32)
CO2: 15 mmol/L — ABNORMAL LOW (ref 22–32)
CO2: 20 mmol/L — ABNORMAL LOW (ref 22–32)
Calcium: 8.6 mg/dL — ABNORMAL LOW (ref 8.9–10.3)
Calcium: 8.3 mg/dL — ABNORMAL LOW (ref 8.9–10.3)
Calcium: 8.6 mg/dL — ABNORMAL LOW (ref 8.9–10.3)
Chloride: 98 mmol/L (ref 98–111)
Chloride: 97 mmol/L — ABNORMAL LOW (ref 98–111)
Chloride: 98 mmol/L (ref 98–111)
Creatinine, Ser: 5.28 mg/dL — ABNORMAL HIGH (ref 0.61–1.24)
Creatinine, Ser: 5.95 mg/dL — ABNORMAL HIGH (ref 0.61–1.24)
Creatinine, Ser: 4.23 mg/dL — ABNORMAL HIGH (ref 0.61–1.24)
GFR, Estimated: 14 mL/min — ABNORMAL LOW (ref >60)
GFR, Estimated: 12 mL/min — ABNORMAL LOW (ref >60)
GFR, Estimated: 18 mL/min — ABNORMAL LOW (ref >60)
Glucose, Bld: 108 mg/dL — ABNORMAL HIGH (ref 70–99)
Glucose, Bld: 112 mg/dL — ABNORMAL HIGH (ref 70–99)
Glucose, Bld: 125 mg/dL — ABNORMAL HIGH (ref 70–99)
Phosphorus: 10.1 mg/dL — ABNORMAL HIGH (ref 2.5–4.6)
Phosphorus: 11.2 mg/dL — ABNORMAL HIGH (ref 2.5–4.6)
Phosphorus: 6.8 mg/dL — ABNORMAL HIGH (ref 2.5–4.6)
Potassium: 5.6 mmol/L — ABNORMAL HIGH (ref 3.5–5.1)
Potassium: 5.5 mmol/L — ABNORMAL HIGH (ref 3.5–5.1)
Potassium: 4.7 mmol/L (ref 3.5–5.1)
Sodium: 134 mmol/L — ABNORMAL LOW (ref 135–145)
Sodium: 132 mmol/L — ABNORMAL LOW (ref 135–145)
Sodium: 136 mmol/L (ref 135–145)

## 2020-06-23 LAB — HEPARIN LEVEL (UNFRACTIONATED)
Heparin Unfractionated: 0.62 IU/mL (ref 0.30–0.70)
Heparin Unfractionated: 0.5 IU/mL (ref 0.30–0.70)

## 2020-06-23 LAB — CBC
HCT: 27.6 % — ABNORMAL LOW (ref 39.0–52.0)
Hemoglobin: 9 g/dL — ABNORMAL LOW (ref 13.0–17.0)
MCH: 30.5 pg (ref 26.0–34.0)
MCHC: 32.6 g/dL (ref 30.0–36.0)
MCV: 93.6 fL (ref 80.0–100.0)
Platelets: 301 10*3/uL (ref 150–400)
RBC: 2.95 MIL/uL — ABNORMAL LOW (ref 4.22–5.81)
RDW: 14.7 % (ref 11.5–15.5)
WBC: 14.2 10*3/uL — ABNORMAL HIGH (ref 4.0–10.5)
nRBC: 0 % (ref 0.0–0.2)

## 2020-06-23 LAB — PROTIME-INR
INR: 1.2 (ref 0.8–1.2)
Prothrombin Time: 14.7 seconds (ref 11.4–15.2)

## 2020-06-23 LAB — MAGNESIUM: Magnesium: 2.4 mg/dL (ref 1.7–2.4)

## 2020-06-23 LAB — GLUCOSE, CAPILLARY: Glucose-Capillary: 110 mg/dL — ABNORMAL HIGH (ref 70–99)

## 2020-06-23 MED ORDER — SODIUM ZIRCONIUM CYCLOSILICATE 5 G PO PACK
5.0000 g | PACK | Freq: Once | ORAL | Status: AC
  Administered 2020-06-23: 5 g
  Filled 2020-06-23: qty 1

## 2020-06-23 MED ORDER — LIDOCAINE HCL (PF) 1 % IJ SOLN: 5.0000 mL | INTRAMUSCULAR | Status: DC | PRN

## 2020-06-23 MED ORDER — HEPARIN SODIUM (PORCINE) 1000 UNIT/ML DIALYSIS: 1000.0000 [IU] | INTRAMUSCULAR | Status: DC | PRN

## 2020-06-23 MED ORDER — ALTEPLASE 2 MG IJ SOLR: 2.0000 mg | Freq: Once | INTRAMUSCULAR | Status: DC | PRN

## 2020-06-23 MED ORDER — CHLORHEXIDINE GLUCONATE CLOTH 2 % EX PADS: 6.0000 | MEDICATED_PAD | Freq: Every day | CUTANEOUS | Status: DC

## 2020-06-23 MED ORDER — HEPARIN SODIUM (PORCINE) 1000 UNIT/ML DIALYSIS
1000.0000 [IU] | INTRAMUSCULAR | Status: DC | PRN
  Filled 2020-06-23: qty 1

## 2020-06-23 MED ORDER — SODIUM CHLORIDE 0.9 % IV SOLN: 100.0000 mL | INTRAVENOUS | Status: DC | PRN

## 2020-06-23 MED ORDER — LIDOCAINE-PRILOCAINE 2.5-2.5 % EX CREA
1.0000 "application " | TOPICAL_CREAM | CUTANEOUS | Status: DC | PRN
  Filled 2020-06-23: qty 5

## 2020-06-23 MED ORDER — PENTAFLUOROPROP-TETRAFLUOROETH EX AERO: 1.0000 "application " | INHALATION_SPRAY | CUTANEOUS | Status: DC | PRN

## 2020-06-23 NOTE — Progress Notes (Signed)
NAME:  Derek Jennings, MRN:  [id number], DOB:  [birth year], LOS: 8 ADMISSION DATE:  06/04/2020, CONSULTATION DATE: 11/13 REFERRING MD:  Dr. Lynelle Doctor, CHIEF COMPLAINT: Acute renal failure  Brief History   34 year old male presents to ED via EMS after being found minimally responsive by family.  Last seen normal 1 week prior to admission.  Known history of depression and drug abuse.  PCCM consulted for further management and admission   Past Medical History  Depression Drug abuse  Significant Hospital Events   Admitted 11/13 required intubation for airway protection, requiring multiple pressors for hemodynamic support CRRT started 11/14.  Bilateral DVTs on both lower extremities.  IV heparin started.  MRI obtained showing diffuse injury worrisome for an anoxic process 11/17 CRRT stopped, to IHD  Consults:  Nephrology  Procedures:    Significant Diagnostic Tests:  Chest x-ray 11/13 >> Subtle patchy airspace disease at the left base. ECHO 11/13>> Left Ventricle: Left ventricular ejection fraction, by estimation, is 60  to 65%. The left ventricle has normal function. The left ventricle has no  regional wall motion abnormalities. The left ventricular internal cavity size was normal in size. There is mild left ventricular hypertrophy. Left ventricular diastolic parameters  are consistent with Grade I diastolic dysfunction (impaired relaxation). Right Ventricle: The right ventricular size is normal. Right ventricular systolic function is normal. Left Atrium: Left atrial size was normal in size.   LE vasc US 11/14>> RIGHT: - Findings consistent with acute deep vein thrombosis involving the right  femoral vein, right popliteal vein, right posterior tibial veins, and right peroneal veins.   LEFT: - Findings consistent with acute deep vein thrombosis involving the left  femoral vein, left proximal profunda vein, left popliteal vein, left posterior tibial veins, and left peroneal veins.   11/16  Bilateral LE arterial dopplers>>No obvious evidence of hemodynamically significant stenosis with distal dampened of flow.    Micro Data:  COVID and flu 11/13 >> negative MRSA 11/13>negative Blood cultures 11/13 >ng 06/19/2020 sputum>> Antimicrobials:  11/13 vancomycin>> 11/19 11/13 cefepime>>   Interim history/subjective:   No interval change reported Did PSV yesterday 11/20 CBGs have been 110s consistently, no insulin ordered   Objective   Blood pressure (!) 144/83, pulse (!) 103, temperature 99 F (37.2 C), resp. rate (!) 24, height 6\' 2"  (1.88 m), weight 92.3 kg, SpO2 100 %. CVP:  [3 mmHg-14 mmHg] 7 mmHg  Vent Mode: PRVC FiO2 (%):  [40 %] 40 % Set Rate:  [14 bmp] 14 bmp Vt Set:  [650 mL-660 mL] 650 mL PEEP:  [5 cmH20] 5 cmH20 Plateau Pressure:  [6 cmH20-15 cmH20] 6 cmH20   Intake/Output Summary (Last 24 hours) at 06/23/2020 0813 Last data filed at 06/23/2020 0700 Gross per 24 hour  Intake 2343.39 ml  Output 535 ml  Net 1808.39 ml   Filed Weights   06/21/20 1040 06/21/20 1425 06/23/20 0430  Weight: 94.7 kg 93.7 kg 92.3 kg    Examination: General: Critically ill young man, ventilated, unresponsive HEENT: ET tube in place, G-tube in place Neuro: Eyes are spontaneously open this morning, pupils equal.  He does not track, does not respond to voice, stimulus or follow commands.  He does have spontaneous respiratory effort CV: Regular, distant, no murmur PULM: Clear bilaterally GI: Nondistended, positive bowel sounds GU: No urine output Extremities: 1+ bilateral upper and lower extremity edema.  Lower extremity pressure wounds as documented in previous photos below   Left lower ext  Left heel   Rt  lower ext     Resolved Hospital Problem list   Hyperkalemia  Hypernatremia  Thrombocytopenia, Coagulopathy  Assessment & Plan:    Acute metabolic and hypoxemic encephalopathy. multifactorial (uremia, anoxic brain injury, metabolic derangements) c/b concern  for cerebellum infarct  -Found unresponsive by family after approximately 1 week downtime; felt consequence of overdose, UDS positive for amphetamines  -Brain MRI obtained overnight 11/14 with diffuse white matter abnormality most consistent with hypoxic/ischemic injury P: -Grim prognosis for neurological recovery.  Discussions with patient's mother by Dr. Vassie Loll confirmed plan to continue support, follow neuro status through the weekend and then reassess on 11/22.  If no significant neurological change off any sedating medications then we would discuss possible transition to comfort and a compassionate extubation  Acute hypoxemic respiratory failure due to inability to protect airway -In the setting of prolonged downtime and acute uremic encephalopathy.   P: -Tolerates pressure support.  He does not have the mental status to protect airway or tolerate extubation.  Would require tracheostomy if continued support to follow neuro status.  Based on prior discussions with mom this is not consistent with his wishes  Acute renal failure likely ATN, Rhabdo and volume depletion  CRRT started 11/14,acid base improved. Hyperkalemia resolved P: -Appreciate nephrology management.  Tolerating intermittent HD, plan next is 11/22   Circulatory shock  Lactic acidosis resolved Empirical antimicrobial therapy P: Norepinephrine is off -Vancomycin stopped 11/19, plan to continue 10 days total cefepime (through 11/22)  BLE acute DVT Bilateral pressure ulcers Concern for decreased LE pulses earlier, arterial duplex without stenosis or decreased flow RV function okay so doubt PE P: -IV heparin as ordered -Lower extremity wound care, dressing changes  Hyperglycemia.  CBGs consistently 110s at this time -Change CBG check frequency to every shift   Goals of care 11/17 -Dr. Vassie Loll discussed with mom, this is her only child.  She is clear that he would not want tracheostomy and nursing home status if he does not  have neurological recovery.  Would continue supportive care through the weekend to see if there is any more neurological improvement   Best practice:  Diet: tube feeds Pain/Anxiety/Delirium protocol (if indicated): Pad protocol, RASS goal 0 effective 11/15 VAP protocol (if indicated): In place DVT prophylaxis: Therapeutic heparin started 11/14 for bilateral DVT GI prophylaxis: PPI Glucose control: SSI Mobility: Bedrest Code Status: Full code Family Communication: Mother updated by Dr Vassie Loll 11/19, by Dr Delton Coombes 11/21. Anticipate discussions regarding prognosis and possible withdrawal on 11/22.  Disposition: ICU   Levy Pupa, MD, PhD 06/23/2020, 8:13 AM [facility] [phone] or if no answer [phone]

## 2020-06-23 NOTE — Progress Notes (Signed)
ANTICOAGULATION CONSULT NOTE   Pharmacy Consult for heparin Indication: DVT   Patient Measurements: Height: 6\' 2"  (188 cm) Weight: 92.3 kg (203 lb 7.8 oz) IBW/kg (Calculated) : 82.2 Heparin Dosing Weight: 88kg   Labs: Recent Labs    06/21/20 0404 06/21/20 1600 06/22/20 0432 06/22/20 0432 06/22/20 0433 06/22/20 2113 06/23/20 0417  HGB 10.2*  --  9.3*  --   --   --   --   HCT 31.4*  --  28.2*  --   --   --   --   PLT 259  --  279  --   --   --   --   LABPROT 14.9  --  15.1  --   --   --  14.7  INR 1.2  --  1.2  --   --   --  1.2  HEPARINUNFRC 0.59  --   --   --  0.54  --  0.62  CREATININE 5.01*   < > 4.33*   < > 4.28* 5.14* 5.28*   < > = values in this interval not displayed.    Estimated Creatinine Clearance: 23.4 mL/min (A) (by C-G formula based on SCr of 5.28 mg/dL (H)).   Assessment: 34 year old male found down for unknown length of time, minimally responsive by family. Originally thought to be in DIC, found to have new large bilateral DVTs noted on dopplers.  Pharmacy dosing heparin.   Heparin level is therapeutic at 0.62, on 1250 units/hr. No s/sx of bleeding or infusion issues.   Goal of Therapy:  Heparin level 0.3-0.7 units/ml Monitor platelets by anticoagulation protocol: Yes   Plan:  - Continue heparin to 1250 units/h - Daily heparin level and CBC - Monitor for s/sx of bleed  36, PharmD, BCCCP Clinical Pharmacist  Phone: [phone] 06/23/2020 9:27 AM  Please check AMION for all [facility] phone numbers After 10:00 PM, call Main Pharmacy [phone]

## 2020-06-23 NOTE — Progress Notes (Signed)
[facility] ROUNDING NOTE   Subjective:   Brief history This is a 34 year old gentleman with a history of ADHD brought to the emergency room after being found down on the floor with minimal responsiveness in the GCS of 3.  He had acute kidney injury with BUN 315 creatinine 17 and potassium greater than 7.5.  The etiology of his acute kidney injury was thought to be ATN in the setting of severe dehydration hypotension and rhabdomyolysis.  His urinalysis is without protein.  His renal ultrasound showed increased echogenicity.  CRRT has been initiated.  Oliguric.  CRRT was discontinued 06/19/2020.  Underwent successful dialysis 06/21/2020 with 1 L removed.  Next dialysis will be planned 06/23/2020.  Subjective Some eye-opening.  Continue aggressive care.  Family do not wish to have trach a feeding tube placed  Blood pressure 126/83 pulse 94 temperature 99 O2 sats 90% FiO2 40%  IV heparin IV cefepime IV vancomycin  Sodium 134 potassium 5.6 chloride 98 CO2 16 BUN 172 creatinine 5.28 glucose 108   Objective:  Vital signs in last 24 hours:  Temp:  [98.6 F (37 C)-99.5 F (37.5 C)] 99 F (37.2 C) (11/21 0700) Pulse Rate:  [83-113] 103 (11/21 0700) Resp:  [17-31] 24 (11/21 0700) BP: (130-187)/(67-119) 144/83 (11/21 0700) SpO2:  [100 %] 100 % (11/21 0734) Arterial Line BP: (151-196)/(58-75) 170/72 (11/21 0700) FiO2 (%):  [40 %] 40 % (11/21 0734) Weight:  [92.3 kg] 92.3 kg (11/21 0430)  Weight change: -2.4 kg Filed Weights   06/21/20 1040 06/21/20 1425 06/23/20 0430  Weight: 94.7 kg 93.7 kg 92.3 kg    Intake/Output: I/O last 3 completed shifts: In: 3793 [I.V.:843.1; NG/GT:2750; IV Piggyback:199.9] Out: 695 [Urine:695]   Intake/Output this shift:  No intake/output data recorded.  General: Critically ill looking male with dry mucous membrane, sedated, intubated Heart:RRR, s1s2 nl Lungs: Coarse breath sound bilateral Abdomen:soft, Non-tender,  non-distended Extremities: Multiple necrotic skin changes in lower extremity Dialysis Access: Right femoral temporary HD catheter placed by ICU on 11/13.   Basic Metabolic Panel: Recent Labs  Lab 06/18/20 1655 06/18/20 1655 06/19/20 0430 06/19/20 1654 06/20/20 0351 06/20/20 1652 06/21/20 0404 06/21/20 0404 06/21/20 1600 06/21/20 1600 06/22/20 0432 06/22/20 0432 06/22/20 0433 06/22/20 2113 06/23/20 0417  NA 134*   < > 134*   < > 134*   < > 133*   < > 137  --  137  --  136 135 134*  K 4.8   < > 4.5   < > 5.1   < > 5.9*   < > 4.2  --  4.8  --  4.8 5.3* 5.6*  CL 103   < > 104   < > 102   < > 102   < > 101  --  102  --  102 99 98  CO2 19*   < > 21*   < > 18*   < > 17*   < > 20*  --  21*  --  21* 17* 16*  GLUCOSE 135*   < > 103*   < > 124*   < > 111*   < > 137*  --  117*  --  117* 105* 108*  BUN 48*   < > 50*   < > 94*   < > 151*   < > 85*  --  122*  --  122* 159* 172*  CREATININE 1.73*   < > 1.76*   < > 3.35*   < >  5.01*   < > 3.20*  --  4.33*  --  4.28* 5.14* 5.28*  CALCIUM 8.4*   < > 8.3*   < > 8.3*   < > 8.4*   < > 8.4*   < > 8.5*   < > 8.4* 8.5* 8.6*  MG 2.9*  --  2.6*  --  2.8*  --  2.8*  --   --   --   --   --   --   --  2.4  PHOS 3.9   < > 3.2   < > 4.8*   < > 7.1*   < > 5.1*  --  7.6*  --  7.6* 9.7* 10.1*   < > = values in this interval not displayed.    Liver Function Tests: Recent Labs  Lab 06/21/20 0404 06/21/20 1600 06/22/20 0433 06/22/20 2113 06/23/20 0417  ALBUMIN 1.8* 1.7* 1.7* 1.6* 1.7*   No results for input(s): LIPASE, AMYLASE in the last 168 hours. No results for input(s): AMMONIA in the last 168 hours.  CBC: Recent Labs  Lab 06/18/20 0218 06/19/20 0430 06/20/20 0351 06/21/20 0404 06/22/20 0432  WBC 24.0* 30.3* 30.1* 25.7* 19.4*  NEUTROABS  --   --   --   --  13.9*  HGB 13.9 12.4* 11.4* 10.2* 9.3*  HCT 41.0 37.8* 35.1* 31.4* 28.2*  MCV 93.0 94.3 93.9 94.3 93.7  PLT 105* 148* 213 259 279    Cardiac Enzymes: Recent Labs  Lab  06/16/20 1405 06/17/20 0547  CKTOTAL 3,990* 3,896*  CKMB 36.1*  --     BNP: Invalid input(s): POCBNP  CBG: Recent Labs  Lab 06/22/20 0652 06/22/20 1235 06/22/20 1543 06/22/20 2348 06/23/20 0607  GLUCAP 111* 119* 106* 108* 110*    Microbiology: Results for orders placed or performed during the hospital encounter of 06/13/2020  Respiratory Panel by RT PCR (Flu A&B, Covid) - Nasopharyngeal Swab     Status: None   Collection Time: 06/05/2020 10:41 AM   Specimen: Nasopharyngeal Swab  Result Value Ref Range Status   SARS Coronavirus 2 by RT PCR NEGATIVE NEGATIVE Final    Comment: (NOTE) SARS-CoV-2 target nucleic acids are NOT DETECTED.  The SARS-CoV-2 RNA is generally detectable in upper respiratoy specimens during the acute phase of infection. The lowest concentration of SARS-CoV-2 viral copies this assay can detect is 131 copies/mL. A negative result does not preclude SARS-Cov-2 infection and should not be used as the sole basis for treatment or other patient management decisions. A negative result may occur with  improper specimen collection/handling, submission of specimen other than nasopharyngeal swab, presence of viral mutation(s) within the areas targeted by this assay, and inadequate number of viral copies (<131 copies/mL). A negative result must be combined with clinical observations, patient history, and epidemiological information. The expected result is Negative.  Fact Sheet for Patients:  PinkCheek.be  Fact Sheet for Healthcare Providers:  GravelBags.it  This test is no t yet approved or cleared by the [place] FDA and  has been authorized for detection and/or diagnosis of SARS-CoV-2 by FDA under an Emergency Use Authorization (EUA). This EUA will remain  in effect (meaning this test can be used) for the duration of the COVID-19 declaration under Section 564(b)(1) of the Act, 21 U.S.C. section  360bbb-3(b)(1), unless the authorization is terminated or revoked sooner.     Influenza A by PCR NEGATIVE NEGATIVE Final   Influenza B by PCR NEGATIVE NEGATIVE Final    Comment: (NOTE)  The Xpert Xpress SARS-CoV-2/FLU/RSV assay is intended as an aid in  the diagnosis of influenza from Nasopharyngeal swab specimens and  should not be used as a sole basis for treatment. Nasal washings and  aspirates are unacceptable for Xpert Xpress SARS-CoV-2/FLU/RSV  testing.  Fact Sheet for Patients: PinkCheek.be  Fact Sheet for Healthcare Providers: GravelBags.it  This test is not yet approved or cleared by the [place] FDA and  has been authorized for detection and/or diagnosis of SARS-CoV-2 by  FDA under an Emergency Use Authorization (EUA). This EUA will remain  in effect (meaning this test can be used) for the duration of the  Covid-19 declaration under Section 564(b)(1) of the Act, 21  U.S.C. section 360bbb-3(b)(1), unless the authorization is  terminated or revoked. Performed at [facility], [place] [address], [place], [place] [address]   Blood culture (routine x 2)     Status: None   Collection Time: 06/29/2020 11:20 AM   Specimen: BLOOD LEFT ARM  Result Value Ref Range Status   Specimen Description BLOOD LEFT ARM  Final   Special Requests   Final    BOTTLES DRAWN AEROBIC AND ANAEROBIC Blood Culture adequate volume   Culture   Final    NO GROWTH 5 DAYS Performed at [facility], [address], [place], [place] [address]    Report Status 06/20/2020 FINAL  Final  MRSA PCR Screening     Status: None   Collection Time: 06/18/2020  5:57 PM   Specimen: Nasal Mucosa; Nasopharyngeal  Result Value Ref Range Status   MRSA by PCR NEGATIVE NEGATIVE Final    Comment:        The GeneXpert MRSA Assay (FDA approved for NASAL specimens only), is one component of a comprehensive MRSA colonization surveillance program. It is  not intended to diagnose MRSA infection nor to guide or monitor treatment for MRSA infections. Performed at [facility], [place] [address], [place], [place] [address]   Culture, respiratory (non-expectorated)     Status: None   Collection Time: 06/19/20  8:57 AM   Specimen: Tracheal Aspirate; Respiratory  Result Value Ref Range Status   Specimen Description TRACHEAL ASPIRATE  Final   Special Requests NONE  Final   Gram Stain   Final    RARE WBC PRESENT,BOTH PMN AND MONONUCLEAR RARE YEAST Performed at [facility], [address] [place], [place] [address]    Culture MODERATE CANDIDA ALBICANS  Final   Report Status 06/22/2020 FINAL  Final  Culture, respiratory (non-expectorated)     Status: None   Collection Time: 06/20/20 12:55 AM   Specimen: Tracheal Aspirate; Respiratory  Result Value Ref Range Status   Specimen Description TRACHEAL ASPIRATE  Final   Special Requests NONE  Final   Gram Stain   Final    ABUNDANT WBC PRESENT, PREDOMINANTLY PMN MODERATE YEAST Performed at [facility], [address] [place], [place] [address]    Culture FEW CANDIDA ALBICANS  Final   Report Status 06/22/2020 FINAL  Final  Culture, blood (routine x 2)     Status: None (Preliminary result)   Collection Time: 06/20/20  3:51 AM   Specimen: BLOOD LEFT ARM  Result Value Ref Range Status   Specimen Description BLOOD LEFT ARM  Final   Special Requests   Final    BOTTLES DRAWN AEROBIC AND ANAEROBIC Blood Culture adequate volume   Culture   Final    NO GROWTH 2 DAYS  Performed at [facility], [place] [address], [place], [place] [address]    Report Status PENDING  Incomplete  Culture, blood (routine x 2)     Status: None (Preliminary result)   Collection Time: 06/20/20  4:02 AM   Specimen: BLOOD LEFT HAND  Result Value Ref Range Status   Specimen Description BLOOD LEFT HAND  Final   Special Requests   Final    BOTTLES DRAWN AEROBIC AND ANAEROBIC Blood Culture adequate  volume   Culture   Final    NO GROWTH 2 DAYS Performed at [facility], [place] [address], [place],  [address]    Report Status PENDING  Incomplete    Coagulation Studies: Recent Labs    06/21/20 0404 06/22/20 0432 06/23/20 0417  LABPROT 14.9 15.1 14.7  INR 1.2 1.2 1.2    Urinalysis: No results for input(s): COLORURINE, LABSPEC, PHURINE, GLUCOSEU, HGBUR, BILIRUBINUR, KETONESUR, PROTEINUR, UROBILINOGEN, NITRITE, LEUKOCYTESUR in the last 72 hours.  Invalid input(s): APPERANCEUR    Imaging: DG Chest Port 1 View  Result Date: 06/22/2020 CLINICAL DATA:  Respiratory failure, hypoxia. EXAM: PORTABLE CHEST 1 VIEW COMPARISON:  06/20/2020 and prior radiographs FINDINGS: An endotracheal tube with tip 4 cm above the carina, NG tube entering the stomach with tip off the field of view and LEFT IJ central venous catheter with tip overlying the UPPER SVC again noted. There is no evidence of focal airspace disease, pulmonary edema, suspicious pulmonary nodule/mass, pleural effusion, or pneumothorax. No acute bony abnormalities are identified. IMPRESSION: Unchanged exam. Electronically Signed   By: Margarette Canada M.D.   On: 06/22/2020 09:31     Medications:   . sodium chloride 10 mL/hr at 06/23/20 0700  . sodium chloride    . sodium chloride    . ceFEPime (MAXIPIME) IV Stopped (06/22/20 2225)  . feeding supplement (VITAL AF 1.2 CAL) 70 mL/hr at 06/23/20 0700  . heparin 1,250 Units/hr (06/23/20 0700)  . norepinephrine (LEVOPHED) Adult infusion Stopped (06/20/20 1528)   . chlorhexidine gluconate (MEDLINE KIT)  15 mL Mouth Rinse BID  . Chlorhexidine Gluconate Cloth  6 each Topical Q0600  . Chlorhexidine Gluconate Cloth  6 each Topical Q0600  . feeding supplement (PROSource TF)  45 mL Per Tube TID  . mouth rinse  15 mL Mouth Rinse 10 times per day  . pantoprazole sodium  40 mg Per Tube Q1200  . sodium chloride flush  10-40 mL Intracatheter Q12H   sodium chloride, sodium chloride,  sodium chloride, acetaminophen (TYLENOL) oral liquid 160 mg/5 mL, alteplase, docusate, fentaNYL (SUBLIMAZE) injection, heparin, lidocaine (PF), lidocaine-prilocaine, ondansetron (ZOFRAN) IV, pentafluoroprop-tetrafluoroeth, polyethylene glycol, sodium chloride flush  Assessment/ Plan:  1. Acute kidney injury likely ATN in the setting of severe dehydration/hypotension, rhabdomyolysis after found down on the floor. UA without proteinuria. Kidney ultrasound consistent with increased echogenicity without acute finding. Started CRRT for multiple electrolytes abnormalities including hyperkalemia.   CRRT discontinued on 06/19/2020.  Underwent dialysis 06/21/2020 tolerated well.  We will plan dialysis 06/23/2020.    2.  Hyperkalemia resolved with plan dialysis 06/23/2020  3.Unresponsive/acute metabolic encephalopathy: MRI of the brain without hemorrhage or mass-effect however has hypoxic ischemic injury.    EEG revealing diffuse encephalopathy.  Family did not appear to wish for trach and feeding tube placement.  4.Acute respiratory failure with hypoxia: intubated. Manage byPCCM.  5.Hypernatremia: Appears to be resolved    6 Acidosis: Plan dialysis 06/23/2020.  Prognosis guarded.  Discussed with ICU team Dr. Lamonte Sakai.  Would favor  intermittent dialysis today pending end of life goals of care discussion 06/24/2020   LOS: [place] _0 _1 :09 AM

## 2020-06-23 NOTE — Progress Notes (Signed)
ANTICOAGULATION CONSULT NOTE   Pharmacy Consult for heparin Indication: DVT   Patient Measurements: Height: 6\' 2"  (188 cm) Weight: 92.3 kg (203 lb 7.8 oz) IBW/kg (Calculated) : 82.2 Heparin Dosing Weight: 88kg   Labs: Recent Labs    06/21/20 0404 06/21/20 0404 06/21/20 1600 06/22/20 0432 06/22/20 0433 06/22/20 2113 06/23/20 0417 06/23/20 1618  HGB 10.2*  --   --  9.3*  --   --   --   --   HCT 31.4*  --   --  28.2*  --   --   --   --   PLT 259  --   --  279  --   --   --   --   LABPROT 14.9  --   --  15.1  --   --  14.7  --   INR 1.2  --   --  1.2  --   --  1.2  --   HEPARINUNFRC 0.59   < >  --   --  0.54  --  0.62 0.50  CREATININE 5.01*   < >   < > 4.33* 4.28* 5.14* 5.28*  --    < > = values in this interval not displayed.    Estimated Creatinine Clearance: 23.4 mL/min (A) (by C-G formula based on SCr of 5.28 mg/dL (H)).   Assessment: 34 year old male found down for unknown length of time, minimally responsive by family. Originally thought to be in DIC, found to have new large bilateral DVTs noted on dopplers.  Pharmacy dosing heparin.   RN requested heparin level repeat with bleeding from peripheral line. Heparin level therapeutic at 0.5, peripheral line removed and bleeding resolved with pressure.  Goal of Therapy:  Heparin level 0.3-0.7 units/ml Monitor platelets by anticoagulation protocol: Yes   Plan:  -Continue heparin 1250 units/h -Daily heparin level and CBC   34, PharmD, BCPS Clinical Pharmacist [phone] Please check AMION for all [facility] Pharmacy numbers 06/23/2020

## 2020-06-24 LAB — RENAL FUNCTION PANEL
Albumin: 1.6 g/dL — ABNORMAL LOW (ref 3.5–5.0)
Albumin: 1.7 g/dL — ABNORMAL LOW (ref 3.5–5.0)
Anion gap: 21 — ABNORMAL HIGH (ref 5–15)
Anion gap: 20 — ABNORMAL HIGH (ref 5–15)
BUN: 151 mg/dL — ABNORMAL HIGH (ref 6–20)
BUN: 171 mg/dL — ABNORMAL HIGH (ref 6–20)
CO2: 19 mmol/L — ABNORMAL LOW (ref 22–32)
CO2: 18 mmol/L — ABNORMAL LOW (ref 22–32)
Calcium: 8.5 mg/dL — ABNORMAL LOW (ref 8.9–10.3)
Calcium: 8.2 mg/dL — ABNORMAL LOW (ref 8.9–10.3)
Chloride: 97 mmol/L — ABNORMAL LOW (ref 98–111)
Chloride: 98 mmol/L (ref 98–111)
Creatinine, Ser: 4.7 mg/dL — ABNORMAL HIGH (ref 0.61–1.24)
Creatinine, Ser: 5.3 mg/dL — ABNORMAL HIGH (ref 0.61–1.24)
GFR, Estimated: 16 mL/min — ABNORMAL LOW (ref >60)
GFR, Estimated: 14 mL/min — ABNORMAL LOW (ref >60)
Glucose, Bld: 119 mg/dL — ABNORMAL HIGH (ref 70–99)
Glucose, Bld: 120 mg/dL — ABNORMAL HIGH (ref 70–99)
Phosphorus: 9.7 mg/dL — ABNORMAL HIGH (ref 2.5–4.6)
Phosphorus: 10.7 mg/dL — ABNORMAL HIGH (ref 2.5–4.6)
Potassium: 5 mmol/L (ref 3.5–5.1)
Potassium: 5.2 mmol/L — ABNORMAL HIGH (ref 3.5–5.1)
Sodium: 137 mmol/L (ref 135–145)
Sodium: 136 mmol/L (ref 135–145)

## 2020-06-24 LAB — GLUCOSE, CAPILLARY
Glucose-Capillary: 104 mg/dL — ABNORMAL HIGH (ref 70–99)
Glucose-Capillary: 107 mg/dL — ABNORMAL HIGH (ref 70–99)
Glucose-Capillary: 110 mg/dL — ABNORMAL HIGH (ref 70–99)
Glucose-Capillary: 114 mg/dL — ABNORMAL HIGH (ref 70–99)
Glucose-Capillary: 117 mg/dL — ABNORMAL HIGH (ref 70–99)

## 2020-06-24 LAB — PROTIME-INR
INR: 1.2 (ref 0.8–1.2)
Prothrombin Time: 15.1 seconds (ref 11.4–15.2)

## 2020-06-24 LAB — HEPARIN LEVEL (UNFRACTIONATED): Heparin Unfractionated: 0.51 IU/mL (ref 0.30–0.70)

## 2020-06-24 LAB — MAGNESIUM: Magnesium: 2.5 mg/dL — ABNORMAL HIGH (ref 1.7–2.4)

## 2020-06-24 MED ORDER — SEVELAMER CARBONATE 800 MG PO TABS
800.0000 mg | ORAL_TABLET | Freq: Three times a day (TID) | ORAL | Status: DC
  Administered 2020-06-24: 800 mg via ORAL
  Filled 2020-06-24: qty 1

## 2020-06-24 MED ORDER — SEVELAMER CARBONATE 0.8 G PO PACK
0.8000 g | PACK | Freq: Three times a day (TID) | ORAL | Status: DC
  Administered 2020-06-24 – 2020-06-25 (×4): 0.8 g
  Filled 2020-06-24 (×6): qty 1

## 2020-06-24 NOTE — Progress Notes (Addendum)
[facility] Progress Note  Name: Derek Jennings MRN: [id number] DOB: 1985-08-17  Chief Complaint:  Found down at home  Subjective:  had 400 mL UOP over 11/21 charted.  Last HD was 11/21 with 2 kg UF charted.  Spoke with bedside RN - goals of care discussions are ongoing but no firm plans yet.   Review of systems:  Unable to obtain 2/2 mech ventilation   Intake/Output Summary (Last 24 hours) at 06/24/2020 0618 Last data filed at 06/24/2020 0200 Gross per 24 hour  Intake 2063.9 ml  Output 2400 ml  Net -336.1 ml    Vitals:  Vitals:   06/24/20 0100 06/24/20 0141 06/24/20 0200 06/24/20 0427  BP: (!) 141/73 (!) 141/73 129/80   Pulse: (!) 106 88 (!) 109   Resp: (!) 22 18 (!) 21   Temp: 100 F (37.8 C)  99.9 F (37.7 C)   TempSrc:      SpO2: 100% 100% 100%   Weight:    91.2 kg  Height:         Physical Exam:  General adult male in bed intubated  HEENT normocephalic atraumatic  Neck supple trachea midline Lungs clear to auscultation bilaterally on anterior exam Heart S1S2 no rub Abdomen soft nontender nondistended Extremities 1+ edema  Neuro does not respond to exam Access right fem nontunneled   Medications reviewed   Labs:  BMP Latest Ref Rng & Units 06/24/2020 06/23/2020 06/23/2020  Glucose 70 - 99 mg/dL 400(Q) 676(P) 950(D)  BUN 6 - 20 mg/dL 326(Z) 124(P) 809(X)  Creatinine 0.61 - 1.24 mg/dL 8.33(A) 2.50(N) 3.97(Q)  Sodium 135 - 145 mmol/L 137 136 132(L)  Potassium 3.5 - 5.1 mmol/L 5.0 4.7 5.5(H)  Chloride 98 - 111 mmol/L 97(L) 98 97(L)  CO2 22 - 32 mmol/L 19(L) 20(L) 15(L)  Calcium 8.9 - 10.3 mg/dL 7.3(A) 1.9(F) 8.3(L)     Assessment/Plan:   1. Acute kidney injury likely ATN in the setting of severe dehydration/hypotension,rhabdomyolysis after found down on the floor. UA without proteinuria.Kidney ultrasound consistent with increased echogenicity without acute finding.  Started CRRT for multiple electrolytes abnormalities including  hyperkalemia.  CRRT discontinued on 06/19/2020.  Underwent first iHD dialysis 06/21/2020 - Continue HD as aligns with goals of care; next tx would be 11/23  2.  Hyperkalemia improved with HD.  Please transition feeds to nepro  3.Unresponsive/acute metabolic encephalopathy:MRI of the brain without hemorrhage or mass-effect however has hypoxic ischemic injury.   EEG revealing diffuse encephalopathy. Goals of care discussions per primary team   4.Acute respiratory failure with hypoxia: intubated. Managed byPCCM.  5.Hypernatremia: resolved  6 Metabolic Acidosis: improved with RRT  7. Hyperphos - start renvela for now.  Please transition feeds to nepro.  Prognosis guarded.  Goals of care per primary team   Estanislado Emms, MD 06/24/2020 6:30 AM  Received update from critical care after their meeting with family - they decided no further HD.   Estanislado Emms, MD 10:46 AM 06/24/2020

## 2020-06-24 NOTE — Progress Notes (Signed)
ANTICOAGULATION CONSULT NOTE   Pharmacy Consult for heparin Indication: DVT   Patient Measurements: Height: 6\' 2"  (188 cm) Weight: 91.2 kg (201 lb 1 oz) IBW/kg (Calculated) : 82.2 Heparin Dosing Weight: 88kg   Labs: Recent Labs    06/22/20 0432 06/22/20 0433 06/23/20 0417 06/23/20 0417 06/23/20 1617 06/23/20 1618 06/23/20 2115 06/24/20 0445  HGB 9.3*  --   --   --   --   --  9.0*  --   HCT 28.2*  --   --   --   --   --  27.6*  --   PLT 279  --   --   --   --   --  301  --   LABPROT 15.1  --  14.7  --   --   --   --  15.1  INR 1.2  --  1.2  --   --   --   --  1.2  HEPARINUNFRC  --    < > 0.62  --   --  0.50  --  0.51  CREATININE 4.33*   < > 5.28*   < > 5.95*  --  4.23* 4.70*   < > = values in this interval not displayed.    Estimated Creatinine Clearance: 26.2 mL/min (A) (by C-G formula based on SCr of 4.7 mg/dL (H)).   Assessment: 34 year old male found down for unknown length of time, minimally responsive by family. Originally thought to be in DIC, found to have new large bilateral DVTs noted on dopplers.  Pharmacy dosing heparin.   Heparin level is therapeutic at 0.51, on 1250 units/hr. Signs of bleeding around peripheral line sites last evening per nurse, but seems to have resolved. Will continue to monitor closely   Goal of Therapy:  Heparin level 0.3-0.7 units/ml Monitor platelets by anticoagulation protocol: Yes   Plan:  - Continue heparin to 1250 units/h - Daily heparin level and CBC - Monitor for s/sx of bleed  34, PharmD, [facility] Pharmacy Resident [phone] 06/24/2020 10:57 AM

## 2020-06-24 NOTE — Plan of Care (Signed)
  Problem: Clinical Measurements: Goal: Ability to maintain clinical measurements within normal limits will improve Outcome: Progressing Goal: Will remain free from infection Outcome: Progressing Goal: Cardiovascular complication will be avoided Outcome: Progressing   Problem: Nutrition: Goal: Adequate nutrition will be maintained Outcome: Progressing   Problem: Elimination: Goal: Will not experience complications related to bowel motility Outcome: Progressing Goal: Will not experience complications related to urinary retention Outcome: Progressing   Problem: Pain Managment: Goal: General experience of comfort will improve Outcome: Progressing   Problem: Safety: Goal: Ability to remain free from injury will improve Outcome: Progressing   Problem: Clinical Measurements: Goal: Complications related to the disease process or treatment will be avoided or minimized Outcome: Progressing Goal: Dialysis access will remain free of complications Outcome: Progressing   Problem: Respiratory: Goal: Respiratory symptoms related to disease process will be avoided Outcome: Progressing   Problem: Urinary Elimination: Goal: Progression of disease will be identified and treated Outcome: Progressing   Problem: Education: Goal: Knowledge of General Education information will improve Description: Including pain rating scale, medication(s)/side effects and non-pharmacologic comfort measures Outcome: Not Progressing   Problem: Health Behavior/Discharge Planning: Goal: Ability to manage health-related needs will improve Outcome: Not Progressing   Problem: Clinical Measurements: Goal: Diagnostic test results will improve Outcome: Not Progressing Goal: Respiratory complications will improve Outcome: Not Progressing   Problem: Activity: Goal: Risk for activity intolerance will decrease Outcome: Not Progressing   Problem: Skin Integrity: Goal: Risk for impaired skin integrity will  decrease Outcome: Not Progressing   Problem: Activity: Goal: Ability to tolerate increased activity will improve Outcome: Not Progressing   Problem: Respiratory: Goal: Ability to maintain a clear airway and adequate ventilation will improve Outcome: Not Progressing   Problem: Education: Goal: Knowledge of disease and its progression will improve Outcome: Not Progressing   Problem: Health Behavior/Discharge Planning: Goal: Ability to manage health-related needs will improve Outcome: Not Progressing   Problem: Activity: Goal: Activity intolerance will improve Outcome: Not Progressing   Problem: Fluid Volume: Goal: Fluid volume balance will be maintained or improved Outcome: Not Progressing   Problem: Nutritional: Goal: Ability to make appropriate dietary choices will improve Outcome: Not Progressing

## 2020-06-24 NOTE — Progress Notes (Signed)
NAMEDawaun Jennings, MRN:  [id number], DOB:  1985-12-31, LOS: 9 ADMISSION DATE:  06/14/2020, CONSULTATION DATE: 11/13 REFERRING MD:  Dr. Lynelle Doctor, CHIEF COMPLAINT: Acute renal failure  Brief History   34 year old male presents to ED via EMS after being found minimally responsive by family.  Last seen normal 1 week prior to admission.  Known history of depression and drug abuse.  PCCM consulted for further management and admission   Past Medical History  Depression Drug abuse  Significant Hospital Events   Admitted 11/13 required intubation for airway protection, requiring multiple pressors for hemodynamic support CRRT started 11/14.  Bilateral DVTs on both lower extremities.  IV heparin started.  MRI obtained showing diffuse injury worrisome for an anoxic process 11/17 CRRT stopped, to IHD  Consults:  Nephrology  Procedures:    Significant Diagnostic Tests:  Chest x-ray 11/13 >> Subtle patchy airspace disease at the left base. ECHO 11/13>> Left Ventricle: Left ventricular ejection fraction, by estimation, is 60  to 65%. The left ventricle has normal function. The left ventricle has no  regional wall motion abnormalities. The left ventricular internal cavity size was normal in size. There is mild left ventricular hypertrophy. Left ventricular diastolic parameters  are consistent with Grade I diastolic dysfunction (impaired relaxation). Right Ventricle: The right ventricular size is normal. Right ventricular systolic function is normal. Left Atrium: Left atrial size was normal in size.   LE vasc US 11/14>> RIGHT: - Findings consistent with acute deep vein thrombosis involving the right  femoral vein, right popliteal vein, right posterior tibial veins, and right peroneal veins.   LEFT: - Findings consistent with acute deep vein thrombosis involving the left  femoral vein, left proximal profunda vein, left popliteal vein, left posterior tibial veins, and left peroneal veins.   11/16  Bilateral LE arterial dopplers>>No obvious evidence of hemodynamically significant stenosis with distal dampened of flow.    Micro Data:  COVID and flu 11/13 >> negative MRSA 11/13>negative Blood cultures 11/13 >ng 06/19/2020 sputum>> Antimicrobials:  11/13 vancomycin>> 11/19 11/13 cefepime>>   Interim history/subjective:   No interval change reported Tolerating PSV   Objective   Blood pressure 132/71, pulse (!) 111, temperature 99.3 F (37.4 C), resp. rate (!) 24, height 6\' 2"  (1.88 m), weight 91.2 kg, SpO2 100 %. CVP:  [3 mmHg-11 mmHg] 5 mmHg  Vent Mode: PRVC FiO2 (%):  [40 %] 40 % Set Rate:  [14 bmp] 14 bmp Vt Set:  [650 mL] 650 mL PEEP:  [5 cmH20] 5 cmH20 Pressure Support:  [5 cmH20] 5 cmH20 Plateau Pressure:  [14 cmH20-19 cmH20] 15 cmH20   Intake/Output Summary (Last 24 hours) at 06/24/2020 1557 Last data filed at 06/24/2020 1532 Gross per 24 hour  Intake 3227.45 ml  Output 2440 ml  Net 787.45 ml   Filed Weights   06/23/20 1700 06/23/20 1940 06/24/20 0427  Weight: 92.3 kg 90.5 kg 91.2 kg    Examination: General: Critically ill young man, ventilated, unresponsive HEENT: ET tube in place, G-tube in place Neuro: Eyes are spontaneously open this morning, pupils equal.  He does not track, does not respond to voice, stimulus or follow commands.  He does have spontaneous respiratory effort CV: Regular, distant, no murmur PULM: Clear bilaterally GI: Nondistended, positive bowel sounds GU: No urine output Extremities: 1+ bilateral upper and lower extremity edema.  Lower extremity pressure wounds as documented in previous photos below   Left lower ext  Left heel   Rt lower ext  Resolved Hospital Problem list   Hyperkalemia  Hypernatremia  Thrombocytopenia, Coagulopathy  Assessment & Plan:    Acute metabolic and hypoxemic encephalopathy. multifactorial (uremia, anoxic brain injury, metabolic derangements) c/b concern for cerebellum infarct  -Found  unresponsive by family after approximately 1 week downtime; felt consequence of overdose, UDS positive for amphetamines  -Brain MRI obtained overnight 11/14 with diffuse white matter abnormality most consistent with hypoxic/ischemic injury P: -Grim prognosis for neurological recovery.  - Discussed with mother. Plan for compassionate extubation once all family has come.   Acute hypoxemic respiratory failure due to inability to protect airway -In the setting of prolonged downtime and acute uremic encephalopathy.   P: -Tolerates pressure support.  He may tolerate extubation for some time given strong cough.  Acute renal failure likely ATN, Rhabdo and volume depletion  CRRT started 11/14,acid base improved. Hyperkalemia resolved P: -Appreciate nephrology management.  - Stop further HD as goal is to transition to comfort care.    Circulatory shock  Lactic acidosis resolved Empirical antimicrobial therapy P: Norepinephrine is off -Vancomycin stopped 11/19, plan to continue 10 days total cefepime (through 11/22)  BLE acute DVT Bilateral pressure ulcers Concern for decreased LE pulses earlier, arterial duplex without stenosis or decreased flow RV function okay so doubt PE P: -IV heparin as ordered -Lower extremity wound care, dressing changes  Hyperglycemia.  CBGs consistently 110s at this time -Change CBG check frequency to every shift   Goals of care 11/22 - I re-confirmed goals of care with mother - plan to transition to comfort care and compassionate extubation after all family has visited.   Best practice:  Diet: tube feeds Pain/Anxiety/Delirium protocol (if indicated): Pad protocol, RASS goal 0 effective 11/15 VAP protocol (if indicated): In place DVT prophylaxis: Therapeutic heparin started 11/14 for bilateral DVT GI prophylaxis: PPI Glucose control: SSI Mobility: Bedrest Code Status: Full code Family Communication: Mother updated by Dr Vassie Loll 11/19, by Dr Delton Coombes 11/21.  Anticipate discussions regarding prognosis and possible withdrawal on 11/22.  Disposition: ICU  CRITICAL CARE Performed by: Lynnell Catalan   Total critical care time: 40 minutes  Critical care time was exclusive of separately billable procedures and treating other patients.  Critical care was necessary to treat or prevent imminent or life-threatening deterioration.  Critical care was time spent personally by me on the following activities: development of treatment plan with patient and/or surrogate as well as nursing, discussions with consultants, evaluation of patient's response to treatment, examination of patient, obtaining history from patient or surrogate, ordering and performing treatments and interventions, ordering and review of laboratory studies, ordering and review of radiographic studies, pulse oximetry, re-evaluation of patient's condition and participation in multidisciplinary rounds.  Lynnell Catalan, MD [facility] ICU Physician [facility]  Critical Care  Pager: [phone] Mobile: [phone] After hours: [phone].    06/24/2020, 4:01 PM

## 2020-06-24 NOTE — Progress Notes (Signed)
Nutrition Brief Note  Chart reviewed. Pt now transitioning to comfort care.  No further nutrition interventions warranted at this time.  Please re-consult as needed.   Jessee Mezera RD, LDN Clinical Nutrition Pager listed in AMION    

## 2020-06-25 LAB — RENAL FUNCTION PANEL
Albumin: 1.7 g/dL — ABNORMAL LOW (ref 3.5–5.0)
Anion gap: 20 — ABNORMAL HIGH (ref 5–15)
BUN: 196 mg/dL — ABNORMAL HIGH (ref 6–20)
CO2: 17 mmol/L — ABNORMAL LOW (ref 22–32)
Calcium: 8.3 mg/dL — ABNORMAL LOW (ref 8.9–10.3)
Chloride: 99 mmol/L (ref 98–111)
Creatinine, Ser: 5.76 mg/dL — ABNORMAL HIGH (ref 0.61–1.24)
GFR, Estimated: 13 mL/min — ABNORMAL LOW (ref >60)
Glucose, Bld: 121 mg/dL — ABNORMAL HIGH (ref 70–99)
Phosphorus: 11.8 mg/dL — ABNORMAL HIGH (ref 2.5–4.6)
Potassium: 5.3 mmol/L — ABNORMAL HIGH (ref 3.5–5.1)
Sodium: 136 mmol/L (ref 135–145)

## 2020-06-25 LAB — CULTURE, BLOOD (ROUTINE X 2)
Culture: NO GROWTH
Culture: NO GROWTH
Special Requests: ADEQUATE
Special Requests: ADEQUATE

## 2020-06-25 LAB — GLUCOSE, CAPILLARY
Glucose-Capillary: 110 mg/dL — ABNORMAL HIGH (ref 70–99)
Glucose-Capillary: 117 mg/dL — ABNORMAL HIGH (ref 70–99)
Glucose-Capillary: 110 mg/dL — ABNORMAL HIGH (ref 70–99)

## 2020-06-25 LAB — PROTIME-INR
INR: 1.1 (ref 0.8–1.2)
Prothrombin Time: 14 seconds (ref 11.4–15.2)

## 2020-06-25 LAB — HEPARIN LEVEL (UNFRACTIONATED): Heparin Unfractionated: 0.48 IU/mL (ref 0.30–0.70)

## 2020-06-25 LAB — MAGNESIUM: Magnesium: 2.6 mg/dL — ABNORMAL HIGH (ref 1.7–2.4)

## 2020-06-25 MED ORDER — DEXTROSE 5 % IV SOLN: INTRAVENOUS | Status: DC

## 2020-06-25 MED ORDER — GLYCOPYRROLATE 0.2 MG/ML IJ SOLN
0.2000 mg | INTRAMUSCULAR | Status: DC | PRN
  Administered 2020-06-25 – 2020-06-27 (×4): 0.2 mg via INTRAVENOUS
  Filled 2020-06-25 (×5): qty 1

## 2020-06-25 MED ORDER — DIPHENHYDRAMINE HCL 50 MG/ML IJ SOLN: 25.0000 mg | INTRAMUSCULAR | Status: DC | PRN

## 2020-06-25 MED ORDER — NEPRO/CARBSTEADY PO LIQD
1000.0000 mL | ORAL | Status: DC
  Administered 2020-06-25: 1000 mL

## 2020-06-25 MED ORDER — ACETAMINOPHEN 325 MG PO TABS: 650.0000 mg | ORAL_TABLET | Freq: Four times a day (QID) | ORAL | Status: DC | PRN

## 2020-06-25 MED ORDER — GLYCOPYRROLATE 0.2 MG/ML IJ SOLN
0.2000 mg | INTRAMUSCULAR | Status: DC | PRN
  Administered 2020-06-26 (×2): 0.2 mg via SUBCUTANEOUS
  Filled 2020-06-25: qty 1

## 2020-06-25 MED ORDER — ACETAMINOPHEN 650 MG RE SUPP: 650.0000 mg | Freq: Four times a day (QID) | RECTAL | Status: DC | PRN

## 2020-06-25 MED ORDER — GLYCOPYRROLATE 1 MG PO TABS
1.0000 mg | ORAL_TABLET | ORAL | Status: DC | PRN
  Filled 2020-06-25: qty 1

## 2020-06-25 MED ORDER — MORPHINE SULFATE (PF) 2 MG/ML IV SOLN
2.0000 mg | INTRAVENOUS | Status: DC | PRN
  Administered 2020-06-26 – 2020-06-27 (×6): 2 mg via INTRAVENOUS
  Filled 2020-06-25 (×6): qty 1

## 2020-06-25 MED ORDER — LORAZEPAM 2 MG/ML IJ SOLN: 2.0000 mg | INTRAMUSCULAR | Status: DC | PRN

## 2020-06-25 MED ORDER — POLYVINYL ALCOHOL 1.4 % OP SOLN
1.0000 [drp] | Freq: Four times a day (QID) | OPHTHALMIC | Status: DC | PRN
  Filled 2020-06-25: qty 15

## 2020-06-25 NOTE — Progress Notes (Signed)
Derek Jennings, MRN:  [id number], DOB:  [birth year], LOS: 10 ADMISSION DATE:  06/24/2020, CONSULTATION DATE: 11/13 REFERRING MD:  Dr. Lynelle Doctor, CHIEF COMPLAINT: Acute renal failure  Brief History   34 year old male presents to ED via EMS after being found minimally responsive by family.  Last seen normal 1 week prior to admission.  Known history of depression and drug abuse.  PCCM consulted for further management and admission   Past Medical History  Depression Drug abuse  Significant Hospital Events   Admitted 11/13 required intubation for airway protection, requiring multiple pressors for hemodynamic support CRRT started 11/14.  Bilateral DVTs on both lower extremities.  IV heparin started.  MRI obtained showing diffuse injury worrisome for an anoxic process 11/17 CRRT stopped, to IHD  Consults:  Nephrology  Procedures:    Significant Diagnostic Tests:  Chest x-ray 11/13 >> Subtle patchy airspace disease at the left base. ECHO 11/13>> Left Ventricle: Left ventricular ejection fraction, by estimation, is 60  to 65%. The left ventricle has normal function. The left ventricle has no  regional wall motion abnormalities. The left ventricular internal cavity size was normal in size. There is mild left ventricular hypertrophy. Left ventricular diastolic parameters  are consistent with Grade I diastolic dysfunction (impaired relaxation). Right Ventricle: The right ventricular size is normal. Right ventricular systolic function is normal. Left Atrium: Left atrial size was normal in size.   LE vasc US 11/14>> RIGHT: - Findings consistent with acute deep vein thrombosis involving the right  femoral vein, right popliteal vein, right posterior tibial veins, and right peroneal veins.   LEFT: - Findings consistent with acute deep vein thrombosis involving the left  femoral vein, left proximal profunda vein, left popliteal vein, left posterior tibial veins, and left peroneal veins.    11/16 Bilateral LE arterial dopplers>>No obvious evidence of hemodynamically significant stenosis with distal dampened of flow.    Micro Data:  COVID and flu 11/13 >> negative MRSA 11/13>negative Blood cultures 11/13 >ng 06/19/2020 sputum>> Antimicrobials:  11/13 vancomycin>> 11/19 11/13 cefepime>>   Interim history/subjective:   No interval change reported Tolerating PSV  Objective   Blood pressure 135/73, pulse 95, temperature 99 F (37.2 C), resp. rate 16, height 6\' 2"  (1.88 m), weight 95.8 kg, SpO2 100 %. CVP:  [12 mmHg-19 mmHg] 16 mmHg  Vent Mode: PRVC FiO2 (%):  [40 %] 40 % Set Rate:  [14 bmp] 14 bmp Vt Set:  [650 mL] 650 mL PEEP:  [5 cmH20] 5 cmH20 Plateau Pressure:  [15 cmH20-28 cmH20] 20 cmH20   Intake/Output Summary (Last 24 hours) at 06/25/2020 1327 Last data filed at 06/25/2020 0800 Gross per 24 hour  Intake 1690.59 ml  Output 495 ml  Net 1195.59 ml   Filed Weights   06/23/20 1940 06/24/20 0427 06/25/20 0416  Weight: 90.5 kg 91.2 kg 95.8 kg    Examination: General: Critically ill young man, ventilated, unresponsive HEENT: ET tube in place, G-tube in place Neuro: Eyes are spontaneously open this morning, pupils equal.  He does not track, does not respond to voice, stimulus or follow commands.  He does have spontaneous respiratory effort CV: Regular, distant, no murmur PULM: Clear bilaterally GI: Nondistended, positive bowel sounds GU: No urine output Extremities: 1+ bilateral upper and lower extremity edema.  Lower extremity pressure wounds as documented in previous photos below   Left lower ext  Left heel   Rt lower ext     Resolved Hospital Problem list   Hyperkalemia  Hypernatremia  Thrombocytopenia, Coagulopathy  Assessment & Plan:    Acute metabolic and hypoxemic encephalopathy. multifactorial (uremia, anoxic brain injury, metabolic derangements) c/b concern for cerebellum infarct  -Found unresponsive by family after  approximately 1 week downtime; felt consequence of overdose, UDS positive for amphetamines  -Brain MRI obtained overnight 11/14 with diffuse white matter abnormality most consistent with hypoxic/ischemic injury P: -Grim prognosis for neurological recovery.  - Discussed with mother. Proceed with compassionate extubation today  Acute hypoxemic respiratory failure due to inability to protect airway -In the setting of prolonged downtime and acute uremic encephalopathy.   P: -Tolerates pressure support.  He may tolerate extubation for some time given strong cough.  Acute renal failure likely ATN, Rhabdo and volume depletion  CRRT started 11/14,acid base improved. Hyperkalemia resolved P: -Appreciate nephrology management.  - Stop further HD as goal is to transition to comfort care.    Circulatory shock  Lactic acidosis resolved Empirical antimicrobial therapy P: Norepinephrine is off -Vancomycin stopped 11/19, plan to continue 10 days total cefepime (through 11/22)  BLE acute DVT Bilateral pressure ulcers Concern for decreased LE pulses earlier, arterial duplex without stenosis or decreased flow RV function okay so doubt PE P: -IV heparin as ordered -Lower extremity wound care, dressing changes  Hyperglycemia.  CBGs consistently 110s at this time -Change CBG check frequency to every shift   Goals of care 11/22 - I re-confirmed goals of care with mother - plan to transition to comfort care and compassionate extubation after all family has visited.    Lynnell Catalan, MD [facility] ICU Physician [facility] Critical Care  Pager: [phone] Mobile: [phone] After hours: [phone].    06/25/2020, 1:27 PM

## 2020-06-25 NOTE — Plan of Care (Signed)
  Problem: Clinical Measurements: Goal: Ability to maintain clinical measurements within normal limits will improve Outcome: Progressing Goal: Will remain free from infection Outcome: Progressing Goal: Cardiovascular complication will be avoided Outcome: Progressing   Problem: Nutrition: Goal: Adequate nutrition will be maintained Outcome: Progressing   Problem: Elimination: Goal: Will not experience complications related to urinary retention Outcome: Progressing   Problem: Pain Managment: Goal: General experience of comfort will improve Outcome: Progressing   Problem: Safety: Goal: Ability to remain free from injury will improve Outcome: Progressing   Problem: Skin Integrity: Goal: Risk for impaired skin integrity will decrease Outcome: Progressing   Problem: Respiratory: Goal: Ability to maintain a clear airway and adequate ventilation will improve Outcome: Progressing   Problem: Clinical Measurements: Goal: Dialysis access will remain free of complications Outcome: Progressing   Problem: Urinary Elimination: Goal: Progression of disease will be identified and treated Outcome: Progressing   Problem: Education: Goal: Knowledge of General Education information will improve Description: Including pain rating scale, medication(s)/side effects and non-pharmacologic comfort measures Outcome: Not Progressing   Problem: Health Behavior/Discharge Planning: Goal: Ability to manage health-related needs will improve Outcome: Not Progressing   Problem: Clinical Measurements: Goal: Diagnostic test results will improve Outcome: Not Progressing Goal: Respiratory complications will improve Outcome: Not Progressing   Problem: Activity: Goal: Risk for activity intolerance will decrease Outcome: Not Progressing   Problem: Elimination: Goal: Will not experience complications related to bowel motility Outcome: Not Progressing   Problem: Activity: Goal: Ability to tolerate  increased activity will improve Outcome: Not Progressing   Problem: Education: Goal: Knowledge of disease and its progression will improve Outcome: Not Progressing   Problem: Health Behavior/Discharge Planning: Goal: Ability to manage health-related needs will improve Outcome: Not Progressing   Problem: Clinical Measurements: Goal: Complications related to the disease process or treatment will be avoided or minimized Outcome: Not Progressing   Problem: Activity: Goal: Activity intolerance will improve Outcome: Not Progressing   Problem: Fluid Volume: Goal: Fluid volume balance will be maintained or improved Outcome: Not Progressing   Problem: Nutritional: Goal: Ability to make appropriate dietary choices will improve Outcome: Not Progressing   Problem: Respiratory: Goal: Respiratory symptoms related to disease process will be avoided Outcome: Not Progressing

## 2020-06-25 NOTE — Procedures (Signed)
Extubation Procedure Note  Patient Details:   Name: Burnard Harte DOB: [birth year] MRN: [id number]   Airway Documentation:    Vent end date: 06/25/20 Vent end time: 1304   Evaluation  Patient extubated per comfort care measures. RN at bedside.   Arrow Emmerich H Lanora Reveron 06/25/2020, 1:04 PM

## 2020-06-25 NOTE — Progress Notes (Signed)
[facility] Progress Note  Name: Derek Jennings MRN: [id number] DOB: 06/22/1986  Chief Complaint:  Found down at home  Subjective:  Primary team spoke with family on 11/22 and they elected to transition to comfort measures after all family has visited per charting.  They are not pursuing additional renal replacement therapy per critical care.  had 600 mL UOP over 11/22 charted.  Last HD was 11/21 with 2 kg UF charted.  Spoke with bedside RN who stated that plan is for comfort care later today - they are awaiting arrival of a family member from [place] before transitioning to comfort measures.    Review of systems:  Unable to obtain 2/2 mech ventilation   Intake/Output Summary (Last 24 hours) at 06/25/2020 0610 Last data filed at 06/25/2020 0600 Gross per 24 hour  Intake 2308.19 ml  Output 595 ml  Net 1713.19 ml    Vitals:  Vitals:   06/25/20 0400 06/25/20 0416 06/25/20 0500 06/25/20 0600  BP: 139/84  (!) 147/87 (!) 151/86  Pulse: 96  92 98  Resp: 19  18 14   Temp: 99.1 F (37.3 C)  99.1 F (37.3 C) 98.8 F (37.1 C)  TempSrc: Skin     SpO2: 99%  99% 100%  Weight:  95.8 kg    Height:         Physical Exam: General adult male in bed intubated  HEENT normocephalic atraumatic  Neck supple trachea midline Lungs course mechanical breath sounds  Heart tachycardia S1S2 Abdomen soft nontender nondistended Extremities 1+ edema  Neuro no continuous sedation running; eyes partially open and does not blink to threat Access right fem nontunneled   Medications reviewed   Labs:  BMP Latest Ref Rng & Units 06/25/2020 06/24/2020 06/24/2020  Glucose 70 - 99 mg/dL 06/26/2020) 962(X) 528(U)  BUN 6 - 20 mg/dL 132(G) 401(U) 272(Z)  Creatinine 0.61 - 1.24 mg/dL 366(Y) 4.03(K) 7.42(V)  Sodium 135 - 145 mmol/L 136 136 137  Potassium 3.5 - 5.1 mmol/L 5.3(H) 5.2(H) 5.0  Chloride 98 - 111 mmol/L 99 98 97(L)  CO2 22 - 32 mmol/L 17(L) 18(L) 19(L)  Calcium 8.9 - 10.3 mg/dL  8.3(L) 8.2(L) 8.5(L)     Assessment/Plan:   1. Acute kidney injury likely ATN in the setting of severe dehydration/hypotension,rhabdomyolysis after found down on the floor. UA without proteinuria.Kidney ultrasound consistent with increased echogenicity without acute finding.  Started CRRT for multiple electrolytes abnormalities including hyperkalemia.  CRRT discontinued on 06/19/2020.  Underwent first iHD dialysis 06/21/2020 - His family is pursuing comfort measures after all family members have visited - they are not pursuing additional renal replacement therapy   2.  Hyperkalemia improved with HD.  transitioned feeds to nepro while he is to continue feeds to temporize  3.Unresponsive/acute metabolic encephalopathy:MRI of the brain without hemorrhage or mass-effect however has hypoxic ischemic injury.   EEG revealing diffuse encephalopathy. Goals of care discussions per primary team   4.Acute respiratory failure with hypoxia: intubated. Managed byPCCM.  5.Hypernatremia: resolved  6 Metabolic Acidosis: secondary to AKI   7. Hyperphos - on renvela for now.  nepro as above to temporize  Prognosis is unfortunately grave.  Goals of care per primary team.  Please do not hesitate to contact me if I can be of any assistance.   06/23/2020, MD 06/25/2020  6:24 AM

## 2020-06-25 NOTE — Progress Notes (Signed)
ANTICOAGULATION CONSULT NOTE   Pharmacy Consult for heparin Indication: DVT   Patient Measurements: Height: 6\' 2"  (188 cm) Weight: 95.8 kg (211 lb 3.2 oz) IBW/kg (Calculated) : 82.2 Heparin Dosing Weight: 88kg   Labs: Recent Labs    06/23/20 0417 06/23/20 1617 06/23/20 1618 06/23/20 2115 06/23/20 2115 06/24/20 0445 06/24/20 1600 06/25/20 0427  HGB  --   --   --  9.0*  --   --   --   --   HCT  --   --   --  27.6*  --   --   --   --   PLT  --   --   --  301  --   --   --   --   LABPROT 14.7  --   --   --   --  15.1  --  14.0  INR 1.2  --   --   --   --  1.2  --  1.1  HEPARINUNFRC 0.62  --  0.50  --   --  0.51  --  0.48  CREATININE 5.28*   < >  --  4.23*   < > 4.70* 5.30* 5.76*   < > = values in this interval not displayed.    Estimated Creatinine Clearance: 21.4 mL/min (A) (by C-G formula based on SCr of 5.76 mg/dL (H)).   Assessment: 34 year old male found down for unknown length of time, minimally responsive by family. Originally thought to be in DIC, found to have new large bilateral DVTs noted on dopplers.  Pharmacy dosing heparin.   Heparin level is therapeutic at 0.48, on 1250 units/hr. No further bleeding noted by nursing. Will continue to monitor closely. Monitor plans for continued [facility] per CCM.   Goal of Therapy:  Heparin level 0.3-0.7 units/ml Monitor platelets by anticoagulation protocol: Yes   Plan:  - Continue heparin to 1250 units/h - Daily heparin level and CBC - Monitor for s/sx of bleed - Monitor plans to continue heparin therapy per CCM   [facility], PharmD, [facility] Pharmacy Resident [phone] 06/25/2020 7:34 AM

## 2020-06-25 NOTE — Progress Notes (Signed)
  Progress Note   Date: 06/17/2020  Patient Name: Derek Jennings        MRN#: [id number]   Clarification of the diagnosis of pressure ulcer(s): Left lateral lower ext,, left heel and right anterior lower extremity deep tissue unstageable wounds all present on admission.    Left lower ext  Left heel   Simonne Martinet ACNP-BC [facility] Pulmonary/Critical Care Pager # [phone] OR # [phone] if no answer

## 2020-06-26 MED ORDER — WHITE PETROLATUM EX OINT
TOPICAL_OINTMENT | CUTANEOUS | Status: AC
  Administered 2020-06-26: 1
  Filled 2020-06-26: qty 28.35

## 2020-06-26 NOTE — Progress Notes (Signed)
Pt appears comfortable, giving morphine and robinol as needed.

## 2020-06-26 NOTE — Progress Notes (Signed)
Pt has appeared comfortable today, morphine given 3 times today for pain. Changed bilateral leg dressings. No family at bedside today.

## 2020-06-26 NOTE — Plan of Care (Signed)
Patient has transitioned to comfort care.  Spoke with nursing.  He is lying in bed in no acute distress.   Nephrology will sign off.  Please do not hesitate to contact me if I can be of any assistance.    Estanislado Emms, MD 06/26/2020 11:46 AM

## 2020-06-26 NOTE — Progress Notes (Signed)
°   06/26/20 1515  Clinical Encounter Type  Visited With Patient  Visit Type Follow-up  Referral From Chaplain (Per Chaplain Stephanie Acre)  Consult/Referral To Chaplain  Chaplain visited patient. The patient's family was not present. Chaplain prayed at the patient's bedside.This note was prepared by Deneen Harts, M.Div..  For questions please contact by phone [phone].

## 2020-06-27 MED ORDER — ACETAMINOPHEN 325 MG PO TABS: 650.0000 mg | ORAL_TABLET | Freq: Four times a day (QID) | ORAL | Status: DC | PRN

## 2020-06-27 MED ORDER — DEXTROSE 5 % IV SOLN: INTRAVENOUS | Status: DC

## 2020-06-27 MED ORDER — GLYCOPYRROLATE 0.2 MG/ML IJ SOLN: 0.2000 mg | INTRAMUSCULAR | Status: DC | PRN

## 2020-06-27 MED ORDER — GLYCOPYRROLATE 0.2 MG/ML IJ SOLN
0.2000 mg | INTRAMUSCULAR | Status: DC | PRN
  Administered 2020-06-28: 0.2 mg via INTRAVENOUS
  Filled 2020-06-27: qty 1

## 2020-06-27 MED ORDER — MORPHINE SULFATE (PF) 2 MG/ML IV SOLN: 2.0000 mg | INTRAVENOUS | Status: DC | PRN

## 2020-06-27 MED ORDER — DIPHENHYDRAMINE HCL 50 MG/ML IJ SOLN: 25.0000 mg | INTRAMUSCULAR | Status: DC | PRN

## 2020-06-27 MED ORDER — GLYCOPYRROLATE 1 MG PO TABS
1.0000 mg | ORAL_TABLET | ORAL | Status: DC | PRN
  Filled 2020-06-27: qty 1

## 2020-06-27 MED ORDER — POLYVINYL ALCOHOL 1.4 % OP SOLN
1.0000 [drp] | Freq: Four times a day (QID) | OPHTHALMIC | Status: DC | PRN
  Filled 2020-06-27: qty 15

## 2020-06-27 MED ORDER — ACETAMINOPHEN 650 MG RE SUPP: 650.0000 mg | Freq: Four times a day (QID) | RECTAL | Status: DC | PRN

## 2020-06-27 MED ORDER — MORPHINE 100MG IN NS 100ML (1MG/ML) PREMIX INFUSION
0.0000 mg/h | INTRAVENOUS | Status: DC
  Administered 2020-06-27: 4 mg/h via INTRAVENOUS
  Administered 2020-06-27: 2 mg/h via INTRAVENOUS
  Filled 2020-06-27: qty 100

## 2020-06-27 MED ORDER — MORPHINE BOLUS VIA INFUSION
5.0000 mg | INTRAVENOUS | Status: DC | PRN
  Filled 2020-06-27: qty 5

## 2020-06-27 NOTE — Progress Notes (Signed)
Spoke with Mother Taiwan Talcott to give update.  Comfort care measures are being gien. Morphine for Pin given IV and it may be good time for family to visit if able to do so.  She states may be able to be here tomorrow 02-Jul-2020.

## 2020-06-27 NOTE — Progress Notes (Addendum)
   NAMEHillis Jennings, MRN:  [id number], DOB:  1985/12/12, LOS: 12 ADMISSION DATE:  19-Jun-2020, CONSULTATION DATE: 11/13 REFERRING MD:  Dr. Lynelle Doctor, CHIEF COMPLAINT: Acute renal failure  Brief History   34 year old male presents to ED via EMS after being found minimally responsive by family.  Last seen normal 1 week prior to admission.  Known history of depression and drug abuse.  PCCM consulted for further management and admission   Interim history/subjective:  Seen on 6N after palliative extubation. Tachypneic on my exam.  Objective   Blood pressure (!) 143/75, pulse (!) 118, temperature 100.1 F (37.8 C), resp. rate 19, height 6\' 2"  (1.88 m), weight 95.8 kg, SpO2 94 %.        Intake/Output Summary (Last 24 hours) at 06/27/2020 1114 Last data filed at 06/27/2020 1000 Gross per 24 hour  Intake --  Output 925 ml  Net -925 ml   Filed Weights   06/23/20 1940 06/24/20 0427 06/25/20 0416  Weight: 90.5 kg 91.2 kg 95.8 kg    Examination: Ill appearing man with shallow rapid inspirations Some rhythmic twitching of eyelids Lungs shallow effort Has cough/gag   Resolved Hospital Problem list   Hyperkalemia  Hypernatremia  Thrombocytopenia, Coagulopathy  Assessment & Plan:  Severe anoxic brain injury- family GoC turned to comfort - Start morphine drip titrated to patient comfort - Appreciate [facility] taking over care starting 06/11/2020 - Called and updated mother, all questions answered   06/30/20 MD PCCM

## 2020-07-03 NOTE — Progress Notes (Signed)
Pt noted to be pulseless, no breathing upon rounds at 6:38am, called Charge nurse, Celso to verify, pt prounced time of death at 6:40am. Morphine drip , 60 ml wasted witnessed by celso RN on sink,.

## 2020-07-03 NOTE — Death Summary Note (Signed)
DEATH SUMMARY   Patient Details  Name: Derek Jennings MRN: [id number] DOB: 08-23-1985  Admission/Discharge Information   Admit Date:  July 05, 2020  Date of Death: Date of Death: Jul 18, 2020  Time of Death: Time of Death: 0640  Length of Stay: 2022-12-04  Referring Physician: Pcp, No   Reason(s) for Hospitalization  Anoxic brain injury in setting of substance abuse  Diagnoses  Preliminary cause of death:  Secondary Diagnoses (including complications and co-morbidities):  Principal Problem:   Acute renal failure (ARF) (HCC) Active Problems:   Polysubstance abuse (HCC)   Encephalopathy, metabolic   Thrombocytopenia (HCC)   Acute respiratory insufficiency   Brief Hospital Course (including significant findings, care, treatment, and services provided and events leading to death)  [name] is a 34 year old male with past medical history significant depression and drug abuse who presented to the emergency department with altered mental status after being found minimally responsive by family. Per chart review patient was last seen normal 1 week prior to admission.  There is reportedly history of depression and drug abuse.  On arrival patient was being bagged due to severely depressed mentation with a GCS of 3.  EMS reports possible drug paraphernalia on site.  Urine toxicology positive for amphetamines.  Full laboratory evaluation pending at time of assessment, relevant reported significant lab work thus far included sodium 155, potassium 7.5, glucose 122, BUN greater than 130, creatinine 17, ionized calcium 0.85, white blood cell count 14.5, and INR 22.  Chest x-ray relatively unremarkable.  Given severe renal failure with a creatinine of 17 and hyperkalemia nephrology was consulted.  Patient will need emergent hemodialysis.  Also concern for severe rhabdomyolysis, CK-MB currently pending.  Additionally patient was electively intubated in the emergency department due to depressed GCS.  PCCM  consulted for further management and admission.  Further workup revealed diffuse anoxic brain injury.  Despite HD, patient never regained consciousness.  After discussion with family, he was allowed to pass away in peace.   Pertinent Labs and Studies  Significant Diagnostic Studies CT Head Wo Contrast  Result Date: July 05, 2020 CLINICAL DATA:  Head trauma.  Mental status change. EXAM: CT HEAD WITHOUT CONTRAST TECHNIQUE: Contiguous axial images were obtained from the base of the skull through the vertex without intravenous contrast. COMPARISON:  None. FINDINGS: Brain: Abnormal area of mixed attenuation, predominantly hypoattenuated with speckles of hyperattenuation within the right cerebellum measures 2.8 cm. There is no evidence of hydrocephalus or mass effect. The ventricular system is normal. Vascular: No hyperdense vessel or unexpected calcification. Skull: Normal. Negative for fracture or focal lesion. Sinuses/Orbits: Complete opacification of the right maxillary sinus. Partial opacification of the right ethmoid sinus. Normal appearance of the orbits. Other: Right parietal scalp hematoma. IMPRESSION: 1. Abnormal area of mixed attenuation, predominantly hypoattenuated with speckles of hyperattenuation within the right cerebellum measures 2.8 cm. This may represent an acute intraparenchymal hemorrhage, a hemorrhagic infarct, or potentially a hemorrhagic mass. Further evaluation with MRI of the brain may be considered. 2. Right parietal scalp hematoma. 3. Right maxillary and ethmoid sinusitis. Critical Value/emergent results were called by telephone at the time of interpretation on July 05, 2020 at 3:03 pm to provider Dr. Andris Baumann, who verbally acknowledged these results. Electronically Signed   By: Ted Mcalpine M.D.   On: 07-05-20 15:04   CT Cervical Spine Wo Contrast  Result Date: 2020/07/05 CLINICAL DATA:  Found unresponsive. EXAM: CT CERVICAL SPINE WITHOUT CONTRAST TECHNIQUE:  Multidetector CT imaging of the cervical spine was performed without intravenous contrast. Multiplanar CT  image reconstructions were also generated. COMPARISON:  None. FINDINGS: Alignment: Normal. Skull base and vertebrae: No acute fracture. No primary bone lesion or focal pathologic process. Soft tissues and spinal canal: No prevertebral fluid or swelling. No visible canal hematoma. Disc levels:  Normal. Upper chest: Negative. Other: Endotracheal tube and enteric catheter noted. IMPRESSION: No evidence of acute traumatic injury to the cervical spine. Electronically Signed   By: Ted Mcalpine M.D.   On: 06/17/2020 15:13   MR BRAIN WO CONTRAST  Result Date: 06/16/2020 CLINICAL DATA:  Suspected drug overdose.  Found unconscious. EXAM: MRI HEAD WITHOUT CONTRAST TECHNIQUE: Multiplanar, multiecho pulse sequences of the brain and surrounding structures were obtained without intravenous contrast. COMPARISON:  Head CT 06/26/2020 FINDINGS: Brain: There is diffuse white matter abnormality on diffusion-weighted imaging throughout both cerebral and cerebellar hemispheres. Edema is worst in the right cerebellar hemisphere. There is no extra-axial collection. No midline shift or other mass effect. Vascular: The major intracranial arterial and venous sinus flow voids are preserved. Skull and upper cervical spine: There is a small subgaleal hematoma at the scalp vertex. Sinuses/Orbits: Opacification of the right maxillary sinus. Normal orbits Other: None IMPRESSION: 1. Diffuse white matter abnormality on diffusion-weighted imaging throughout both cerebral and cerebellar hemispheres, most consistent with hypoxic ischemic injury. 2. No hemorrhage or mass effect. Electronically Signed   By: Deatra Robinson M.D.   On: 06/16/2020 00:42   US RENAL  Result Date: 06/20/2020 CLINICAL DATA:  Acute renal insufficiency EXAM: RENAL / URINARY TRACT ULTRASOUND COMPLETE COMPARISON:  None. FINDINGS: Right Kidney: Renal measurements:  10.6 x 4.9 x 5.8 cm = volume: 155.1 mL. Mild increased renal cortical echotexture. No mass or hydronephrosis. Left Kidney: Renal measurements: 10.1 x 4.9 x 5.0 cm = volume: 129.0 mL. Mild increased renal cortical echotexture. No mass or hydronephrosis. Bladder: Decompressed, Foley catheter in place. Other: None. IMPRESSION: 1. Mild bilateral increased renal cortical echotexture consistent with medical renal disease. Electronically Signed   By: Sharlet Salina M.D.   On: 06/27/2020 15:34   DG Chest Port 1 View  Result Date: 06/22/2020 CLINICAL DATA:  Respiratory failure, hypoxia. EXAM: PORTABLE CHEST 1 VIEW COMPARISON:  06/20/2020 and prior radiographs FINDINGS: An endotracheal tube with tip 4 cm above the carina, NG tube entering the stomach with tip off the field of view and LEFT IJ central venous catheter with tip overlying the UPPER SVC again noted. There is no evidence of focal airspace disease, pulmonary edema, suspicious pulmonary nodule/mass, pleural effusion, or pneumothorax. No acute bony abnormalities are identified. IMPRESSION: Unchanged exam. Electronically Signed   By: Harmon Pier M.D.   On: 06/22/2020 09:31   DG Chest Port 1 View  Result Date: 06/20/2020 CLINICAL DATA:  Respiration abnormal.  Endotracheal tube present. EXAM: PORTABLE CHEST 1 VIEW COMPARISON:  06/19/2020 FINDINGS: Endotracheal tube is 5.3 cm above the carina. Nasogastric tube extends into the abdomen but the tip is beyond the image. Stable apical lung scarring. Otherwise, the lungs are clear. Negative for a pneumothorax. Heart size is within normal limits and stable. Left jugular central line tip there is near the upper SVC. IMPRESSION: 1. Stable chest radiograph findings.  No focal lung disease. 2. Support apparatuses as described. Electronically Signed   By: Richarda Overlie M.D.   On: 06/20/2020 08:18   DG Chest Port 1 View  Result Date: 06/19/2020 CLINICAL DATA:  Hypoxia EXAM: PORTABLE CHEST 1 VIEW COMPARISON:  June 15, 2020 FINDINGS: Endotracheal tube tip is 5.5 cm above the carina.  Nasogastric tube tip and side port are below the diaphragm. Central catheter tip is at the junction of the left innominate vein and superior vena cava. No pneumothorax. There is apical pleural thickening bilaterally, stable. Lungs otherwise are clear. Heart size and pulmonary vascularity are normal. No adenopathy. No bone lesions. IMPRESSION: Tube and catheter positions as described without pneumothorax. No edema or airspace opacity. Heart size normal. Electronically Signed   By: Bretta Bang III M.D.   On: 06/19/2020 08:10   DG Chest Portable 1 View  Result Date: June 17, 2020 CLINICAL DATA:  Endotracheal tube placement, respiratory failure EXAM: PORTABLE CHEST 1 VIEW COMPARISON:  On June 17, 2020 at 10:44 a.m. FINDINGS: Left IJ line tip: Brachiocephalic vein confluence. Endotracheal tube tip is 4.5 cm above the carina and satisfactorily positioned. A nasogastric tube enters the stomach. The patient is rotated to the right on today's radiograph, reducing diagnostic sensitivity and specificity. No pneumothorax. The lungs appear clear. No blunting of the costophrenic angles. IMPRESSION: 1. Left IJ line tip: Brachiocephalic vein confluence. 2. Endotracheal tube tip 4.5 cm above the carina and satisfactorily positioned. Electronically Signed   By: Gaylyn Rong M.D.   On: 17-Jun-2020 13:15   DG Chest Port 1 View  Result Date: 06-17-2020 CLINICAL DATA:  Possible overdose.  Unresponsive. EXAM: PORTABLE CHEST 1 VIEW COMPARISON:  02/09/2014 FINDINGS: Patient is rotated to the right. Low volume film with mild vascular congestion. Subtle patchy airspace disease noted left base. Cardiopericardial silhouette is at upper limits of normal for size. Left IJ central line tip overlies the innominate vein confluence. No evidence for pneumothorax or pleural effusion. Gaseous distention of bowel in the left abdomen likely stomach and colon. IMPRESSION:  Subtle patchy airspace disease at the left base. Otherwise no acute cardiopulmonary findings. Electronically Signed   By: Kennith Center M.D.   On: 06-17-2020 11:57   EEG adult  Result Date: 06/17/2020 Charlsie Quest, MD     06/17/2020 12:11 PM Patient Name: Zyan Coby MRN: [id number] Epilepsy Attending: Charlsie Quest Referring Physician/Provider: Zenia Resides, NP Date: 06/17/2020 Duration: 24.24 minutes Patient history: 34 year old male with altered mental status.  EEG to evaluate for seizures. Level of alertness:  comatose AEDs during EEG study: None Technical aspects: This EEG study was done with scalp electrodes positioned according to the 10-20 International system of electrode placement. Electrical activity was acquired at a sampling rate of 500Hz  and reviewed with a high frequency filter of 70Hz  and a low frequency filter of 1Hz . EEG data were recorded continuously and digitally stored. Description: EEG showed continuous generalized rhythmic 2 to 3 Hz delta slowing.  Per documentation, patient was noted to be biting down onto during the EEG.  Concomitant EEG continued to show 2 to 3 Hz rhythmic delta slowing without any evolution as well as significant myogenic artifact.  Hyperventilation and photic stimulation were not performed.   Of note, EEG was technically difficult due to significant myogenic artifact. ABNORMALITY -Continuous rhythmic slow, generalized IMPRESSION: This technically difficult study is suggestive of severe diffuse encephalopathy, nonspecific etiology.  No seizures or epileptiform discharges were seen throughout the recording. Of note, patient was noted to be biting down on the tube during EEG without concomitant EEG change and was not consistent with seizure.   ECHOCARDIOGRAM COMPLETE  Result Date: 2020/06/17    ECHOCARDIOGRAM REPORT   Patient Name:   HILLARY SCHWEGLER Date of Exam: 06-17-20 Medical Rec #:  06/17/2020    Height:  74.0 in Accession #:     [id number]   Weight:       200.0 lb Date of Birth:  07-30-1986   BSA:          2.173 m Patient Age:    34 years     BP:           126/76 mmHg Patient Gender: M            HR:           91 bpm. Exam Location:  Inpatient Procedure: 2D Echo, Cardiac Doppler and Color Doppler Indications:    Dyspnea  History:        Patient has no prior history of Echocardiogram examinations.                 Risk Factors:Current Smoker. Polysubstance abuse.  Sonographer:    Ross Ludwig RDCS (AE) Referring Phys: [phone] [facility] DAVIS  Sonographer Comments: Technically difficult study due to poor echo windows, suboptimal parasternal window, suboptimal apical window and echo performed with patient supine and on artificial respirator. Image acquisition challenging due to respiratory motion. IMPRESSIONS  1. Left ventricular ejection fraction, by estimation, is 60 to 65%. The left ventricle has normal function. The left ventricle has no regional wall motion abnormalities. There is mild left ventricular hypertrophy. Left ventricular diastolic parameters are consistent with Grade I diastolic dysfunction (impaired relaxation).  2. Right ventricular systolic function is normal. The right ventricular size is normal.  3. The mitral valve is normal in structure. No evidence of mitral valve regurgitation. No evidence of mitral stenosis.  4. The aortic valve has an indeterminant number of cusps. Aortic valve regurgitation is not visualized. No aortic stenosis is present.  5. Aortic dilatation noted. There is moderate dilatation of the ascending aorta, measuring 45 mm.  6. The inferior vena cava is normal in size with greater than 50% respiratory variability, suggesting right atrial pressure of 3 mmHg. FINDINGS  Left Ventricle: Left ventricular ejection fraction, by estimation, is 60 to 65%. The left ventricle has normal function. The left ventricle has no regional wall motion abnormalities. The left ventricular internal cavity size was normal in size.  There is  mild left ventricular hypertrophy. Left ventricular diastolic parameters are consistent with Grade I diastolic dysfunction (impaired relaxation). Right Ventricle: The right ventricular size is normal. Right ventricular systolic function is normal. Left Atrium: Left atrial size was normal in size. Right Atrium: Right atrial size was normal in size. Pericardium: There is no evidence of pericardial effusion. Mitral Valve: The mitral valve is normal in structure. No evidence of mitral valve regurgitation. No evidence of mitral valve stenosis. Tricuspid Valve: The tricuspid valve is normal in structure. Tricuspid valve regurgitation is not demonstrated. No evidence of tricuspid stenosis. Aortic Valve: The aortic valve has an indeterminant number of cusps. Aortic valve regurgitation is not visualized. No aortic stenosis is present. Aortic valve mean gradient measures 3.0 mmHg. Aortic valve peak gradient measures 4.3 mmHg. Aortic valve area, by VTI measures 2.16 cm. Pulmonic Valve: The pulmonic valve was not well visualized. Pulmonic valve regurgitation is not visualized. No evidence of pulmonic stenosis. Aorta: Aortic dilatation noted. There is moderate dilatation of the ascending aorta, measuring 45 mm. Venous: The inferior vena cava is normal in size with greater than 50% respiratory variability, suggesting right atrial pressure of 3 mmHg.   LEFT VENTRICLE PLAX 2D LVIDd:         3.00 cm  Diastology LVIDs:  1.80 cm  LV e' medial:    6.85 cm/s LV PW:         1.10 cm  LV E/e' medial:  8.4 LV IVS:        1.30 cm  LV e' lateral:   5.87 cm/s LVOT diam:     1.80 cm  LV E/e' lateral: 9.8 LV SV:         37 LV SV Index:   17 LVOT Area:     2.54 cm  RIGHT VENTRICLE             IVC RV S prime:     16.10 cm/s  IVC diam: 1.80 cm TAPSE (M-mode): 1.8 cm LEFT ATRIUM           Index      RIGHT ATRIUM          Index LA diam:      1.20 cm 0.55 cm/m RA Area:     9.17 cm LA Vol (A4C): 11.0 ml 5.06 ml/m RA Volume:    15.50 ml 7.13 ml/m  AORTIC VALVE AV Area (Vmax):    2.20 cm AV Area (Vmean):   2.05 cm AV Area (VTI):     2.16 cm AV Vmax:           104.00 cm/s AV Vmean:          86.800 cm/s AV VTI:            0.173 m AV Peak Grad:      4.3 mmHg AV Mean Grad:      3.0 mmHg LVOT Vmax:         90.10 cm/s LVOT Vmean:        69.900 cm/s LVOT VTI:          0.147 m LVOT/AV VTI ratio: 0.85  AORTA Ao Root diam: 3.00 cm MITRAL VALVE MV Area (PHT): 3.72 cm    SHUNTS MV Decel Time: 204 msec    Systemic VTI:  0.15 m MV E velocity: 57.40 cm/s  Systemic Diam: 1.80 cm MV A velocity: 62.60 cm/s MV E/A ratio:  0.92 Olga Millers MD Electronically signed by Olga Millers MD Signature Date/Time: 06/05/2020/4:56:42 PM    Final    VAS [place] LOWER EXTREMITY ARTERIAL DUPLEX  Result Date: 06/17/2020 LOWER EXTREMITY ARTERIAL DUPLEX STUDY Indications: Poor pedal pulses bilaterally. Bilateral lower extremity DVT.  Current ABI: Not obtained due to DVT Limitations: Patient intubated; unable to cooperate. Bandaging in right groin              and bilateral ankles Comparison Study: No prior study Performing Technologist: Gertie Fey MHA, RDMS, RVT, RDCS  Examination Guidelines: A complete evaluation includes B-mode imaging, spectral Doppler, color Doppler, and power Doppler as needed of all accessible portions of each vessel. Bilateral testing is considered an integral part of a complete examination. Limited examinations for reoccurring indications may be performed as noted.  +-----------+--------+-----+--------+----------+--------+ RIGHT      PSV cm/sRatioStenosisWaveform  Comments +-----------+--------+-----+--------+----------+--------+ DFA        174                  triphasic          +-----------+--------+-----+--------+----------+--------+ SFA Prox   142                  monophasic         +-----------+--------+-----+--------+----------+--------+ SFA Mid    128  triphasic           +-----------+--------+-----+--------+----------+--------+ SFA Distal 78                   triphasic          +-----------+--------+-----+--------+----------+--------+ POP Prox   71                   triphasic          +-----------+--------+-----+--------+----------+--------+ TP Trunk   55                   triphasic          +-----------+--------+-----+--------+----------+--------+ ATA Distal 23                   monophasic         +-----------+--------+-----+--------+----------+--------+ PTA Distal 18                   monophasic         +-----------+--------+-----+--------+----------+--------+ PERO Distal24                   monophasic         +-----------+--------+-----+--------+----------+--------+  +-----------+--------+-----+--------+----------+--------+ LEFT       PSV cm/sRatioStenosisWaveform  Comments +-----------+--------+-----+--------+----------+--------+ CFA Distal 100                  monophasic         +-----------+--------+-----+--------+----------+--------+ DFA        160                  triphasic          +-----------+--------+-----+--------+----------+--------+ SFA Prox   91                   monophasic         +-----------+--------+-----+--------+----------+--------+ SFA Mid    161                  monophasic         +-----------+--------+-----+--------+----------+--------+ SFA Distal 109                  monophasic         +-----------+--------+-----+--------+----------+--------+ POP Distal 53                   monophasic         +-----------+--------+-----+--------+----------+--------+ TP Trunk   67                   monophasic         +-----------+--------+-----+--------+----------+--------+ ATA Distal 11                   monophasic         +-----------+--------+-----+--------+----------+--------+ PTA Distal 23                   monophasic          +-----------+--------+-----+--------+----------+--------+ PERO Distal19                   monophasic         +-----------+--------+-----+--------+----------+--------+  Summary: Right: No obvious evidence of hemodynamically significant stenosis with distal dampened of flow. Left: No obvious evidence of hemodynamically significant stenosis with distal dampened of flow.  See table(s) above for measurements and observations. Electronically signed by Lemar Livings MD on 06/17/2020 at 4:30:37 PM.    Final    VAS [place] LOWER EXTREMITY VENOUS (DVT)  Result  Date: 06/17/2020  Lower Venous DVT Study Indications: Edema, and arrest.  Risk Factors: Polysubstance abuse. Limitations: Ventilation. Comparison Study: No prior study on file Performing Technologist: Sherren Kerns RVS  Examination Guidelines: A complete evaluation includes B-mode imaging, spectral Doppler, color Doppler, and power Doppler as needed of all accessible portions of each vessel. Bilateral testing is considered an integral part of a complete examination. Limited examinations for reoccurring indications may be performed as noted. The reflux portion of the exam is performed with the patient in reverse Trendelenburg.  +---------+---------------+---------+-----------+----------+--------------+ RIGHT    CompressibilityPhasicitySpontaneityPropertiesThrombus Aging +---------+---------------+---------+-----------+----------+--------------+ CFV      Full           Yes      Yes                                 +---------+---------------+---------+-----------+----------+--------------+ SFJ      Full                                                        +---------+---------------+---------+-----------+----------+--------------+ FV Prox  Partial                                      Acute          +---------+---------------+---------+-----------+----------+--------------+ FV Mid   None                                         Acute           +---------+---------------+---------+-----------+----------+--------------+ FV DistalNone                                         Acute          +---------+---------------+---------+-----------+----------+--------------+ PFV      Full                                                        +---------+---------------+---------+-----------+----------+--------------+ POP      None           No       No                   Acute          +---------+---------------+---------+-----------+----------+--------------+ PTV      None                                         Acute          +---------+---------------+---------+-----------+----------+--------------+ PERO     None  Acute          +---------+---------------+---------+-----------+----------+--------------+   +---------+---------------+---------+-----------+----------+--------------+ LEFT     CompressibilityPhasicitySpontaneityPropertiesThrombus Aging +---------+---------------+---------+-----------+----------+--------------+ CFV      Full           Yes      Yes                                 +---------+---------------+---------+-----------+----------+--------------+ SFJ      Full                                                        +---------+---------------+---------+-----------+----------+--------------+ FV Prox  None                                         Acute          +---------+---------------+---------+-----------+----------+--------------+ FV Mid   None                                         Acute          +---------+---------------+---------+-----------+----------+--------------+ FV DistalNone                                         Acute          +---------+---------------+---------+-----------+----------+--------------+ PFV      None                                         Acute           +---------+---------------+---------+-----------+----------+--------------+ POP      None           No       No                   Acute          +---------+---------------+---------+-----------+----------+--------------+ PTV      None                                         Acute          +---------+---------------+---------+-----------+----------+--------------+ PERO     None                                         Acute          +---------+---------------+---------+-----------+----------+--------------+     Summary: RIGHT: - Findings consistent with acute deep vein thrombosis involving the right femoral vein, right popliteal vein, right posterior tibial veins, and right peroneal veins.  LEFT: - Findings consistent with acute deep vein thrombosis involving the left femoral vein, left proximal profunda vein, left popliteal vein, left posterior tibial veins, and left peroneal veins.  *See table(s) above for measurements  and observations. Electronically signed by Lemar Livings MD on 06/17/2020 at 4:34:07 PM.    Final     Microbiology Recent Results (from the past 240 hour(s))  Culture, respiratory (non-expectorated)     Status: None   Collection Time: 06/19/20  8:57 AM   Specimen: Tracheal Aspirate; Respiratory  Result Value Ref Range Status   Specimen Description TRACHEAL ASPIRATE  Final   Special Requests NONE  Final   Gram Stain   Final    RARE WBC PRESENT,BOTH PMN AND MONONUCLEAR RARE YEAST Performed at [facility], [address], [place], [place] [address]    Culture MODERATE CANDIDA ALBICANS  Final   Report Status 06/22/2020 FINAL  Final  Culture, respiratory (non-expectorated)     Status: None   Collection Time: 06/20/20 12:55 AM   Specimen: Tracheal Aspirate; Respiratory  Result Value Ref Range Status   Specimen Description TRACHEAL ASPIRATE  Final   Special Requests NONE  Final   Gram Stain   Final    ABUNDANT WBC PRESENT, PREDOMINANTLY PMN MODERATE  YEAST Performed at [facility], [address], [place], [place] [address]    Culture FEW CANDIDA ALBICANS  Final   Report Status 06/22/2020 FINAL  Final  Culture, blood (routine x 2)     Status: None   Collection Time: 06/20/20  3:51 AM   Specimen: BLOOD LEFT ARM  Result Value Ref Range Status   Specimen Description BLOOD LEFT ARM  Final   Special Requests   Final    BOTTLES DRAWN AEROBIC AND ANAEROBIC Blood Culture adequate volume   Culture   Final    NO GROWTH 5 DAYS Performed at [facility], [address] [place], [place] [address]    Report Status 06/25/2020 FINAL  Final  Culture, blood (routine x 2)     Status: None   Collection Time: 06/20/20  4:02 AM   Specimen: BLOOD LEFT HAND  Result Value Ref Range Status   Specimen Description BLOOD LEFT HAND  Final   Special Requests   Final    BOTTLES DRAWN AEROBIC AND ANAEROBIC Blood Culture adequate volume   Culture   Final    NO GROWTH 5 DAYS Performed at [facility], [address], [place], [place] [address]    Report Status 06/25/2020 FINAL  Final    Lab Basic Metabolic Panel: Recent Labs  Lab 06/23/20 0417 06/23/20 0417 06/23/20 1617 06/23/20 2115 06/24/20 0445 06/24/20 1600 06/25/20 0427  NA 134*   < > 132* 136 137 136 136  K 5.6*   < > 5.5* 4.7 5.0 5.2* 5.3*  CL 98   < > 97* 98 97* 98 99  CO2 16*   < > 15* 20* 19* 18* 17*  GLUCOSE 108*   < > 112* 125* 119* 120* 121*  BUN 172*   < > 197* 127* 151* 171* 196*  CREATININE 5.28*   < > 5.95* 4.23* 4.70* 5.30* 5.76*  CALCIUM 8.6*   < > 8.3* 8.6* 8.5* 8.2* 8.3*  MG 2.4  --   --   --  2.5*  --  2.6*  PHOS 10.1*   < > 11.2* 6.8* 9.7* 10.7* 11.8*   < > = values in this interval not displayed.   Liver Function Tests: Recent Labs  Lab 06/23/20 1617 06/23/20 2115 06/24/20 0445 06/24/20 1600 06/25/20 0427  ALBUMIN 1.7* 1.8* 1.6* 1.7* 1.7*   No results for input(s): LIPASE, AMYLASE in  the last 168 hours. No results for input(s): AMMONIA in  the last 168 hours. CBC: Recent Labs  Lab 06/22/20 0432 06/23/20 2115  WBC 19.4* 14.2*  NEUTROABS 13.9*  --   HGB 9.3* 9.0*  HCT 28.2* 27.6*  MCV 93.7 93.6  PLT 279 301   Cardiac Enzymes: No results for input(s): CKTOTAL, CKMB, CKMBINDEX, TROPONINI in the last 168 hours. Sepsis Labs: Recent Labs  Lab 06/22/20 0432 06/23/20 2115  WBC 19.4* 14.2*      Lorin GlassDaniel C Talia Hoheisel 06/26/2020, 7:44 AM

## 2020-07-03 DEATH — deceased
